# Patient Record
Sex: Male | Born: 1950
Health system: Southern US, Community
[De-identification: ages and names within clinical notes are randomized; demographics above are authoritative.]

## PROBLEM LIST (undated history)

## (undated) DIAGNOSIS — Z8719 Personal history of other diseases of the digestive system: Secondary | ICD-10-CM

## (undated) DIAGNOSIS — H269 Unspecified cataract: Secondary | ICD-10-CM

## (undated) DIAGNOSIS — R7303 Prediabetes: Secondary | ICD-10-CM

## (undated) DIAGNOSIS — N529 Male erectile dysfunction, unspecified: Secondary | ICD-10-CM

## (undated) DIAGNOSIS — Z87438 Personal history of other diseases of male genital organs: Secondary | ICD-10-CM

## (undated) DIAGNOSIS — K222 Esophageal obstruction: Secondary | ICD-10-CM

## (undated) DIAGNOSIS — D126 Benign neoplasm of colon, unspecified: Secondary | ICD-10-CM

## (undated) DIAGNOSIS — R7989 Other specified abnormal findings of blood chemistry: Secondary | ICD-10-CM

## (undated) DIAGNOSIS — R0602 Shortness of breath: Secondary | ICD-10-CM

## (undated) DIAGNOSIS — K219 Gastro-esophageal reflux disease without esophagitis: Secondary | ICD-10-CM

## (undated) DIAGNOSIS — E785 Hyperlipidemia, unspecified: Secondary | ICD-10-CM

## (undated) HISTORY — PX: ESOPHAGEAL DILATION: SHX303

## (undated) HISTORY — DX: Prediabetes: R73.03

## (undated) HISTORY — DX: Esophageal obstruction: K22.2

## (undated) HISTORY — DX: Other specified abnormal findings of blood chemistry: R79.89

## (undated) HISTORY — DX: Gastro-esophageal reflux disease without esophagitis: K21.9

## (undated) HISTORY — DX: Benign neoplasm of colon, unspecified: D12.6

## (undated) HISTORY — DX: Hyperlipidemia, unspecified: E78.5

## (undated) HISTORY — DX: Unspecified cataract: H26.9

## (undated) HISTORY — PX: CHOLECYSTECTOMY: SHX55

## (undated) HISTORY — DX: Personal history of other diseases of male genital organs: Z87.438

## (undated) HISTORY — DX: Personal history of other diseases of the digestive system: Z87.19

## (undated) HISTORY — DX: Male erectile dysfunction, unspecified: N52.9

---

## 1987-01-11 ENCOUNTER — Encounter: Payer: Self-pay | Admitting: Family Medicine

## 1987-01-11 LAB — CONVERTED CEMR LAB
Blood Glucose, Fasting: 96 mg/dL
WBC, blood: 5.6 10*3/uL

## 1988-02-14 ENCOUNTER — Encounter: Payer: Self-pay | Admitting: Family Medicine

## 1992-06-18 HISTORY — PX: BACK SURGERY: SHX140

## 1993-09-25 ENCOUNTER — Encounter: Payer: Self-pay | Admitting: Family Medicine

## 1993-09-25 LAB — CONVERTED CEMR LAB: PSA: 0.2 ng/mL

## 1996-12-27 HISTORY — PX: ESOPHAGOGASTRODUODENOSCOPY: SHX1529

## 1998-12-03 ENCOUNTER — Emergency Department (HOSPITAL_COMMUNITY): Admission: EM | Admit: 1998-12-03 | Discharge: 1998-12-03 | Payer: Self-pay | Admitting: Emergency Medicine

## 1998-12-05 ENCOUNTER — Encounter: Payer: Self-pay | Admitting: Family Medicine

## 2002-07-01 ENCOUNTER — Encounter: Payer: Self-pay | Admitting: Family Medicine

## 2002-07-01 LAB — CONVERTED CEMR LAB
Blood Glucose, Fasting: 97 mg/dL
PSA: 0.1 ng/mL
TSH: 3.83 microintl units/mL

## 2003-06-19 HISTORY — PX: COLONOSCOPY: SHX174

## 2003-10-21 ENCOUNTER — Ambulatory Visit (HOSPITAL_COMMUNITY): Admission: RE | Admit: 2003-10-21 | Discharge: 2003-10-21 | Payer: Self-pay | Admitting: Gastroenterology

## 2003-10-21 LAB — HM COLONOSCOPY

## 2004-05-04 ENCOUNTER — Ambulatory Visit: Payer: Self-pay | Admitting: Internal Medicine

## 2004-06-06 ENCOUNTER — Ambulatory Visit: Payer: Self-pay | Admitting: Family Medicine

## 2005-04-25 ENCOUNTER — Ambulatory Visit: Payer: Self-pay | Admitting: Family Medicine

## 2005-05-14 ENCOUNTER — Ambulatory Visit: Payer: Self-pay | Admitting: Family Medicine

## 2005-09-18 ENCOUNTER — Ambulatory Visit: Payer: Self-pay | Admitting: Gastroenterology

## 2006-01-30 ENCOUNTER — Ambulatory Visit: Payer: Self-pay | Admitting: Family Medicine

## 2006-08-15 ENCOUNTER — Ambulatory Visit: Payer: Self-pay | Admitting: Gastroenterology

## 2006-08-20 ENCOUNTER — Ambulatory Visit: Payer: Self-pay | Admitting: Family Medicine

## 2006-09-12 ENCOUNTER — Ambulatory Visit: Payer: Self-pay | Admitting: Family Medicine

## 2006-09-13 ENCOUNTER — Ambulatory Visit: Payer: Self-pay | Admitting: Family Medicine

## 2006-09-16 ENCOUNTER — Ambulatory Visit: Payer: Self-pay | Admitting: Family Medicine

## 2007-07-07 ENCOUNTER — Telehealth (INDEPENDENT_AMBULATORY_CARE_PROVIDER_SITE_OTHER): Payer: Self-pay | Admitting: *Deleted

## 2007-07-08 ENCOUNTER — Ambulatory Visit: Payer: Self-pay | Admitting: Family Medicine

## 2007-11-07 ENCOUNTER — Encounter: Payer: Self-pay | Admitting: Family Medicine

## 2007-11-07 DIAGNOSIS — N529 Male erectile dysfunction, unspecified: Secondary | ICD-10-CM | POA: Insufficient documentation

## 2007-11-07 DIAGNOSIS — F528 Other sexual dysfunction not due to a substance or known physiological condition: Secondary | ICD-10-CM | POA: Insufficient documentation

## 2007-11-07 DIAGNOSIS — Z87448 Personal history of other diseases of urinary system: Secondary | ICD-10-CM | POA: Insufficient documentation

## 2007-11-18 ENCOUNTER — Ambulatory Visit: Payer: Self-pay | Admitting: Family Medicine

## 2007-11-18 LAB — CONVERTED CEMR LAB
ALT: 22 units/L (ref 0–53)
CO2: 28 meq/L (ref 19–32)
Calcium: 9.1 mg/dL (ref 8.4–10.5)
Creatinine, Ser: 1.1 mg/dL (ref 0.4–1.5)
HDL: 40.2 mg/dL (ref >39.0)
LDL Cholesterol: 126 mg/dL — ABNORMAL HIGH (ref 0–99)
PSA: 0.15 ng/mL (ref 0.10–4.00)
Total Bilirubin: 0.9 mg/dL (ref 0.3–1.2)
Total CHOL/HDL Ratio: 4.6
Triglycerides: 101 mg/dL (ref 0–149)
VLDL: 20 mg/dL (ref 0–40)

## 2007-11-20 ENCOUNTER — Ambulatory Visit: Payer: Self-pay | Admitting: Family Medicine

## 2007-11-20 DIAGNOSIS — K219 Gastro-esophageal reflux disease without esophagitis: Secondary | ICD-10-CM | POA: Insufficient documentation

## 2007-11-20 DIAGNOSIS — D485 Neoplasm of uncertain behavior of skin: Secondary | ICD-10-CM | POA: Insufficient documentation

## 2008-04-15 ENCOUNTER — Ambulatory Visit: Payer: Self-pay | Admitting: Family Medicine

## 2008-04-21 ENCOUNTER — Telehealth: Payer: Self-pay | Admitting: Family Medicine

## 2008-09-14 ENCOUNTER — Telehealth: Payer: Self-pay | Admitting: Family Medicine

## 2009-05-19 ENCOUNTER — Encounter: Payer: Self-pay | Admitting: Family Medicine

## 2009-06-13 ENCOUNTER — Ambulatory Visit: Payer: Self-pay | Admitting: Family Medicine

## 2009-06-14 ENCOUNTER — Telehealth: Payer: Self-pay | Admitting: Family Medicine

## 2009-07-21 ENCOUNTER — Ambulatory Visit: Payer: Self-pay | Admitting: Family Medicine

## 2009-07-26 ENCOUNTER — Telehealth: Payer: Self-pay | Admitting: Family Medicine

## 2009-09-09 ENCOUNTER — Telehealth: Payer: Self-pay | Admitting: Gastroenterology

## 2009-09-15 ENCOUNTER — Telehealth: Payer: Self-pay | Admitting: Family Medicine

## 2010-01-18 ENCOUNTER — Encounter (INDEPENDENT_AMBULATORY_CARE_PROVIDER_SITE_OTHER): Payer: Self-pay | Admitting: *Deleted

## 2010-01-26 ENCOUNTER — Ambulatory Visit: Payer: Self-pay | Admitting: Internal Medicine

## 2010-01-26 DIAGNOSIS — E663 Overweight: Secondary | ICD-10-CM | POA: Insufficient documentation

## 2010-01-27 LAB — CONVERTED CEMR LAB
CO2: 30 meq/L (ref 19–32)
Calcium: 9 mg/dL (ref 8.4–10.5)
GFR calc non Af Amer: 78.03 mL/min (ref >60)
HDL: 50 mg/dL (ref >39.00)
PSA: 0.15 ng/mL (ref 0.10–4.00)
Sodium: 140 meq/L (ref 135–145)
Triglycerides: 121 mg/dL (ref 0.0–149.0)

## 2010-02-09 ENCOUNTER — Ambulatory Visit: Payer: Self-pay | Admitting: Internal Medicine

## 2010-02-09 DIAGNOSIS — N4889 Other specified disorders of penis: Secondary | ICD-10-CM | POA: Insufficient documentation

## 2010-07-20 NOTE — Assessment & Plan Note (Signed)
Summary: CHEST COLD  CYD   Vital Signs:  Patient profile:   60 year old male Weight:      220.75 pounds O2 Sat:      95 % on Room air Temp:     98.1 degrees F oral Pulse rate:   80 / minute Pulse rhythm:   regular BP sitting:   120 / 82  (left arm) Cuff size:   large  Vitals Entered By: Sydell Axon LPN (July 21, 2009 12:26 PM)  O2 Flow:  Room air CC: Productive cough/white, chest congestion and some SOB   History of Present Illness: Pt he for ST since Sun. He has had mild fever Mon, no chills. Throat hurts with coughing and will eventually make head hurt with coughing. His head hurts after the coughing in the temples. He denies ear pain but has constant ringing intead of occas. Hre has had no rhinitis, no nasal congestion except in the morning. Cough occas in spells since initially. He has taken Tyl and generic Mucinex. He is sleeping ok, wakes up once at night.  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 Take One By Mouth 1 Hour Prior To Intercourse 2)  Nexium 40 Mg  Cpdr (Esomeprazole Magnesium) .Marland Kitchen.. 1 Daily 3)  Multivitamins   Tabs (Multiple Vitamin) .Marland Kitchen.. 1 Daily By Mouth 4)  Promethazine Hcl 25 Mg Tabs (Promethazine Hcl) .... One Tab By Mouth Every 6 Hrs As Needed Nausea.  Allergies: No Known Drug Allergies  Physical Exam  General:  Well-developed,well-nourished,in mild  distress due to what looks to be mild dehydration; alert,appropriate and cooperative throughout examination, congested. Head:  Normocephalic and atraumatic without obvious abnormalities. No apparent alopecia or balding. Sinuses NT. Eyes:  Conjunctiva clear  bilaterally.  Ears:  External ear exam shows no significant lesions or deformities.  Otoscopic examination reveals clear canals, tympanic membranes are intact bilaterally without bulging, retraction, inflammation or discharge. Hearing is grossly normal bilaterally. Nose:  no airflow obstruction, mucosal erythema, and mucosal edema.    Mouth:  no exudates and pharyngeal erythema.  Reasonable mucous membr moistness. Slight white PND. Neck:  No deformities, masses, or tenderness noted. Lungs:  moist harsh cough with crackles throughout, worst in the bases... no wheezes.   Heart:  Normal rate and regular rhythm. S1 and S2 normal without gallop, murmur, click, rub or other extra sounds.   Impression & Recommendations:  Problem # 1:  BRONCHITIS- ACUTE (ICD-466.0) Assessment New  See instructions. His updated medication list for this problem includes:    Guaifenesin 400 Mg Tabs (Guaifenesin) .Marland Kitchen... Take 2 by mouth in am, 1 in the afternoon and 2 at night    Zithromax Z-pak 250 Mg Tabs (Azithromycin) .Marland Kitchen... As dir    Tessalon 200 Mg Caps (Benzonatate) ..... One tab by mouth three times a day  Take antibiotics and other medications as directed. Encouraged to push clear liquids, get enough rest, and take acetaminophen as needed. To be seen in 5-7 days if no improvement, sooner if worse.  Complete Medication List: 1)  Cialis 20 Mg Tabs (Tadalafil) .... Take one by mouth 1 hour prior to intercourse 2)  Nexium 40  Mg Cpdr (Esomeprazole magnesium) .Marland Kitchen.. 1 daily 3)  Multivitamins Tabs (Multiple vitamin) .Marland Kitchen.. 1 daily by mouth 4)  Guaifenesin 400 Mg Tabs (Guaifenesin) .... Take 2 by mouth in am, 1 in the afternoon and 2 at night 5)  Zithromax Z-pak 250 Mg Tabs (Azithromycin) .... As dir 6)  Tessalon 200 Mg Caps (Benzonatate) .... One tab by mouth three times a day   Patient Instructions: 1)  Take Zithromax. 2)  Take Guaifenesin by [facility] and getting MUCOUS RELIEF  EXPECTORANT (400mg ), take 11/2 tabs by mouth AM and NOON. 3)  Drink lots of fluids anytime taking Guaifenesin.  4)  Lozenge all day. Prescriptions: TESSALON 200 MG CAPS (BENZONATATE) one tab by mouth three times a day  #30 x 0   Entered and Authorized by:   Shaune Leeks MD   Signed by:   Shaune Leeks MD on 07/21/2009   Method used:   Electronically to        [facility]. [address] [phone]* (retail)       [address] [place], [place]  [id number]       Ph: [phone]       Fax: [phone]   RxID:   [id number] ZITHROMAX Z-PAK 250 MG TABS (AZITHROMYCIN) as dir  #1 x 0   Entered and Authorized by:   Shaune Leeks MD   Signed by:   Shaune Leeks MD on 07/21/2009   Method used:   Electronically to        [facility]. [address] [phone]* (retail)       [address] [place], [place]  [address]       Ph: [phone]       Fax: [phone]   RxID:   [id number]   Current Allergies (reviewed today): No known allergies

## 2010-07-20 NOTE — Assessment & Plan Note (Signed)
Summary: CPX/DR SCHALLER'S PT/CLE   Vital Signs:  Patient profile:   60 year old male Height:      74 inches Weight:      220 pounds BMI:     28.35 Temp:     98.3 degrees F oral Pulse rate:   68 / minute Pulse rhythm:   regular BP sitting:   104 / 68  (left arm) Cuff size:   large  Vitals Entered By: Selena Batten Dance CMA (AAMA) (January 26, 2010 8:20 AM) CC: CPx   History of Present Illness: CC: CPE  no concerns today.  1. GERD with h/o hiatal hernia - on nexium, controls very well.  Does have episode maybe twice a year where his stomache feels full, improves with glass of water or antacid.  2. ED - cialis as needed, doing well with this.  3. preventative - UTD on colonosocpy and tetanus, due for prostate check (always normal).  Preventive Screening-Counseling & Management  Alcohol-Tobacco     Alcohol drinks/day: 0     Smoking Status: quit     Cans of tobacco/week: used to smokeless  Caffeine-Diet-Exercise     Caffeine use/day: <1cup/day      Drug Use:  never.        Blood Transfusions:  no.    Current Medications (verified): 1)  Cialis 20 Mg  Tabs (Tadalafil) .... Take One By Mouth 1 Hour Prior To Intercourse 2)  Nexium 40 Mg  Cpdr (Esomeprazole Magnesium) .Marland Kitchen.. 1 Daily 3)  Multivitamins   Tabs (Multiple Vitamin) .Marland Kitchen.. 1 Daily By Mouth  Allergies (verified): No Known Drug Allergies  Past History:  Past medical, surgical, family and social histories (including risk factors) reviewed for relevance to current acute and chronic problems.  Past Medical History: ED GERD h/o prostatitis in past  Past Surgical History: Reviewed history from 11/20/2007 and no changes required. HOSP TESTICULAR TORSION NO ORCHIOPEXY  CHILDHOOD BACK SURGERY, LUMBAR AREA  1994 EGD-- HIATAL HERNIA:(12/28/1986) EGD ESOPH STRICTURE, DILATED X 3 LAST 1993 EGD-- GERD--STRICTURE DILATED DUODENITIS:(10/06/2003) COLONOSCOPY  --NORMAL:(10/21/2003)     10 yrs  Family History: Reviewed history  from 11/20/2007 and no changes required. Father:ALIVE  83  BLEEDING ULCERS//CABG X 3 VESSELS MI X2  Mother: ALIVE  17 ARRYTHMIA AORTIC VALVE DISEASE // GERD  BROTHER A 57 SISTER A 52 Sister A 48 CV: + FATHER  HBP: NEGATIVE DM: NEGATIVE GOUT ARTHRITIS: PROSTATE CANCER:NEGATIVE BREAST/OVARIAN/UTERINE CANCER: NEGATIVE COLON CANCER: NEGATIVE DEPRESSION: ETOH/DRUG ABUSE: OTHER: NEGATIVE STROKE  No CA  Social History: Reviewed history from 11/07/2007 and no changes required. 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    Physical Exam  General:  Well-developed,well-nourished,in no acute distress; alert,appropriate and cooperative throughout examination Head:  Normocephalic and atraumatic without obvious abnormalities. No apparent alopecia or balding. Eyes:  No corneal or conjunctival inflammation noted. EOMI. Perrla. Mouth:  Oral mucosa and oropharynx without lesions or exudates.  Teeth in good repair. Neck:  No deformities, masses, or tenderness noted. Lungs:  Normal respiratory effort, chest  expands symmetrically. Lungs are clear to auscultation, no crackles or wheezes. Heart:  Normal rate and regular rhythm. S1 and S2 normal without gallop, murmur, click, rub or other extra sounds. Rectal:  No external abnormalities noted. Normal sphincter tone. No rectal masses or  tenderness. Prostate:  Prostate gland firm and smooth, no enlargement, nodularity, tenderness, mass, asymmetry or induration.  gland 10-20gm Msk:  No deformity or scoliosis noted of thoracic or lumbar spine.   Pulses:  2+ periph pulses Extremities:  No clubbing, cyanosis, edema, or deformity noted with normal full range of motion of all joints.   Skin:  Intact without suspicious lesions or rashes.  + sebaceous cyst left upper back.   Impression & Recommendations:  Problem # 1:  HEALTH MAINTENANCE EXAM (ICD-V70.0)  Reviewed preventive care protocols, scheduled due services, and updated immunizations.  Problem # 2:  OVERWEIGHT (ICD-278.02) increased exercise, check HDL.  Orders: TLB-BMP (Basic Metabolic Panel-BMET) (80048-METABOL) TLB-Lipid Panel (80061-LIPID)  Ht: 74 (01/26/2010)   Wt: 220 (01/26/2010)   BMI: 28.35 (01/26/2010)  Problem # 3:  GASTROESOPHAGEAL REFLUX DISEASE (ICD-530.81) continue nexium for now  His updated medication list for this problem includes:    Nexium 40 Mg Cpdr (Esomeprazole magnesium) .Marland Kitchen... 1 daily  Problem # 4:  ERECTILE DYSFUNCTION (ICD-302.72) refilled cialis  His updated medication list for this problem includes:    Cialis 20 Mg Tabs (Tadalafil) .Marland Kitchen... Take one by mouth 1 hour prior to intercourse  Problem # 5:  SPECIAL SCREENING MALIGNANT NEOPLASM OF PROSTATE (ICD-V76.44) DRE WNL Orders: TLB-PSA (Prostate Specific Antigen) (84153-PSA)  Complete Medication List: 1)  Cialis 20 Mg Tabs (Tadalafil) .... Take one by mouth 1 hour prior to intercourse 2)  Nexium 40 Mg Cpdr (Esomeprazole magnesium) .Marland Kitchen.. 1 daily 3)  Multivitamins Tabs (Multiple vitamin) .Marland Kitchen.. 1 daily by mouth  Other Orders: Hemoccult Guaiac-1 spec.(in office) ([address])  Patient Instructions: 1)  Please return in 1 year, or as needed. 2)  We will check blood work today. 3)  Prostate looks normal. 4)  Call clinic with questions. Pleasure to meet you today.l Prescriptions: NEXIUM 40  MG  CPDR (ESOMEPRAZOLE MAGNESIUM) 1 DAILY  #90 x 3   Entered and Authorized by:   Eustaquio Boyden  MD   Signed by:   Eustaquio Boyden  MD on 01/26/2010   Method used:   Print then Give to Patient   RxID:   [id number] CIALIS 20 MG  TABS (TADALAFIL) Take one by mouth 1 hour prior to intercourse  #9 Tablet x 11   Entered and Authorized by:   Eustaquio Boyden  MD   Signed by:   Eustaquio Boyden  MD on 01/26/2010   Method used:   Electronically to        [facility]. [address] [phone]* (retail)       [address] [place], [place]  [id number]       Ph: [phone]       Fax: [phone]   RxID:   [id number]   Current Allergies (reviewed today): No known allergies    Prevention & Chronic Care Immunizations   Influenza vaccine: Historical  (05/19/2009)   Influenza vaccine due: 02/16/2010    Tetanus booster: 07/07/2002: Td   Tetanus booster due: 07/07/2012    Pneumococcal vaccine: Not documented  Colorectal Screening   Hemoccult: Positive  (07/24/2002)   Hemoccult action/deferral: Not indicated  (01/26/2010)    Colonoscopy: Done  (10/21/2003)   Colonoscopy due: 10/20/2013  Other Screening   PSA: 0.15  (11/18/2007)   PSA ordered.   Smoking status: quit  (01/26/2010)  Lipids   Total Cholesterol: 186  (11/18/2007)   LDL: 126  (11/18/2007)   LDL Direct: Not documented   HDL: 40.2  (11/18/2007)   Triglycerides: 101  (11/18/2007)

## 2010-07-20 NOTE — Assessment & Plan Note (Signed)
Summary: 8:45 ?TICK BITE/CLE   Vital Signs:  Patient profile:   60 year old male Weight:      218.75 pounds Temp:     98.3 degrees F oral Pulse rate:   78 / minute Pulse rhythm:   regular BP sitting:   110 / 80  (left arm) Cuff size:   large  Vitals Entered By: Selena Batten Dance CMA Duncan Dull) (February 09, 2010 8:55 AM) CC: check tick bite   History of Present Illness: CC: tick bite  Tick bite on penis.  Didn't notice 2 nights ago shower, then 4:30am yesterday morning took off (<24 hours).  Now notes swollen penis.  voiding fine.    + swelling and itching where he removed tick.  ? about head remaining.  Allergies: No Known Drug Allergies  Review of Systems       per HPI.  no difficutly voiding.    Physical Exam  General:  Well-developed,well-nourished,in no acute distress; alert,appropriate and cooperative throughout examination Genitalia:  circumcised small puncture mark left dorsal glans, no evidence of retained foreign body. + significant swelling of penis circunferential around shaft below glans Skin:  Intact without suspicious lesions or rashes   Impression & Recommendations:  Problem # 1:  EDEMA OF PENIS (ICD-607.83) presumed 2/2 rxn to tick bite on glans of penis.  treat with short course of oral steroids, triamcinolone cream x 1 wk for itch, cold compresses/frozen vegetable bag to area for next few days.  red flags to call or return discussed including inabilty to void or worsening instead of improving.  Complete Medication List: 1)  Cialis 20 Mg Tabs (Tadalafil) .... Take one by mouth 1 hour prior to intercourse 2)  Nexium 40 Mg Cpdr (Esomeprazole magnesium) .Marland Kitchen.. 1 daily 3)  Multivitamins Tabs (Multiple vitamin) .Marland Kitchen.. 1 daily by mouth 4)  Prednisone 20 Mg Tabs (Prednisone) .... One by mouth two times a day x 5 days 5)  Triamcinolone Acetonide 0.1 % Crea (Triamcinolone acetonide) .... Apply to affected area daily, small op  Patient Instructions: 1)  swelling local  reaction to tick bite. 2)  Use ice to area, stay off feet if able, course of steroids for next 5 days. 3)  Triamcinolone cream for next 5-7 days once daily for itch. 4)  Good to see you today. 5)  Call us if trouble voiding, swelling worsening, or any other concerns. Prescriptions: TRIAMCINOLONE ACETONIDE 0.1 % CREA (TRIAMCINOLONE ACETONIDE) apply to affected area daily, small OP  #1 x 0   Entered and Authorized by:   Eustaquio Boyden  MD   Signed by:   Eustaquio Boyden  MD on 02/09/2010   Method used:   Electronically to        [facility]. [address] [phone]* (retail)       [address] [place], [place]  [address]       Ph: [phone]       Fax: [phone]   RxID:   [id number] PREDNISONE 20 MG TABS (PREDNISONE) one by mouth two times a day x 5 days  #10 x 0   Entered and Authorized by:   Eustaquio Boyden  MD   Signed by:   Eustaquio Boyden  MD on 02/09/2010   Method used:   Electronically to        [facility]. [employer] [phone]* (retail)       [address] [place], [place]  [id number]       Ph: [phone]       Fax: [phone]   RxID:   [id number]   Current Allergies (reviewed today): No known allergies

## 2010-07-20 NOTE — Progress Notes (Signed)
Summary: still has congestion and cough  Phone Note Call from Patient Call back at Work Phone [phone]   Caller: Patient Call For: Shaune Leeks MD Summary of Call: Pt was treated for bronchitis and finished his z-pack yesterday.  Has a dry cough and sinus congestion.  Advised him that the zithromax will continue to work in his system but he is asking if he can have something to break up the congestion.  Tesselon and mucinex are not helping.  He says he is drinking plenty of fluids.  [facility]ses [facility] [address]  Please advise. Initial call taken by: Lowella Petties CMA,  July 26, 2009 10:23 AM  Follow-up for Phone Call        He's not supposed to be taking Mucinex..Take Guaifenesin by [facility] and getting MUCOUS RELIEF EXPECTORANT (400mg ), take 11/2 tabs by mouth AM and NOON. Drink lots of fluids anytime taking Guaifenesin.  Follow-up by: Shaune Leeks MD,  July 26, 2009 10:30 AM  Additional Follow-up for Phone Call Additional follow up Details #1::        Advised pt, he actually is taking the mucous relief expectrorant from [facility].  He will continue this and will call back if he continues to have problems. Additional Follow-up by: Lowella Petties CMA,  July 26, 2009 10:50 AM

## 2010-07-20 NOTE — Progress Notes (Signed)
Summary: refills  Medications Added NEXIUM 40 MG  CPDR (ESOMEPRAZOLE MAGNESIUM) 1 DAILY       Phone Note Call from Patient Call back at Work Phone [phone]   Caller: Patient Call For: Jarold Motto Reason for Call: Refill Medication, Talk to Nurse Summary of Call: Patient needs refills for his meds Nexium for 3 months supply Initial call taken by: Tawni Levy,  September 09, 2009 1:26 PM  Follow-up for Phone Call        Rx sent. Follow-up by: Ashok Cordia RN,  September 09, 2009 2:21 PM    New/Updated Medications: NEXIUM 40 MG  CPDR (ESOMEPRAZOLE MAGNESIUM) 1 DAILY Prescriptions: NEXIUM 40 MG  CPDR (ESOMEPRAZOLE MAGNESIUM) 1 DAILY  #90 x 1   Entered by:   Ashok Cordia RN   Authorized by:   Mardella Layman MD [facility]   Signed by:   Ashok Cordia RN on 09/09/2009   Method used:   Electronically to        [facility]. [address] [phone]* (retail)       [address] [place], [place]  [id number]       Ph: [phone]       Fax: [phone]   RxID:   [id number]

## 2010-07-20 NOTE — Progress Notes (Signed)
Summary: refill request for nexium  Phone Note Refill Request Call back at Work Phone [phone] Message from:  Patient  Refills Requested: Medication #1:  NEXIUM 40 MG  CPDR 1 DAILY Pt needs written 90 day script for mail order, please call when ready.  [facility] but pt cancelled script there.  Initial call taken by: Lowella Petties CMA,  September 15, 2009 9:43 AM  Follow-up for Phone Call        Pt to pick up script. Follow-up by: Lowella Petties CMA,  September 15, 2009 10:10 AM    Prescriptions: NEXIUM 40 MG  CPDR (ESOMEPRAZOLE MAGNESIUM) 1 DAILY  #90 x 1   Entered and Authorized by:   Shaune Leeks MD   Signed by:   Shaune Leeks MD on 09/15/2009   Method used:   Printed then faxed to ...       [facility] [address] [phone]* (retail)       [address] [place], [place]  [address]       Ph: [phone]       Fax: [phone]   RxID:   [id number]

## 2010-07-20 NOTE — Letter (Signed)
Summary: Nadara Eaton letter  [place] [facility]  [address] [place], [place] [address]   Phone: [phone]  Fax: [phone]       01/18/2010 MRN: [id number]  KAHIAU SCHEWE [address] [place], [place]  [address]  Dear Mr. MODI,  [facility] [place], and [facility] announce the retirement of Arta Silence, M.D., from full-time practice at the [facility] office effective December 15, 2009 and his plans of returning part-time.  It is important to Dr. Hetty Ely and to our practice that you understand that [facility] has seven physicians in our office for your health care needs.  We will continue to offer the same exceptional care that you have today.    Dr. Hetty Ely has spoken to many of you about his plans for retirement and returning part-time in the fall.   We will continue to work with you through the transition to schedule appointments for you in the office and meet the high standards that [facility] is committed to.   Again, it is with great pleasure that we share the news that Dr. Hetty Ely will return to [facility] in October of 2011 with a reduced schedule.    If you have any questions, or would like to request an appointment with one of our physicians, please call us at [phone] and press the option for Scheduling an appointment.  We take pleasure in providing you with excellent patient care and look forward to seeing you at your next office visit.  Our [facility] Physicians are:  Tillman Abide, M.D. Laurita Quint, M.D. Roxy Manns, M.D. Kerby Nora, M.D. Hannah Beat, M.D. Ruthe Mannan, M.D. We proudly welcomed Raechel Ache, M.D. and Eustaquio Boyden, M.D. to the practice in July/August 2011.  Sincerely,   [facility]

## 2010-11-03 NOTE — Assessment & Plan Note (Signed)
[facility]                         GASTROENTEROLOGY OFFICE NOTE   NAME:ALCONAbdoul, Encinas                      MRN:          [id number]  DATE:08/15/2006                            DOB:          [birth year]    August 15, 2006   Dorene Sorrow has chronic acid reflux and takes daily Nexium 40 mg.  His  prescription ran out and he comes in for refill.  As long as he takes  his medication, he has no problems with reflux or dysphagia.  He has had  previous peptic strictures of his esophagus that require dilatation with  last endoscopy and colonoscopy performed in April 2005.  Patient has  also had 24-hour pH probe testing which is documented chronic acid  reflux changes with much of his acid reflux in the upright daytime  position.  Previous biopsies have not shown evidence of Barrett's  mucosa.   Patient otherwise is doing well and has complete exams with Dr. Hetty Ely  every two years and is due for an exam at this time.  The only other  medication is multivitamins daily.  He really denies any chronic medical  problems.   FAMILY HISTORY:  Noncontributory.   SOCIAL HISTORY:  He is married and lives wit his wife.  He has a high  school education and works in Aeronautical engineer.  He does chew tobacco but  denies abuse of cigarettes or alcohol.   REVIEW OF SYSTEMS:  Noncontributory.   PHYSICAL EXAMINATION:  GENERAL APPEARANCE:  Patient is awake and alert,  in no acute distress.  VITAL SIGNS:  He weighs 210 pounds and blood pressure is 100/62, pulse  76 and regular.  General physical examination was not performed.   ASSESSMENT:  Mr. Hyun acid reflux is well controlled with daily  maintenance proton pump inhibitor therapy.   RECOMMENDATIONS:  I renewed his prescription and will see him as needed  in the future.  I have asked him to have his blood work performed with  Dr. Hetty Ely since it has been two years since this has been performed.  He otherwise is doing  well and I will see him on a p.r.n. basis.     Vania Rea. Jarold Motto, MD, Caleen Essex, FAGA  Electronically Signed    DRP/MedQ  DD: 08/15/2006  DT: 08/15/2006  Job #: [id number]

## 2011-01-19 ENCOUNTER — Other Ambulatory Visit: Payer: Self-pay | Admitting: Family Medicine

## 2011-01-19 DIAGNOSIS — Z125 Encounter for screening for malignant neoplasm of prostate: Secondary | ICD-10-CM

## 2011-01-19 DIAGNOSIS — E663 Overweight: Secondary | ICD-10-CM

## 2011-01-19 DIAGNOSIS — Z0000 Encounter for general adult medical examination without abnormal findings: Secondary | ICD-10-CM

## 2011-01-26 ENCOUNTER — Encounter: Payer: Self-pay | Admitting: Family Medicine

## 2011-01-26 ENCOUNTER — Other Ambulatory Visit (INDEPENDENT_AMBULATORY_CARE_PROVIDER_SITE_OTHER): Payer: BC Managed Care – PPO | Admitting: Family Medicine

## 2011-01-26 ENCOUNTER — Ambulatory Visit (INDEPENDENT_AMBULATORY_CARE_PROVIDER_SITE_OTHER): Payer: BC Managed Care – PPO | Admitting: Family Medicine

## 2011-01-26 DIAGNOSIS — Z0000 Encounter for general adult medical examination without abnormal findings: Secondary | ICD-10-CM

## 2011-01-26 DIAGNOSIS — Z125 Encounter for screening for malignant neoplasm of prostate: Secondary | ICD-10-CM

## 2011-01-26 DIAGNOSIS — R079 Chest pain, unspecified: Secondary | ICD-10-CM

## 2011-01-26 DIAGNOSIS — E663 Overweight: Secondary | ICD-10-CM

## 2011-01-26 LAB — BASIC METABOLIC PANEL
BUN: 14 mg/dL (ref 6–23)
GFR: 86.31 mL/min (ref >60.00)
Potassium: 4.6 mEq/L (ref 3.5–5.1)
Sodium: 141 mEq/L (ref 135–145)

## 2011-01-26 LAB — LIPID PANEL
LDL Cholesterol: 99 mg/dL (ref 0–99)
Total CHOL/HDL Ratio: 3

## 2011-01-26 NOTE — Progress Notes (Signed)
  Subjective:    Patient ID: Mark Martinez, male    DOB: 1951-05-27, 60 y.o.   MRN: [id number]  HPI  Mark Martinez, a 60 y.o. male presents today in the office for the following:    I am asked to see the patient acutely when lab noticed he was having "chest pain" while getting blood draw.  Woke up with a deep reath, up and around shoulder blade, sneeze or hiccup will cause some pain. Shallow breathing. Pain around rib cage, under R axilla and pain with motion, coughing, hiccups and deep breath.   Normotensive. No prior heart conditions. No fever or chills.  The PMH, PSH, Social History, Family History, Medications, and allergies have been reviewed in [facility], and have been updated if relevant.  Review of Systems ROS: GEN: No acute illnesses, no fevers, chills. GI: No n/v/d, eating normally Pulm: No SOB Interactive and getting along well at home.  Otherwise, ROS is as per the HPI.     Objective:   Physical Exam   Physical Exam  Blood pressure 110/72, pulse 74, temperature 98.1 F (36.7 C), temperature source Oral, height 6\' 3"  (1.905 m), weight 220 lb 1.9 oz (99.846 kg), SpO2 96.00%.  GEN: WDWN, NAD, Non-toxic, A & O x 3 HEENT: Atraumatic, Normocephalic. Neck supple. No masses, No LAD. Ears and Nose: No external deformity. CV: RRR, No M/G/R. No JVD. No thrill. No extra heart sounds. PULM: CTA B, no wheezes, crackles, rhonchi. No retractions. No resp. distress. No accessory muscle use. MSK: TTP along anterior ribs around 6 and around under axilla, pain with compression EXTR: No c/c/e NEURO Normal gait.  PSYCH: Normally interactive. Conversant. Not depressed or anxious appearing.  Calm demeanor.        Assessment & Plan:   1. Chest pain  Electrocardiogram report    EKG: Normal sinus rhythm. Normal axis, normal R wave progression, No acute ST elevation or depression.  Reassured. Costochondritis.  Alleve 2 tabs by mouth two tmes a day over the counter: Take at least for  2 - 3 weeks. This is equal to a prescripton strength dose Heat, ROM

## 2011-01-31 ENCOUNTER — Ambulatory Visit (INDEPENDENT_AMBULATORY_CARE_PROVIDER_SITE_OTHER): Payer: BC Managed Care – PPO | Admitting: Family Medicine

## 2011-01-31 ENCOUNTER — Encounter: Payer: Self-pay | Admitting: Family Medicine

## 2011-01-31 VITALS — BP 112/74 | HR 64 | Temp 98.1°F | Ht 74.0 in | Wt 221.2 lb

## 2011-01-31 DIAGNOSIS — K219 Gastro-esophageal reflux disease without esophagitis: Secondary | ICD-10-CM

## 2011-01-31 DIAGNOSIS — E663 Overweight: Secondary | ICD-10-CM

## 2011-01-31 DIAGNOSIS — Z0000 Encounter for general adult medical examination without abnormal findings: Secondary | ICD-10-CM | POA: Insufficient documentation

## 2011-01-31 DIAGNOSIS — F528 Other sexual dysfunction not due to a substance or known physiological condition: Secondary | ICD-10-CM

## 2011-01-31 MED ORDER — TADALAFIL 20 MG PO TABS: 20.0000 mg | ORAL_TABLET | Freq: Every day | ORAL | Status: DC | PRN

## 2011-01-31 MED ORDER — ESOMEPRAZOLE MAGNESIUM 40 MG PO CPDR: 40.0000 mg | DELAYED_RELEASE_CAPSULE | Freq: Every day | ORAL | Status: DC

## 2011-01-31 NOTE — Assessment & Plan Note (Addendum)
Discussed weight and definitions based on BMI. Body mass index is 28.41 kg/(m^2).

## 2011-01-31 NOTE — Assessment & Plan Note (Signed)
Reviewed preventative protocols. Pt desires to continue screening prostate. UTD colon and tetanus.

## 2011-01-31 NOTE — Assessment & Plan Note (Signed)
Well controlled on nexium, refilled.

## 2011-01-31 NOTE — Assessment & Plan Note (Signed)
Refilled cialis, provided with voucher for 3 pills.

## 2011-01-31 NOTE — Patient Instructions (Signed)
Return in 1 year or as needed. cialis refilled. nexium refill provided. Good to see you today, call us with questions.

## 2011-01-31 NOTE — Progress Notes (Signed)
Subjective:    Patient ID: Mark Martinez, male    DOB: 1950-09-13, 60 y.o.   MRN: [id number]  HPI CC: CPE  Here for CPE today.  GERD - prior to nexium, did OTC meds but didn't help (prilosec and several others).  As well as possibly aciphex.  Great control on nexium.  Has been on this for 8-10 years.  cialis - requests refill, working well.  Uses intermittently.  Asks about samples or voucher.  Preventative: Tetanus 2004. Colonoscopy - 2005, rec rpt 10 years. Prostate - would like to continue screening.  Medications and allergies reviewed and updated in chart. Patient Active Problem List  Diagnoses  . NEOPLASM OF UNCERTAIN BEHAVIOR OF SKIN  . OVERWEIGHT  . ERECTILE DYSFUNCTION  . GASTROESOPHAGEAL REFLUX DISEASE  . EDEMA OF PENIS  . PROSTATITIS, HX OF   Past Medical History  Diagnosis Date  . GERD (gastroesophageal reflux disease)   . History of prostatitis   . ED (erectile dysfunction)    Past Surgical History  Procedure Date  . Back surgery 1994    lumbar area  . Esophagogastroduodenoscopy 12-27-1996    hiatal hernia  . Esophageal dilation      three last  . Colonoscopy 2005    WNL, rpt 10 yrs   History  Substance Use Topics  . Smoking status: Never Smoker   . Smokeless tobacco: Former Neurosurgeon    Quit date: 01/30/2009  . Alcohol Use: No   Family History  Problem Relation Age of Onset  . Heart disease Mother     faulty valve  . Ulcers Father   . Heart attack Father   . Heart disease Father 65    mult stents  . Cancer Neg Hx   . Diabetes Neg Hx   . Stroke Neg Hx    No Known Allergies Current Outpatient Prescriptions on File Prior to Visit  Medication Sig Dispense Refill  . Multiple Vitamin (MULTIVITAMIN) tablet Take 1 tablet by mouth daily.         Review of Systems  Constitutional: Negative for fever, chills, activity change, appetite change, fatigue and unexpected weight change.  HENT: Negative for hearing loss and neck pain.   Eyes: Negative for  visual disturbance.  Respiratory: Negative for cough, chest tightness, shortness of breath and wheezing.   Cardiovascular: Negative for chest pain, palpitations and leg swelling.  Gastrointestinal: Negative for nausea, vomiting, abdominal pain, diarrhea, constipation, blood in stool and abdominal distention.  Genitourinary: Negative for hematuria and difficulty urinating.  Musculoskeletal: Negative for myalgias and arthralgias.  Skin: Negative for rash.  Neurological: Negative for dizziness, seizures, syncope and headaches.  Hematological: Does not bruise/bleed easily.  Psychiatric/Behavioral: Negative for dysphoric mood. The patient is not nervous/anxious.       Objective:   Physical Exam  Nursing note and vitals reviewed. Constitutional: He is oriented to person, place, and time. He appears well-developed and well-nourished. No distress.  HENT:  Head: Normocephalic and atraumatic.  Right Ear: External ear normal.  Left Ear: External ear normal.  Nose: Nose normal.  Mouth/Throat: Oropharynx is clear and moist.  Eyes: Conjunctivae and EOM are normal. Pupils are equal, round, and reactive to light.  Neck: Normal range of motion. Neck supple. No thyromegaly present.  Cardiovascular: Normal rate, regular rhythm, normal heart sounds and intact distal pulses.   No murmur heard. Pulses:      Radial pulses are 2+ on the right side, and 2+ on the left side.  Pulmonary/Chest: Effort  normal and breath sounds normal. No respiratory distress. He has no wheezes. He has no rales.  Abdominal: Soft. Bowel sounds are normal. He exhibits no distension and no mass. There is no tenderness. There is no rebound and no guarding.  Musculoskeletal: Normal range of motion.  Lymphadenopathy:    He has no cervical adenopathy.  Neurological: He is alert and oriented to person, place, and time.       CN grossly intact, station and gait intact  Skin: Skin is warm and dry. No rash noted.  Psychiatric: He has a  normal mood and affect. His behavior is normal. Judgment and thought content normal.      Assessment & Plan:

## 2011-08-20 ENCOUNTER — Other Ambulatory Visit: Payer: Self-pay | Admitting: *Deleted

## 2011-08-20 MED ORDER — ESOMEPRAZOLE MAGNESIUM 40 MG PO CPDR: 40.0000 mg | DELAYED_RELEASE_CAPSULE | Freq: Every day | ORAL | Status: DC

## 2012-02-04 ENCOUNTER — Telehealth: Payer: Self-pay | Admitting: Family Medicine

## 2012-02-04 NOTE — Telephone Encounter (Signed)
Call Pharmacy for auth of Cialis voucher.  Thanks

## 2012-02-05 NOTE — Telephone Encounter (Signed)
Pt left v/m requesting call back about medicine.(no name of med left). I called and left v/m for pt to call back

## 2012-02-06 MED ORDER — TADALAFIL 20 MG PO TABS: 20.0000 mg | ORAL_TABLET | Freq: Every day | ORAL | Status: DC | PRN

## 2012-02-06 NOTE — Telephone Encounter (Signed)
Prior med. Sent in refills.  plz notify patient.

## 2012-02-06 NOTE — Telephone Encounter (Signed)
Pt has Cialis voucher and needs rx sent to [facility] [address]. For Cialis 5 mg # 30.Please advise.

## 2012-02-07 NOTE — Telephone Encounter (Signed)
Message left notifying patient.

## 2012-02-08 ENCOUNTER — Telehealth: Payer: Self-pay

## 2012-02-08 MED ORDER — TADALAFIL 5 MG PO TABS: 5.0000 mg | ORAL_TABLET | Freq: Every day | ORAL | Status: DC | PRN

## 2012-02-08 NOTE — Telephone Encounter (Signed)
Sent in

## 2012-02-08 NOTE — Telephone Encounter (Signed)
Sarah with [employer]. Request Cialis 5 mg # 30 sent to [facility]; pt has voucher for free # 30 of Cialis 5 mg. Maralyn Sago is aware pt has been taking 20 mg; but pt wants # 30 of 5 mg free.Please advise.

## 2012-03-07 ENCOUNTER — Ambulatory Visit (INDEPENDENT_AMBULATORY_CARE_PROVIDER_SITE_OTHER): Payer: BC Managed Care – PPO | Admitting: Family Medicine

## 2012-03-07 ENCOUNTER — Encounter: Payer: Self-pay | Admitting: Family Medicine

## 2012-03-07 ENCOUNTER — Telehealth: Payer: Self-pay | Admitting: Family Medicine

## 2012-03-07 VITALS — BP 132/80 | HR 80 | Temp 98.2°F | Wt 221.2 lb

## 2012-03-07 DIAGNOSIS — H00019 Hordeolum externum unspecified eye, unspecified eyelid: Secondary | ICD-10-CM

## 2012-03-07 MED ORDER — ERYTHROMYCIN 5 MG/GM OP OINT: TOPICAL_OINTMENT | OPHTHALMIC | Status: DC

## 2012-03-07 NOTE — Patient Instructions (Addendum)
You have stye.  Treat with warm compresses several times a day and erythromycin ointment at edge of lower eyelid If not improving, let us know for referral to eye doctor. Good to see you today, call us with questions.  Sty A sty (hordeolum) is an infection of a gland in the eyelid located at the base of the eyelash. A sty may develop a white or yellow head of pus. It can be puffy (swollen). Usually, the sty will burst and pus will come out on its own. They do not leave lumps in the eyelid once they drain. A sty is often confused with another form of cyst of the eyelid called a chalazion. Chalazions occur within the eyelid and not on the edge where the bases of the eyelashes are. They often are red, sore and then form firm lumps in the eyelid. CAUSES   Germs (bacteria).   Lasting (chronic) eyelid inflammation.  SYMPTOMS   Tenderness, redness and swelling along the edge of the eyelid at the base of the eyelashes.   Sometimes, there is a white or yellow head of pus. It may or may not drain.  DIAGNOSIS  An ophthalmologist will be able to distinguish between a sty and a chalazion and treat the condition appropriately.  TREATMENT   Styes are typically treated with warm packs (compresses) until drainage occurs.   In rare cases, medicines that kill germs (antibiotics) may be prescribed. These antibiotics may be in the form of drops, cream or pills.   If a hard lump has formed, it is generally necessary to do a small incision and remove the hardened contents of the cyst in a minor surgical procedure done in the office.   In suspicious cases, your caregiver may send the contents of the cyst to the lab to be certain that it is not a rare, but dangerous form of cancer of the glands of the eyelid.  HOME CARE INSTRUCTIONS   Wash your hands often and dry them with a clean towel. Avoid touching your eyelid. This may spread the infection to other parts of the eye.   Apply heat to your eyelid for 10 to  20 minutes, several times a day, to ease pain and help to heal it faster.   Do not squeeze the sty. Allow it to drain on its own. Wash your eyelid carefully 3 to 4 times per day to remove any pus.  SEEK IMMEDIATE MEDICAL CARE IF:   Your eye becomes painful or puffy (swollen).   Your vision changes.   Your sty does not drain by itself within 3 days.   Your sty comes back within a short period of time, even with treatment.   You have redness (inflammation) around the eye.   You have a fever.  Document Released: 03/14/2005 Document Revised: 05/24/2011 Document Reviewed: 11/16/2008 [facility] Patient Information 2012 [place], [place].

## 2012-03-07 NOTE — Telephone Encounter (Signed)
Patient notified and will return sample.

## 2012-03-07 NOTE — Progress Notes (Signed)
  Subjective:    Patient ID: Mark Martinez, male    DOB: [birth year], 61 y.o.   MRN: [id number]  HPI CC: stye  Right eye stye came  Up last week.  Started with whitehead.  Did drain some initially.  Prior treated with oral abx prescribed by eye doctor for stye on left side 6 mo ago.  Eye staying irritated and itchy.  Vision intact.  No pain with eye movements.  Past Medical History  Diagnosis Date  . GERD (gastroesophageal reflux disease)   . History of prostatitis   . ED (erectile dysfunction)     Review of Systems Per HPI    Objective:   Physical Exam  Nursing note and vitals reviewed. Constitutional: He appears well-developed and well-nourished. No distress.  HENT:  Head: Normocephalic and atraumatic.  Eyes: Conjunctivae normal and EOM are normal. Pupils are equal, round, and reactive to light.       R anterior lower eyelid with external hordeolum present, mild erythema surrounding eye. No pain with EOM. + palpebral conjunctival infection present lower eyelid.       Assessment & Plan:

## 2012-03-07 NOTE — Telephone Encounter (Signed)
I gave pt 20mg  sample of cialis, but he takes 5mg .  Can we have him come in to exchange for a 5mg  packet?  placed sample on kim's desk in yellow bag.

## 2012-03-07 NOTE — Assessment & Plan Note (Signed)
External sty.  Discussed treatment - see pt instructions. Update if not improving as expected.

## 2012-05-07 NOTE — Telephone Encounter (Signed)
Pt request call back re; samples to [phone].

## 2012-05-09 NOTE — Telephone Encounter (Signed)
Message left for patient to return my call.  

## 2012-05-13 ENCOUNTER — Encounter: Payer: Self-pay | Admitting: Family Medicine

## 2012-05-13 ENCOUNTER — Ambulatory Visit (INDEPENDENT_AMBULATORY_CARE_PROVIDER_SITE_OTHER): Payer: BC Managed Care – PPO | Admitting: Family Medicine

## 2012-05-13 VITALS — BP 142/84 | HR 71 | Temp 98.2°F | Wt 229.0 lb

## 2012-05-13 DIAGNOSIS — L723 Sebaceous cyst: Secondary | ICD-10-CM

## 2012-05-13 NOTE — Progress Notes (Signed)
Lesion on back.  Present for 3 days, prev red and itchy, less so now. Not especially tender.    Another similar lesion on back will occ drain.  Has been present for much longer, ie years.    3 years with a knot on L side of neck.  Occ mild variation in size.  Doesn't get red or tender. Doesn't drain.  No local skin changes o/w.   Meds, vitals, and allergies reviewed.   ROS: See HPI.  Otherwise, noncontributory.  nad ncat Tm wnl Nasal and OP exam wnl Neck supple, no LA but ~1cm lesion on L side of neck, mobile in the skin. No erythema.  Feels rubbery.  Back with 2  Sebaceous cysts noted.  L upper back- longstanding per patient.  More recent lesion in mid back.  Not tender.  Very minimal erythema.

## 2012-05-13 NOTE — Patient Instructions (Signed)
No charge for visit. If the spots on your back get bigger or redder, then we can drain them.  Get a 30 min visit.   Call if you want to go to the surgery clinic for the spot on your neck.

## 2012-05-14 NOTE — Assessment & Plan Note (Signed)
1 was present for years.  The other isn't very tender and doesn't need draining now.  It may drain on its own.  Reassured, if worsening then fu for I&D.  He agrees  D/w pt.  Neck lesion cyst vs lipoma vs node.  Will defer to PCP and pt.  If he desires we can send to surgery for consideration of full excision.  No intervention. No charge.

## 2012-06-11 ENCOUNTER — Other Ambulatory Visit: Payer: Self-pay | Admitting: Family Medicine

## 2012-06-11 DIAGNOSIS — E663 Overweight: Secondary | ICD-10-CM

## 2012-06-11 DIAGNOSIS — Z125 Encounter for screening for malignant neoplasm of prostate: Secondary | ICD-10-CM

## 2012-06-12 ENCOUNTER — Other Ambulatory Visit (INDEPENDENT_AMBULATORY_CARE_PROVIDER_SITE_OTHER): Payer: BC Managed Care – PPO

## 2012-06-12 DIAGNOSIS — Z125 Encounter for screening for malignant neoplasm of prostate: Secondary | ICD-10-CM

## 2012-06-12 DIAGNOSIS — E663 Overweight: Secondary | ICD-10-CM

## 2012-06-12 LAB — COMPREHENSIVE METABOLIC PANEL
Albumin: 3.5 g/dL (ref 3.5–5.2)
Alkaline Phosphatase: 113 U/L (ref 39–117)
BUN: 24 mg/dL — ABNORMAL HIGH (ref 6–23)
CO2: 25 mEq/L (ref 19–32)
Calcium: 8.6 mg/dL (ref 8.4–10.5)
Chloride: 99 mEq/L (ref 96–112)
GFR: 59.29 mL/min — ABNORMAL LOW (ref >60.00)
Glucose, Bld: 103 mg/dL — ABNORMAL HIGH (ref 70–99)
Potassium: 4 mEq/L (ref 3.5–5.1)
Sodium: 135 mEq/L (ref 135–145)
Total Protein: 7.2 g/dL (ref 6.0–8.3)

## 2012-06-12 LAB — LIPID PANEL
Cholesterol: 147 mg/dL (ref 0–200)
VLDL: 25.4 mg/dL (ref 0.0–40.0)

## 2012-06-12 LAB — PSA: PSA: 0.12 ng/mL (ref 0.10–4.00)

## 2012-06-20 ENCOUNTER — Encounter: Payer: Self-pay | Admitting: Family Medicine

## 2012-06-20 ENCOUNTER — Ambulatory Visit (INDEPENDENT_AMBULATORY_CARE_PROVIDER_SITE_OTHER): Payer: BC Managed Care – PPO | Admitting: Family Medicine

## 2012-06-20 VITALS — BP 122/88 | HR 64 | Temp 98.0°F | Ht 73.0 in | Wt 221.0 lb

## 2012-06-20 DIAGNOSIS — K219 Gastro-esophageal reflux disease without esophagitis: Secondary | ICD-10-CM

## 2012-06-20 DIAGNOSIS — Z23 Encounter for immunization: Secondary | ICD-10-CM

## 2012-06-20 DIAGNOSIS — Z1211 Encounter for screening for malignant neoplasm of colon: Secondary | ICD-10-CM

## 2012-06-20 DIAGNOSIS — Z0000 Encounter for general adult medical examination without abnormal findings: Secondary | ICD-10-CM

## 2012-06-20 DIAGNOSIS — R194 Change in bowel habit: Secondary | ICD-10-CM

## 2012-06-20 DIAGNOSIS — R198 Other specified symptoms and signs involving the digestive system and abdomen: Secondary | ICD-10-CM

## 2012-06-20 MED ORDER — CULTURELLE PO CAPS: 1.0000 | ORAL_CAPSULE | Freq: Every day | ORAL | Status: DC

## 2012-06-20 NOTE — Addendum Note (Signed)
Addended by: Eliezer Bottom on: 06/20/2012 01:02 PM   Modules accepted: Orders

## 2012-06-20 NOTE — Assessment & Plan Note (Addendum)
Unsure etiology of recent BM change. Try probiotic. If not better, will refer to GI for further eval. Pt agrees with plan. Last colonoscopy was 2005, WNL, rec rpt 10 yrs.  Reviewed paper chart - verified normal colonoscopy.

## 2012-06-20 NOTE — Progress Notes (Signed)
Subjective:    Patient ID: Mark Martinez, male    DOB: Dec 21, 1950, 62 y.o.   MRN: [id number]  HPI CC: annual  GERD - stable on nexium.  For last 6 wks has not had solid stool.  No abd pain.  Increased gas.  No change in diet.  No blood in stool.  Some days very loose.  Increased frequency recently.  Not more stressed recently.  Last week had viral gastroenteritis with vomiting and diarrhea.  Stopped MVI to see if would help, didn't.  Drinking plenty of water.  Good fiber in diet, good fruits/vegetables.  Does not use yogurt.  Wt Readings from Last 3 Encounters:  06/20/12 221 lb (100.245 kg)  05/13/12 229 lb (103.874 kg)  03/07/12 221 lb 4 oz (100.358 kg)    Caffeine: rare Lives with wife, 1 dog. Grown children Occupation: Works in Aeronautical engineer. Activity: stays active at work Diet: daily fruits/vegetables, some water  Preventative:  Tetanus 2004.  Will receive Tdap today.  Flu shot today. Colonoscopy - 2005, rec rpt 10 years.  No records in chart.  Done in [place]. Prostate - would like to continue screening.  Sunscreen use discussed. Seatbelt use discussed.  Medications and allergies reviewed and updated in chart.  Past histories reviewed and updated if relevant as below. Patient Active Problem List  Diagnosis  . Overweight  . ERECTILE DYSFUNCTION  . GASTROESOPHAGEAL REFLUX DISEASE  . PROSTATITIS, HX OF  . Healthcare maintenance  . Hordeolum externum  . Sebaceous cyst   Past Medical History  Diagnosis Date  . GERD (gastroesophageal reflux disease)     with hiatal hernia  . History of prostatitis   . ED (erectile dysfunction)    Past Surgical History  Procedure Date  . Back surgery 1994    lumbar area  . Esophagogastroduodenoscopy 12-27-1996    hiatal hernia (Dr. Jarold Motto)  . Esophageal dilation      three last  . Colonoscopy 2005    WNL, rpt 10 yrs   History  Substance Use Topics  . Smoking status: Never Smoker   . Smokeless tobacco: Former Neurosurgeon      Quit date: 01/30/2009  . Alcohol Use: No   Family History  Problem Relation Age of Onset  . Heart disease Mother     faulty valve  . Ulcers Father   . CAD Father 43  . Heart disease Father 65    mult stents  . Cancer Neg Hx   . Diabetes Neg Hx   . Stroke Neg Hx    No Known Allergies Current Outpatient Prescriptions on File Prior to Visit  Medication Sig Dispense Refill  . esomeprazole (NEXIUM) 40 MG capsule Take 1 capsule (40 mg total) by mouth daily before breakfast.  30 capsule  11  . Multiple Vitamin (MULTIVITAMIN) tablet Take 1 tablet by mouth daily.        . tadalafil (CIALIS) 5 MG tablet Take 5 mg by mouth daily as needed.         Review of Systems  Constitutional: Negative for fever, chills, activity change, appetite change, fatigue and unexpected weight change.  HENT: Negative for hearing loss and neck pain.   Eyes: Negative for visual disturbance.  Respiratory: Negative for cough, chest tightness, shortness of breath and wheezing.   Cardiovascular: Negative for chest pain, palpitations and leg swelling.  Gastrointestinal: Positive for vomiting (stomach bug) and diarrhea. Negative for nausea, abdominal pain, constipation, blood in stool and abdominal distention.  Genitourinary: Negative for  hematuria and difficulty urinating.  Musculoskeletal: Negative for myalgias and arthralgias.  Skin: Negative for rash.  Neurological: Negative for dizziness, seizures, syncope and headaches.  Hematological: Does not bruise/bleed easily.  Psychiatric/Behavioral: Negative for dysphoric mood. The patient is not nervous/anxious.        Objective:   Physical Exam  Nursing note and vitals reviewed. Constitutional: He is oriented to person, place, and time. He appears well-developed and well-nourished. No distress.  HENT:  Head: Normocephalic and atraumatic.  Right Ear: Hearing, tympanic membrane, external ear and ear canal normal.  Left Ear: Hearing, tympanic membrane, external  ear and ear canal normal.  Nose: Nose normal.  Mouth/Throat: Oropharynx is clear and moist. No oropharyngeal exudate.  Eyes: Conjunctivae normal and EOM are normal. Pupils are equal, round, and reactive to light. No scleral icterus.  Neck: Normal range of motion. Neck supple.  Cardiovascular: Normal rate, regular rhythm, normal heart sounds and intact distal pulses.   No murmur heard. Pulses:      Radial pulses are 2+ on the right side, and 2+ on the left side.  Pulmonary/Chest: Effort normal and breath sounds normal. No respiratory distress. He has no wheezes. He has no rales.  Abdominal: Soft. Bowel sounds are normal. He exhibits no distension and no mass. There is no tenderness. There is no rebound and no guarding.  Genitourinary: Rectum normal and prostate normal. Rectal exam shows no external hemorrhoid, no internal hemorrhoid, no fissure, no mass, no tenderness and anal tone normal. Guaiac negative stool. Prostate is not enlarged and not tender.  Musculoskeletal: Normal range of motion. He exhibits no edema.  Lymphadenopathy:    He has no cervical adenopathy.  Neurological: He is alert and oriented to person, place, and time.       CN grossly intact, station and gait intact  Skin: Skin is warm and dry. No rash noted.  Psychiatric: He has a normal mood and affect. His behavior is normal. Judgment and thought content normal.       Assessment & Plan:

## 2012-06-20 NOTE — Assessment & Plan Note (Signed)
Preventative protocols reviewed and updated unless pt declined. Discussed healthy diet and lifestyle. Tdap and flu today.

## 2012-06-20 NOTE — Patient Instructions (Addendum)
Tetanus (Tdap) and flu shot today  Pick up stool kit on your way out. Return as needed or in 1 year for next physical. Try probiotic daily - culturelle - for next 1-2 weeks to see if improvement in bowel changes.  Sent into pharmacy but should be over the counter.  If not better, call me for referral to stomach doctor.

## 2012-06-20 NOTE — Assessment & Plan Note (Signed)
Stable on chronic PPI. Continue nexium.

## 2012-07-16 ENCOUNTER — Telehealth: Payer: Self-pay | Admitting: *Deleted

## 2012-07-16 NOTE — Telephone Encounter (Signed)
PA form for Nexium in your IN box for completion.  

## 2012-07-17 NOTE — Telephone Encounter (Signed)
PA form faxed.  

## 2012-07-17 NOTE — Telephone Encounter (Signed)
Filled and placed in Kim's box. 

## 2012-07-21 ENCOUNTER — Other Ambulatory Visit: Payer: BC Managed Care – PPO

## 2012-07-21 DIAGNOSIS — Z1211 Encounter for screening for malignant neoplasm of colon: Secondary | ICD-10-CM

## 2012-07-21 LAB — FECAL OCCULT BLOOD, IMMUNOCHEMICAL: Fecal Occult Bld: NEGATIVE

## 2012-07-21 NOTE — Telephone Encounter (Signed)
[employer] has sent form for additional info; form in Dr Timoteo Expose box.

## 2012-07-21 NOTE — Telephone Encounter (Signed)
Form faxed

## 2012-07-21 NOTE — Telephone Encounter (Signed)
Filled and placed in kim's box. 

## 2012-07-22 ENCOUNTER — Telehealth: Payer: Self-pay | Admitting: *Deleted

## 2012-07-22 NOTE — Telephone Encounter (Signed)
Nexium approved 07/21/12-07/21/14. Pharmacy notified.

## 2012-07-23 ENCOUNTER — Telehealth: Payer: Self-pay | Admitting: *Deleted

## 2012-07-23 NOTE — Telephone Encounter (Signed)
Spoke with patient and he advised BM's changes had stabilized and were back to normal now. He was notified of stool study results.

## 2012-08-20 ENCOUNTER — Other Ambulatory Visit: Payer: Self-pay | Admitting: Family Medicine

## 2012-10-16 ENCOUNTER — Encounter: Payer: Self-pay | Admitting: Family Medicine

## 2012-10-16 ENCOUNTER — Telehealth: Payer: Self-pay | Admitting: Family Medicine

## 2012-10-16 ENCOUNTER — Ambulatory Visit (INDEPENDENT_AMBULATORY_CARE_PROVIDER_SITE_OTHER): Payer: BC Managed Care – PPO | Admitting: Family Medicine

## 2012-10-16 VITALS — BP 130/88 | HR 72 | Temp 98.1°F | Wt 224.0 lb

## 2012-10-16 DIAGNOSIS — J069 Acute upper respiratory infection, unspecified: Secondary | ICD-10-CM

## 2012-10-16 MED ORDER — FLUTICASONE PROPIONATE 50 MCG/ACT NA SUSP: 2.0000 | Freq: Every day | NASAL | Status: DC

## 2012-10-16 MED ORDER — AMOXICILLIN 875 MG PO TABS: 875.0000 mg | ORAL_TABLET | Freq: Two times a day (BID) | ORAL | Status: DC

## 2012-10-16 NOTE — Telephone Encounter (Signed)
[facility] Triage Call Report Triage Record Num: [id number] Operator: Geanie Berlin Patient Name: [facility] Call Date & Time: 10/15/2012 5:13:47PM Patient Phone: [phone] PCP: Eustaquio Boyden Patient Gender: Male PCP Fax : [phone] Patient DOB: [birth year] Practice Name: [facility] Reason for Call: Caller: Timarion/Patient; PCP: Eustaquio Boyden [facility]); CB#: [phone]; Call regarding Cough/Congestion; Requesting antibiotic be called. Onset 10/12/12. Temp/tactile but not tested. Reports nasal congestion and productive cough with green color. Hydrate and humidify. Advised to see MD within 24 hours for sinus pressure and yellow-green drainage from nose or down back of throat per Cough guideline. Scheduled appointment for 10/16/12 at 0800 with Dr. Dayton Martes. Protocol(s) Used: Cough - Adult Protocol(s) Used: Upper Respiratory Infection (URI) Recommended Outcome per Protocol: See Provider within 24 hours Reason for Outcome: Cough is the symptom causing most concern Pressure/pain above or below eyes, near ears over sinuses (may also be described as fullness, worsens when bending forward, teeth or eye pain) AND yellow-green drainage from nose or down back of throat. Care Advice: ~ Use a cool mist humidifier to moisten air. Be sure to clean according to manufacturer's instructions. ~ Consider use of a saline nasal spray per package directions to help relieve nasal congestion. ~ SYMPTOM / CONDITION MANAGEMENT A warm, moist compress placed on face, over eyes for 15 to 20 minutes, 5 to 6 times a day, may help relieve the congestion. ~ Analgesic/Antipyretic Advice - Acetaminophen: Consider acetaminophen as directed on label or by pharmacist/provider for pain or fever PRECAUTIONS: - Use if there is no history of liver disease, alcoholism, or intake of three or more alcohol drinks per day - Only if approved by provider during pregnancy or when breastfeeding -  During pregnancy, acetaminophen should not be taken more than 3 consecutive days without telling provider - Do not exceed recommended dose or frequency ~ Analgesic/Antipyretic Advice - NSAIDs: Consider aspirin, ibuprofen, naproxen or ketoprofen for pain or fever as directed on label or by pharmacist/provider. PRECAUTIONS: - If over 64 years of age, should not take longer than 1 week without consulting provider. EXCEPTIONS: - Should not be used if taking blood thinners or have bleeding problems. - Do not use if have history of sensitivity/allergy to any of these medications; or history of cardiovascular, ulcer, kidney, liver disease or diabetes unless approved by provider. - Do not exceed recommended dose or frequency. ~ 10/15/2012 5:26:36PM Page 1 of 1 [id number]

## 2012-10-16 NOTE — Progress Notes (Signed)
SUBJECTIVE:  Mark Martinez is a 62 y.o. male pt of Dr. Reece Agar who complains of coryza, congestion, sneezing, sore throat, productive cough and bilateral sinus pain for 6 days. He denies a history of anorexia and chest pain and denies a history of asthma. Patient denies smoke cigarettes.   Patient Active Problem List   Diagnosis Date Noted  . Bowel habit changes 06/20/2012  . Sebaceous cyst 05/14/2012  . Hordeolum externum 03/07/2012  . Healthcare maintenance 01/31/2011  . Overweight 01/26/2010  . GASTROESOPHAGEAL REFLUX DISEASE 11/20/2007  . ERECTILE DYSFUNCTION 11/07/2007  . PROSTATITIS, HX OF 11/07/2007   Past Medical History  Diagnosis Date  . GERD (gastroesophageal reflux disease)     with hiatal hernia  . History of prostatitis   . ED (erectile dysfunction)    Past Surgical History  Procedure Laterality Date  . Back surgery  1994    lumbar area  . Esophagogastroduodenoscopy  12-27-1996    hiatal hernia (Dr. Jarold Motto)  . Esophageal dilation       three last  . Colonoscopy  2005    WNL, rpt 10 yrs   History  Substance Use Topics  . Smoking status: Never Smoker   . Smokeless tobacco: Former Neurosurgeon    Quit date: 01/30/2009  . Alcohol Use: No   Family History  Problem Relation Age of Onset  . Heart disease Mother     faulty valve  . Ulcers Father   . CAD Father 85  . Heart disease Father 65    mult stents  . Cancer Neg Hx   . Diabetes Neg Hx   . Stroke Neg Hx    No Known Allergies Current Outpatient Prescriptions on File Prior to Visit  Medication Sig Dispense Refill  . Lactobacillus Rhamnosus, GG, (CULTURELLE) CAPS Take 1 capsule by mouth daily.  30 capsule  1  . Multiple Vitamin (MULTIVITAMIN) tablet Take 1 tablet by mouth daily.        Marland Kitchen NEXIUM 40 MG capsule TAKE 1 CAPSULE (40 MG TOTAL) BY MOUTH DAILY BEFORE BREAKFAST.  30 capsule  11  . tadalafil (CIALIS) 5 MG tablet Take 5 mg by mouth daily as needed.       No current facility-administered medications on  file prior to visit.   The PMH, PSH, Social History, Family History, Medications, and allergies have been reviewed in [facility], and have been updated if relevant.  OBJECTIVE: BP 130/88  Pulse 72  Temp(Src) 98.1 F (36.7 C)  Wt 224 lb (101.606 kg)  BMI 29.56 kg/m2  He appears well, vital signs are as noted. Ears normal.  Throat and pharynx normal.  Neck supple. No adenopathy in the neck. Nose is congested. Sinuses  tender. The chest is clear, without wheezes or rales, + wet cough.  ASSESSMENT:  sinusitis  PLAN: Given duration and progression of symptoms, will treat for bacterial sinusitis with Augmentin, flonase. Symptomatic therapy suggested: push fluids, rest and return office visit prn if symptoms persist or worsen.Call or return to clinic prn if these symptoms worsen or fail to improve as anticipated.

## 2012-10-16 NOTE — Patient Instructions (Addendum)
Take Augmentin as directed- 1 tablet twice daily x 10 days.  Drink lots of fluids.   Treat sympotmatically with Mucinex, nasal saline irrigation, and Tylenol/Ibuprofen.  Flonase as directed.  You can use warm compresses.     Call if not improving as expected in 5-7 days.

## 2013-07-22 ENCOUNTER — Telehealth: Payer: Self-pay | Admitting: *Deleted

## 2013-07-22 NOTE — Telephone Encounter (Signed)
Form for Nexium use in your IN box for completion.

## 2013-07-24 NOTE — Telephone Encounter (Signed)
Noted.  Prolonged treatment.  Will continue.

## 2013-08-07 ENCOUNTER — Encounter: Payer: Self-pay | Admitting: Family Medicine

## 2013-08-07 ENCOUNTER — Ambulatory Visit (INDEPENDENT_AMBULATORY_CARE_PROVIDER_SITE_OTHER): Payer: BC Managed Care – PPO | Admitting: Family Medicine

## 2013-08-07 VITALS — BP 118/90 | HR 80 | Temp 98.2°F | Ht 73.0 in | Wt 225.2 lb

## 2013-08-07 DIAGNOSIS — R0600 Dyspnea, unspecified: Secondary | ICD-10-CM | POA: Insufficient documentation

## 2013-08-07 DIAGNOSIS — J019 Acute sinusitis, unspecified: Secondary | ICD-10-CM | POA: Insufficient documentation

## 2013-08-07 DIAGNOSIS — Z1211 Encounter for screening for malignant neoplasm of colon: Secondary | ICD-10-CM

## 2013-08-07 DIAGNOSIS — Z0000 Encounter for general adult medical examination without abnormal findings: Secondary | ICD-10-CM

## 2013-08-07 DIAGNOSIS — R229 Localized swelling, mass and lump, unspecified: Secondary | ICD-10-CM

## 2013-08-07 DIAGNOSIS — K219 Gastro-esophageal reflux disease without esophagitis: Secondary | ICD-10-CM

## 2013-08-07 DIAGNOSIS — R0609 Other forms of dyspnea: Secondary | ICD-10-CM

## 2013-08-07 DIAGNOSIS — R0989 Other specified symptoms and signs involving the circulatory and respiratory systems: Secondary | ICD-10-CM

## 2013-08-07 DIAGNOSIS — Z125 Encounter for screening for malignant neoplasm of prostate: Secondary | ICD-10-CM

## 2013-08-07 LAB — LIPID PANEL
Cholesterol: 221 mg/dL — ABNORMAL HIGH (ref 0–200)
HDL: 46.7 mg/dL (ref >39.00)
Total CHOL/HDL Ratio: 5
Triglycerides: 227 mg/dL — ABNORMAL HIGH (ref 0.0–149.0)
VLDL: 45.4 mg/dL — ABNORMAL HIGH (ref 0.0–40.0)

## 2013-08-07 LAB — COMPREHENSIVE METABOLIC PANEL
ALBUMIN: 3.7 g/dL (ref 3.5–5.2)
ALT: 32 U/L (ref 0–53)
AST: 32 U/L (ref 0–37)
Alkaline Phosphatase: 115 U/L (ref 39–117)
BUN: 17 mg/dL (ref 6–23)
CALCIUM: 9.2 mg/dL (ref 8.4–10.5)
CHLORIDE: 103 meq/L (ref 96–112)
CO2: 25 meq/L (ref 19–32)
Creatinine, Ser: 1 mg/dL (ref 0.4–1.5)
GFR: 84.55 mL/min (ref >60.00)
GLUCOSE: 99 mg/dL (ref 70–99)
POTASSIUM: 4.4 meq/L (ref 3.5–5.1)
Sodium: 137 mEq/L (ref 135–145)
Total Bilirubin: 0.6 mg/dL (ref 0.3–1.2)
Total Protein: 7.5 g/dL (ref 6.0–8.3)

## 2013-08-07 LAB — PSA: PSA: 0.14 ng/mL (ref 0.10–4.00)

## 2013-08-07 MED ORDER — AZITHROMYCIN 250 MG PO TABS
ORAL_TABLET | ORAL | Status: DC
Start: 2013-08-07 — End: 2013-08-12

## 2013-08-07 MED ORDER — TADALAFIL 5 MG PO TABS: 5.0000 mg | ORAL_TABLET | Freq: Every day | ORAL | Status: DC | PRN

## 2013-08-07 NOTE — Patient Instructions (Signed)
Stool kit today Pass by lab for blood work. We will call you to set up referral for heart doctor. I will refer you to skin doctor for evaluation of neck cyst.

## 2013-08-07 NOTE — Progress Notes (Signed)
Pre visit review using our clinic review tool, if applicable. No additional management support is needed unless otherwise documented below in the visit note. 

## 2013-08-07 NOTE — Assessment & Plan Note (Signed)
Stable on nexium

## 2013-08-07 NOTE — Assessment & Plan Note (Signed)
Given duration of sxs, anticipate has developed sinusitis after initial viral URI. Will cover with azithromycin. Continue mucinex.

## 2013-08-07 NOTE — Assessment & Plan Note (Signed)
CV risk factors include age, sex and fmhx.  Negative risk factors are normal blood pressure, cholesterol levels and nonsmoker status. Will refer to cards for discussion on further eval with stress test.

## 2013-08-07 NOTE — Progress Notes (Addendum)
BP 118/90  Pulse 80  Temp(Src) 98.2 F (36.8 C) (Oral)  Ht _0  (1.854 m)  Wt 225 lb 4 oz (102.173 kg)  BMI 29.72 kg/m2   CC: CPE  Subjective:    Patient ID: Mark Martinez, male    DOB: 1950-12-17, 63 y.o.   MRN: [id number]  HPI: Mark Martinez is a 63 y.o. male presenting on 08/07/2013 with Annual Exam   Getting over a cold - present for last month.  recenlty hacking dry cough, now starting to cough up green mucous and head/sinus congestion, headache, hoarse voice worse this week.  Head > chest congestion.  No ST, fevers, abd pain, tooth pain.  Taking mucinex and cough syrup.  Wife sick recently as well. No smokers at home.  No h/o asthma.  Noticing increasing dyspnea over the last several months.  Landscapes, finds trouble catching breath esp walking up hill.  Progressive onset of sxs.  No chest pain with this.  Finds trouble with deep breath when bending over to tie shoes.  Does have fmhx CAD but not premature (father and paternal uncles x3 with CAD/MI/stent).  Would like to have heart evaluated prior to starting exercise regimen (has cardio machines at home).  Would like cyst on skin removed.  Present for years, not enlarging but cuts it when shaving.  Caffeine: rare  Lives with wife, 1 dog.  Grown children  Occupation: Works in Biomedical scientist.  Activity: stays active at work  Diet: daily fruits/vegetables, some water   Preventative:  Tdap 2014.  Flu shot done Colonoscopy - 2005, rec rpt 10 years. No records in chart. Done in [place]. Stool kit today. Prostate - would like to continue screening.   Sunscreen use discussed.  Seatbelt use discussed.   Relevant past medical, surgical, family and social history reviewed and updated. Allergies and medications reviewed and updated. Current Outpatient Prescriptions on File Prior to Visit  Medication Sig  . NEXIUM 40 MG capsule TAKE 1 CAPSULE (40 MG TOTAL) BY MOUTH DAILY BEFORE BREAKFAST.  . tadalafil (CIALIS) 5 MG tablet  Take 5 mg by mouth daily as needed.   No current facility-administered medications on file prior to visit.    Review of Systems  Constitutional: Negative for fever, chills, activity change, appetite change, fatigue and unexpected weight change.  HENT: Negative for hearing loss.   Eyes: Negative for visual disturbance.  Respiratory: Positive for cough and shortness of breath (see HPI). Negative for chest tightness and wheezing.   Cardiovascular: Negative for chest pain, palpitations and leg swelling.  Gastrointestinal: Negative for nausea, vomiting, abdominal pain, diarrhea, constipation, blood in stool and abdominal distention.  Genitourinary: Negative for hematuria and difficulty urinating.  Musculoskeletal: Negative for arthralgias, myalgias and neck pain.  Skin: Negative for rash.  Neurological: Negative for dizziness, seizures, syncope and headaches.  Hematological: Negative for adenopathy. Does not bruise/bleed easily.  Psychiatric/Behavioral: Negative for dysphoric mood. The patient is not nervous/anxious.    Per HPI unless specifically indicated above    Objective:    BP 118/90  Pulse 80  Temp(Src) 98.2 F (36.8 C) (Oral)  Ht _1  (1.854 m)  Wt 225 lb 4 oz (102.173 kg)  BMI 29.72 kg/m2  Physical Exam  Nursing note and vitals reviewed. Constitutional: He is oriented to person, place, and time. He appears well-developed and well-nourished. No distress.  HENT:  Head: Normocephalic and atraumatic.  Right Ear: Hearing, tympanic membrane, external ear and ear canal normal.  Left Ear: Hearing, tympanic  membrane, external ear and ear canal normal.  Nose: Nose normal. No mucosal edema or rhinorrhea. Right sinus exhibits no maxillary sinus tenderness and no frontal sinus tenderness. Left sinus exhibits no maxillary sinus tenderness and no frontal sinus tenderness.  Mouth/Throat: Uvula is midline and mucous membranes are normal. Posterior oropharyngeal erythema present. No  oropharyngeal exudate, posterior oropharyngeal edema or tonsillar abscesses.  Eyes: Conjunctivae and EOM are normal. Pupils are equal, round, and reactive to light. No scleral icterus.  Neck: Normal range of motion. Neck supple. Carotid bruit is not present.  Cardiovascular: Normal rate, regular rhythm, normal heart sounds and intact distal pulses.   No murmur heard. Pulses:      Radial pulses are 2+ on the right side, and 2+ on the left side.  Pulmonary/Chest: Effort normal and breath sounds normal. No respiratory distress. He has no wheezes. He has no rales.  Abdominal: Soft. Bowel sounds are normal. He exhibits no distension and no mass. There is no tenderness. There is no rebound and no guarding.  Genitourinary: Rectum normal and prostate normal. Rectal exam shows no external hemorrhoid, no internal hemorrhoid, no fissure, no mass, no tenderness and anal tone normal. Prostate is not enlarged (10-15gm) and not tender.  Musculoskeletal: Normal range of motion. He exhibits no edema.  Lymphadenopathy:    He has no cervical adenopathy.  Neurological: He is alert and oriented to person, place, and time.  CN grossly intact, station and gait intact  Skin: Skin is warm and dry. No rash noted.  Psychiatric: He has a normal mood and affect. His behavior is normal. Judgment and thought content normal.       Assessment & Plan:   Problem List Items Addressed This Visit   Dyspnea     CV risk factors include age, sex and fmhx.  Negative risk factors are normal blood pressure, cholesterol levels and nonsmoker status. Will refer to cards for discussion on further eval with stress test.    Relevant Orders      Ambulatory referral to Cardiology   GASTROESOPHAGEAL REFLUX DISEASE     Stable on nexium.    Healthcare maintenance - Primary     Preventative protocols reviewed and updated unless pt declined. Discussed healthy diet and lifestyle.  iFOB today DRE reassuring today.  check PSA today. Check  blood work today.    Relevant Orders      Lipid panel      Comprehensive metabolic panel   Lump of skin     Cyst vs small lipoma - pt nicks when he shaves. Will refer to surgery for discussion on excision. Pt agrees as he desires removal.    Relevant Orders      Ambulatory referral to General Surgery   Sinusitis, acute     Given duration of sxs, anticipate has developed sinusitis after initial viral URI. Will cover with azithromycin. Continue mucinex.    Relevant Medications      azithromycin (ZITHROMAX) tablet    Other Visit Diagnoses   Special screening for malignant neoplasms, colon        Relevant Orders       Fecal occult blood, imunochemical    Special screening for malignant neoplasm of prostate        Relevant Orders       PSA        Follow up plan: Return in about 1 year (around 08/07/2014), or as needed, for annual exam, prior fasting for blood work.

## 2013-08-07 NOTE — Assessment & Plan Note (Signed)
Cyst vs small lipoma - pt nicks when he shaves. Will refer to surgery for discussion on excision. Pt agrees as he desires removal.

## 2013-08-07 NOTE — Addendum Note (Signed)
Addended by: Ria Bush on: 08/07/2013 09:16 AM   Modules accepted: Orders

## 2013-08-07 NOTE — Assessment & Plan Note (Addendum)
Preventative protocols reviewed and updated unless pt declined. Discussed healthy diet and lifestyle.  iFOB today DRE reassuring today.  check PSA today. Check blood work today.

## 2013-08-10 LAB — LDL CHOLESTEROL, DIRECT: LDL DIRECT: 142.5 mg/dL

## 2013-08-11 ENCOUNTER — Encounter: Payer: Self-pay | Admitting: Family Medicine

## 2013-08-11 ENCOUNTER — Encounter: Payer: Self-pay | Admitting: *Deleted

## 2013-08-11 DIAGNOSIS — E785 Hyperlipidemia, unspecified: Secondary | ICD-10-CM | POA: Insufficient documentation

## 2013-08-12 ENCOUNTER — Encounter (INDEPENDENT_AMBULATORY_CARE_PROVIDER_SITE_OTHER): Payer: Self-pay | Admitting: General Surgery

## 2013-08-12 ENCOUNTER — Ambulatory Visit (INDEPENDENT_AMBULATORY_CARE_PROVIDER_SITE_OTHER): Payer: BC Managed Care – PPO | Admitting: General Surgery

## 2013-08-12 VITALS — BP 138/90 | HR 80 | Temp 98.0°F | Resp 14 | Ht 75.0 in | Wt 229.6 lb

## 2013-08-12 DIAGNOSIS — L723 Sebaceous cyst: Secondary | ICD-10-CM

## 2013-08-12 DIAGNOSIS — L720 Epidermal cyst: Secondary | ICD-10-CM | POA: Insufficient documentation

## 2013-08-12 NOTE — Progress Notes (Signed)
Patient ID: Mark Martinez, male   DOB: [birth year], 63 y.o.   MRN: [id number]  Chief Complaint  Patient presents with  . New Evaluation    eval lipoma on left side of neck    HPI Mark Martinez is a 63 y.o. male.  Chief complaint: Mass left neck HPI Patient initially noticed a small pimple area on his left neck 2 years ago. It resolved but the area has gradually gotten larger over the past 2 years. It has not drained any fluid since then. No significant redness or pain. It is a little bit larger. I was asked to see him in consultation by Dr. Danise Mina. Past Medical History  Diagnosis Date  . GERD (gastroesophageal reflux disease)     with hiatal hernia  . History of prostatitis   . ED (erectile dysfunction)   . HLD (hyperlipidemia)     Past Surgical History  Procedure Laterality Date  . Back surgery  1994    lumbar area  . Esophagogastroduodenoscopy  12-27-1996    hiatal hernia (Dr. Sharlett Iles)  . Esophageal dilation      three last  . Colonoscopy  2005    WNL, rpt 10 yrs    Family History  Problem Relation Age of Onset  . Valvular heart disease Mother   . Ulcers Father   . CAD Father 90    multiple stents  . Cancer Neg Hx   . Diabetes Neg Hx   . Stroke Neg Hx   . CAD Paternal Uncle     Social History History  Substance Use Topics  . Smoking status: Never Smoker   . Smokeless tobacco: Former Systems developer    Quit date: 01/30/2009  . Alcohol Use: No    No Known Allergies  Current Outpatient Prescriptions  Medication Sig Dispense Refill  . NEXIUM 40 MG capsule TAKE 1 CAPSULE (40 MG TOTAL) BY MOUTH DAILY BEFORE BREAKFAST.  30 capsule  11  . tadalafil (CIALIS) 5 MG tablet Take 1 tablet (5 mg total) by mouth daily as needed.  30 tablet  6   No current facility-administered medications for this visit.    Review of Systems Review of Systems  Constitutional: Negative for fever, chills and unexpected weight change.  HENT: Negative for congestion, hearing loss, sore  throat, trouble swallowing and voice change.   Eyes: Negative for visual disturbance.  Respiratory: Negative for cough and wheezing.   Cardiovascular: Negative for chest pain, palpitations and leg swelling.  Gastrointestinal: Negative for nausea, vomiting, abdominal pain, diarrhea, constipation, blood in stool, abdominal distention, anal bleeding and rectal pain.  Genitourinary: Negative for hematuria and difficulty urinating.  Musculoskeletal: Negative for arthralgias.  Skin: Negative for rash and wound.       See history of present illness  Neurological: Negative for seizures, syncope, weakness and headaches.  Hematological: Negative for adenopathy. Does not bruise/bleed easily.  Psychiatric/Behavioral: Negative for confusion.    Blood pressure 138/90, pulse 80, temperature 98 F (36.7 C), temperature source Oral, resp. rate 14, height 6\' 3"  (1.905 m), weight 229 lb 9.6 oz (104.146 kg).  Physical Exam Physical Exam  Constitutional: He is oriented to person, place, and time. He appears well-developed and well-nourished.  HENT:  Head: Normocephalic and atraumatic.  Eyes: Conjunctivae and EOM are normal. Pupils are equal, round, and reactive to light. No scleral icterus.  Neck: Normal range of motion. No tracheal deviation present.    1.5 cm epidermal inclusion cyst with no drainage or cellulitis left neck  Cardiovascular: Normal rate, normal heart sounds and intact distal pulses.   Pulmonary/Chest: Effort normal and breath sounds normal. No stridor. No respiratory distress. He has no wheezes. He has no rales.  Abdominal: Soft. Bowel sounds are normal. He exhibits no distension. There is no tenderness. There is no rebound and no guarding.  Musculoskeletal: Normal range of motion. He exhibits no edema and no tenderness.  Neurological: He is alert and oriented to person, place, and time. He exhibits normal muscle tone. Coordination normal.  Skin: Skin is warm and dry.  See above    Psychiatric: He has a normal mood and affect.    Data Reviewed Referral  Assessment    Epidermal inclusion cyst left neck    Plan    I have offered excision in the operating room as an outpatient procedure. Procedure, risks, benefits were discussed in detail with the patient. 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 We discussed the expected postoperative course.       Mark Martinez E 08/12/2013, 10:00 AM

## 2013-08-13 ENCOUNTER — Other Ambulatory Visit: Payer: Self-pay | Admitting: Family Medicine

## 2013-08-17 ENCOUNTER — Ambulatory Visit (INDEPENDENT_AMBULATORY_CARE_PROVIDER_SITE_OTHER): Payer: BC Managed Care – PPO | Admitting: Cardiovascular Disease

## 2013-08-17 ENCOUNTER — Encounter: Payer: Self-pay | Admitting: Cardiovascular Disease

## 2013-08-17 VITALS — BP 120/80 | HR 70 | Ht 75.0 in | Wt 225.5 lb

## 2013-08-17 DIAGNOSIS — R0609 Other forms of dyspnea: Secondary | ICD-10-CM

## 2013-08-17 DIAGNOSIS — R0600 Dyspnea, unspecified: Secondary | ICD-10-CM

## 2013-08-17 DIAGNOSIS — R5381 Other malaise: Secondary | ICD-10-CM

## 2013-08-17 DIAGNOSIS — R0989 Other specified symptoms and signs involving the circulatory and respiratory systems: Secondary | ICD-10-CM

## 2013-08-17 DIAGNOSIS — R9431 Abnormal electrocardiogram [ECG] [EKG]: Secondary | ICD-10-CM

## 2013-08-17 DIAGNOSIS — R5383 Other fatigue: Secondary | ICD-10-CM

## 2013-08-17 DIAGNOSIS — E785 Hyperlipidemia, unspecified: Secondary | ICD-10-CM

## 2013-08-17 NOTE — Patient Instructions (Signed)
You are doing well. No medication changes were made.  We will schedule you for a stress echocardiogram for shortness of breath We will call you with the results  Please call us if you have new issues that need to be addressed before your next appt.

## 2013-08-17 NOTE — Assessment & Plan Note (Signed)
We've gone to the cholesterol numbers with him. Recommended he cut back on his ice cream which she attributes to the recent spike in his cholesterol.

## 2013-08-17 NOTE — Assessment & Plan Note (Signed)
Etiology is not clear. Given his strong family history of CAD, we'll schedule a echo stress test. We did discuss various stress testing options and he prefers the echo stress. This will be done  at his convenience, likely next week. If this is normal with no wall motion abnormalities/no ischemia, do not think that further testing was needed. Otherwise clinical exam essentially benign . At that time would recommend he start exercise program

## 2013-08-17 NOTE — Progress Notes (Signed)
   Patient ID: Mark Martinez, male    DOB: Jan 05, 1951, 63 y.o.   MRN: [id number]  HPI Comments: Mark Martinez is a 63 year old gentleman, patient of Dr. Danise Mina, who presents by referral for symptoms of shortness of breath.  He reports that over the past several months, probably since last summer and 2014 he has been having more shortness of breath. He has shortness breath with walking, climbing hills, also with bending over. Typically he goes hunting in the winter and reports now he has to stop when he walks secondary to shortness of breath. He denies any dramatic weight gain but records show weight gain of 6 pounds of the past year. He is typically very active, does landscaping. He has been lifting wood to heat his house and notices some shortness of breath when moving the wood. He denies any significant chest pain. Ideally he would like to start an exercise regimen  He is concerned as his father had bypass surgery. Father was a former smoker Patient does not smoke, no diabetes history  EKG shows normal sinus rhythm with rate 70 beats a minute, no significant ST changes, nonspecific T wave abnormality in lead 3 and aVF     Outpatient Encounter Prescriptions as of 08/17/2013  Medication Sig  . acetaminophen (TYLENOL) 325 MG tablet Take 650 mg by mouth every 6 (six) hours as needed.  Marland Kitchen NEXIUM 40 MG capsule TAKE ONE CAPSULE BY MOUTH EVERY DAY  . tadalafil (CIALIS) 5 MG tablet Take 1 tablet (5 mg total) by mouth daily as needed.    Review of Systems  Constitutional: Negative.   HENT: Negative.   Eyes: Negative.   Respiratory: Negative.   Cardiovascular: Negative.   Gastrointestinal: Negative.   Endocrine: Negative.   Musculoskeletal: Negative.   Skin: Negative.   Allergic/Immunologic: Negative.   Neurological: Negative.   Hematological: Negative.   Psychiatric/Behavioral: Negative.   All other systems reviewed and are negative.   BP 120/80  Pulse 70  Ht 6\' 3"  (1.905 m)  Wt 225 lb  8 oz (102.286 kg)  BMI 28.19 kg/m2  Physical Exam  Nursing note and vitals reviewed. Constitutional: He is oriented to person, place, and time. He appears well-developed and well-nourished.  HENT:  Head: Normocephalic.  Nose: Nose normal.  Mouth/Throat: Oropharynx is clear and moist.  Eyes: Conjunctivae are normal. Pupils are equal, round, and reactive to light.  Neck: Normal range of motion. Neck supple. No JVD present.  Cardiovascular: Normal rate, regular rhythm, S1 normal, S2 normal, normal heart sounds and intact distal pulses.  Exam reveals no gallop and no friction rub.   No murmur heard. Pulmonary/Chest: Effort normal and breath sounds normal. No respiratory distress. He has no wheezes. He has no rales. He exhibits no tenderness.  Abdominal: Soft. Bowel sounds are normal. He exhibits no distension. There is no tenderness.  Musculoskeletal: Normal range of motion. He exhibits no edema and no tenderness.  Lymphadenopathy:    He has no cervical adenopathy.  Neurological: He is alert and oriented to person, place, and time. Coordination normal.  Skin: Skin is warm and dry. No rash noted. No erythema.  Psychiatric: He has a normal mood and affect. His behavior is normal. Judgment and thought content normal.      Assessment and Plan

## 2013-08-19 ENCOUNTER — Encounter (HOSPITAL_COMMUNITY): Payer: Self-pay | Admitting: Pharmacy Technician

## 2013-08-25 NOTE — Pre-Procedure Instructions (Signed)
Mark Martinez  08/25/2013   Your procedure is scheduled on:  Monday, March 16.  Report to [facility], Main Entrance [name]" at 9:30 AM.  Call this number if you have problems the morning of surgery: [phone]   Remember:   Do not eat food or drink liquids after midnight Sunday.   Take these medicines the morning of surgery with A SIP OF WATER: esomeprazole ([place]).              Take :acetaminophen (TYLENOL) if needed.   Do not wear jewelry, make-up or nail polish.  Do not wear lotions, powders, or perfumes.    Men may shave face and neck.  Do not bring valuables to the hospital.  [facility] is not responsible  for any belongings or valuables.               Contacts, dentures or bridgework may not be worn into surgery.  Leave suitcase in the car. After surgery it may be brought to your room.  For patients admitted to the hospital, discharge time is determined by your treatment team.               Patients discharged the day of surgery will not be allowed to drive home.  Name and phone number of your driver: -   Special Instructions: Review  [facility] - Preparing For Surgery.   Please read over the following fact sheets that you were given: Pain Booklet, Coughing and Deep Breathing and Surgical Site Infection Prevention

## 2013-08-26 ENCOUNTER — Ambulatory Visit (HOSPITAL_COMMUNITY)
Admission: RE | Admit: 2013-08-26 | Discharge: 2013-08-26 | Disposition: A | Discharge status: Home / Self Care | Payer: BC Managed Care – PPO | Source: Ambulatory Visit | Attending: Anesthesiology | Admitting: Anesthesiology

## 2013-08-26 ENCOUNTER — Encounter (HOSPITAL_COMMUNITY)
Admission: RE | Admit: 2013-08-26 | Discharge: 2013-08-26 | Disposition: A | Discharge status: Home / Self Care | Payer: BC Managed Care – PPO | Source: Ambulatory Visit | Attending: General Surgery | Admitting: General Surgery

## 2013-08-26 ENCOUNTER — Encounter (HOSPITAL_COMMUNITY): Payer: Self-pay

## 2013-08-26 DIAGNOSIS — Z01818 Encounter for other preprocedural examination: Secondary | ICD-10-CM | POA: Insufficient documentation

## 2013-08-26 DIAGNOSIS — Z01812 Encounter for preprocedural laboratory examination: Secondary | ICD-10-CM | POA: Insufficient documentation

## 2013-08-26 HISTORY — DX: Shortness of breath: R06.02

## 2013-08-26 LAB — CBC
HEMATOCRIT: 41.6 % (ref 39.0–52.0)
HEMOGLOBIN: 13.4 g/dL (ref 13.0–17.0)
MCH: 26.3 pg (ref 26.0–34.0)
MCHC: 32.2 g/dL (ref 30.0–36.0)
MCV: 81.7 fL (ref 78.0–100.0)
Platelets: 337 10*3/uL (ref 150–400)
RBC: 5.09 MIL/uL (ref 4.22–5.81)
RDW: 17.2 % — ABNORMAL HIGH (ref 11.5–15.5)
WBC: 6.2 10*3/uL (ref 4.0–10.5)

## 2013-08-26 LAB — BASIC METABOLIC PANEL
BUN: 19 mg/dL (ref 6–23)
CALCIUM: 9 mg/dL (ref 8.4–10.5)
CO2: 24 mEq/L (ref 19–32)
Chloride: 102 mEq/L (ref 96–112)
Creatinine, Ser: 1 mg/dL (ref 0.50–1.35)
GFR calc non Af Amer: 79 mL/min — ABNORMAL LOW (ref >90)
GLUCOSE: 103 mg/dL — AB (ref 70–99)
POTASSIUM: 4 meq/L (ref 3.7–5.3)
Sodium: 139 mEq/L (ref 137–147)

## 2013-08-26 NOTE — Pre-Procedure Instructions (Addendum)
Mark Martinez  08/26/2013   Your procedure is scheduled on:  08/31/13  Report to [facility] short stay admitting at 930 AM.  Call this number if you have problems the morning of surgery: [phone]   Remember:   Do not eat food or drink liquids after midnight.   Take these medicines the morning of surgery with A SIP OF WATER: tylenol if needed, nexium        STOP all herbel meds, nsaids (aleve,naproxen,advil,ibuprofen) 5 days prior to surgery including vitamins, aspirin   Do not wear jewelry, make-up or nail polish.  Do not wear lotions, powders, or perfumes. You may wear deodorant.  Do not shave 48 hours prior to surgery. Men may shave face and neck.  Do not bring valuables to the hospital.  [facility] is not responsible                  for any belongings or valuables.               Contacts, dentures or bridgework may not be worn into surgery.  Leave suitcase in the car. After surgery it may be brought to your room.  For patients admitted to the hospital, discharge time is determined by your                treatment team.               Patients discharged the day of surgery will not be allowed to drive  home.  Name and phone number of your driver:   Special Instructions:  Special Instructions: [place] - Preparing for Surgery  Before surgery, you can play an important role.  Because skin is not sterile, your skin needs to be as free of germs as possible.  You can reduce the number of germs on you skin by washing with CHG (chlorahexidine gluconate) soap before surgery.  CHG is an antiseptic cleaner which kills germs and bonds with the skin to continue killing germs even after washing.  Please DO NOT use if you have an allergy to CHG or antibacterial soaps.  If your skin becomes reddened/irritated stop using the CHG and inform your nurse when you arrive at Short Stay.  Do not shave (including legs and underarms) for at least 48 hours prior to the first CHG shower.  You may  shave your face.  Please follow these instructions carefully:   1.  Shower with CHG Soap the night before surgery and the morning of Surgery.  2.  If you choose to wash your hair, wash your hair first as usual with your normal shampoo.  3.  After you shampoo, rinse your hair and body thoroughly to remove the Shampoo.  4.  Use CHG as you would any other liquid soap.  You can apply chg directly  to the skin and wash gently with scrungie or a clean washcloth.  5.  Apply the CHG Soap to your body ONLY FROM THE NECK DOWN.  Do not use on open wounds or open sores.  Avoid contact with your eyes ears, mouth and genitals (private parts).  Wash genitals (private parts)       with your normal soap.  6.  Wash thoroughly, paying special attention to the area where your surgery will be performed.  7.  Thoroughly rinse your body with warm water from the neck down.  8.  DO NOT shower/wash with your normal soap after using and rinsing off the CHG  Soap.  9.  Pat yourself dry with a clean towel.            10.  Wear clean pajamas.            11.  Place clean sheets on your bed the night of your first shower and do not sleep with pets.  Day of Surgery  Do not apply any lotions/deodorants the morning of surgery.  Please wear clean clothes to the hospital/surgery center.   Please read over the following fact sheets that you were given: Pain Booklet, Coughing and Deep Breathing and Surgical Site Infection Prevention

## 2013-08-26 NOTE — Progress Notes (Signed)
Patient to have stress test fri 08/28/13 @1000  at [facility] in [address] [phone]. Please check epic/call if needed for results fri afternoon.

## 2013-08-27 NOTE — Progress Notes (Addendum)
Anesthesia Chart Review:  Patient is a 63 year old male scheduled for excision of epidermal inclusion cyst, left neck on 08/31/13 by Dr. Grandville Silos.    History includes non-smoker, ED, prostatitis, GERD, hiatal hernia, dyspnea on exertion, back surgery '94, esophageal dilation. PCP is Dr. Danise Mina.  He was recently evaluated by cardiologist Dr. Rockey Situ for progressive SOB. A stress echo was recommended which is scheduled for tomorrow. If this is normal, then he is not anticipating need for further testing.  EKG on 08/17/13 showed normal sinus rhythm with rate 70 beats a minute, no significant ST changes, nonspecific T wave abnormality in lead 3 and aVF.   Preoperative CXR and labs noted.   I sent a staff message to Dr. Rockey Situ (CC: Dr. Grandville Silos) notifying him of patient's plans for surgery on Monday in hopes that patient's stress echo report will be available in Epic prior to Monday morning.   [facility] Short Stay Center/Anesthesiology Phone [phone] 08/27/2013 12:08 PM  Addendum: 08/28/2013 4:39 PM:  See below note from Dr. Rockey Situ. Stress echocardiogram images reviewed  Excellent exercise tolerance  No significant EKG changes concerning for ischemia  No dramatic wall motion abnormalities seen on imaging  Final report suggests one view with small region of wall motion abnormality  This would not be an indication to cancel his procedure on Monday as he had very good exercise tolerance with predominantly normal stress test  Results will be discussed with the patient today and followup visit scheduled

## 2013-08-28 ENCOUNTER — Telehealth: Payer: Self-pay | Admitting: Cardiovascular Disease

## 2013-08-28 ENCOUNTER — Other Ambulatory Visit (INDEPENDENT_AMBULATORY_CARE_PROVIDER_SITE_OTHER): Payer: BC Managed Care – PPO

## 2013-08-28 DIAGNOSIS — R0609 Other forms of dyspnea: Secondary | ICD-10-CM

## 2013-08-28 DIAGNOSIS — R5383 Other fatigue: Secondary | ICD-10-CM

## 2013-08-28 DIAGNOSIS — R0989 Other specified symptoms and signs involving the circulatory and respiratory systems: Secondary | ICD-10-CM

## 2013-08-28 DIAGNOSIS — R9431 Abnormal electrocardiogram [ECG] [EKG]: Secondary | ICD-10-CM

## 2013-08-28 DIAGNOSIS — R0600 Dyspnea, unspecified: Secondary | ICD-10-CM

## 2013-08-28 NOTE — Telephone Encounter (Signed)
Stress echocardiogram images reviewed Excellent exercise tolerance No significant EKG changes concerning for ischemia No dramatic wall motion abnormalities seen on imaging Final report suggests one view with small region of wall motion abnormality  This would not be an indication to cancel his procedure on Monday as he had very good exercise tolerance with predominantly normal stress test Results will be discussed with the patient today and followup visit scheduled

## 2013-08-30 MED ORDER — CEFAZOLIN SODIUM-DEXTROSE 2-3 GM-% IV SOLR
2.0000 g | INTRAVENOUS | Status: AC
  Administered 2013-08-31: 2 g via INTRAVENOUS
  Filled 2013-08-30: qty 50

## 2013-08-31 ENCOUNTER — Encounter (HOSPITAL_COMMUNITY): Payer: BC Managed Care – PPO | Admitting: Vascular Surgery

## 2013-08-31 ENCOUNTER — Ambulatory Visit (HOSPITAL_COMMUNITY): Payer: BC Managed Care – PPO | Admitting: Certified Registered Nurse Anesthetist

## 2013-08-31 ENCOUNTER — Encounter (HOSPITAL_COMMUNITY): Payer: Self-pay | Admitting: *Deleted

## 2013-08-31 ENCOUNTER — Encounter (HOSPITAL_COMMUNITY)
Admission: RE | Disposition: A | Discharge status: Home / Self Care | Payer: Self-pay | Source: Ambulatory Visit | Attending: General Surgery

## 2013-08-31 ENCOUNTER — Ambulatory Visit (HOSPITAL_COMMUNITY)
Admission: RE | Admit: 2013-08-31 | Discharge: 2013-08-31 | Disposition: A | Discharge status: Home / Self Care | Payer: BC Managed Care – PPO | Source: Ambulatory Visit | Attending: General Surgery | Admitting: General Surgery

## 2013-08-31 DIAGNOSIS — K449 Diaphragmatic hernia without obstruction or gangrene: Secondary | ICD-10-CM | POA: Insufficient documentation

## 2013-08-31 DIAGNOSIS — N529 Male erectile dysfunction, unspecified: Secondary | ICD-10-CM | POA: Insufficient documentation

## 2013-08-31 DIAGNOSIS — L723 Sebaceous cyst: Secondary | ICD-10-CM

## 2013-08-31 DIAGNOSIS — I252 Old myocardial infarction: Secondary | ICD-10-CM | POA: Insufficient documentation

## 2013-08-31 DIAGNOSIS — L720 Epidermal cyst: Secondary | ICD-10-CM

## 2013-08-31 DIAGNOSIS — K219 Gastro-esophageal reflux disease without esophagitis: Secondary | ICD-10-CM | POA: Insufficient documentation

## 2013-08-31 DIAGNOSIS — I209 Angina pectoris, unspecified: Secondary | ICD-10-CM | POA: Insufficient documentation

## 2013-08-31 DIAGNOSIS — I509 Heart failure, unspecified: Secondary | ICD-10-CM | POA: Insufficient documentation

## 2013-08-31 DIAGNOSIS — I2510 Atherosclerotic heart disease of native coronary artery without angina pectoris: Secondary | ICD-10-CM | POA: Insufficient documentation

## 2013-08-31 DIAGNOSIS — E785 Hyperlipidemia, unspecified: Secondary | ICD-10-CM | POA: Insufficient documentation

## 2013-08-31 HISTORY — PX: CYST REMOVAL NECK: SHX6281

## 2013-08-31 SURGERY — EXCISION, CYST, NECK
Anesthesia: General | Site: Neck | Laterality: Left

## 2013-08-31 MED ORDER — CHLORHEXIDINE GLUCONATE 4 % EX LIQD
1.0000 "application " | Freq: Once | CUTANEOUS | Status: DC
  Filled 2013-08-31: qty 15

## 2013-08-31 MED ORDER — ONDANSETRON HCL 4 MG/2ML IJ SOLN
INTRAMUSCULAR | Status: DC | PRN
  Administered 2013-08-31: 4 mg via INTRAVENOUS

## 2013-08-31 MED ORDER — OXYCODONE-ACETAMINOPHEN 5-325 MG PO TABS: 1.0000 | ORAL_TABLET | ORAL | Status: DC | PRN

## 2013-08-31 MED ORDER — KETOROLAC TROMETHAMINE 30 MG/ML IJ SOLN
15.0000 mg | Freq: Once | INTRAMUSCULAR | Status: AC | PRN
  Administered 2013-08-31: 30 mg via INTRAVENOUS

## 2013-08-31 MED ORDER — FENTANYL CITRATE 0.05 MG/ML IJ SOLN
INTRAMUSCULAR | Status: DC | PRN
  Administered 2013-08-31: 100 ug via INTRAVENOUS

## 2013-08-31 MED ORDER — OXYCODONE-ACETAMINOPHEN 5-325 MG PO TABS
1.0000 | ORAL_TABLET | ORAL | Status: DC | PRN
  Administered 2013-08-31: 1 via ORAL

## 2013-08-31 MED ORDER — LACTATED RINGERS IV SOLN
INTRAVENOUS | Status: DC
  Administered 2013-08-31: 10:00:00 via INTRAVENOUS

## 2013-08-31 MED ORDER — LIDOCAINE HCL (CARDIAC) 20 MG/ML IV SOLN
INTRAVENOUS | Status: DC | PRN
  Administered 2013-08-31 (×2): 80 mg via INTRAVENOUS

## 2013-08-31 MED ORDER — OXYCODONE-ACETAMINOPHEN 5-325 MG PO TABS
ORAL_TABLET | ORAL | Status: AC
  Administered 2013-08-31: 1 via ORAL
  Filled 2013-08-31: qty 1

## 2013-08-31 MED ORDER — 0.9 % SODIUM CHLORIDE (POUR BTL) OPTIME
TOPICAL | Status: DC | PRN
  Administered 2013-08-31: 1000 mL

## 2013-08-31 MED ORDER — BUPIVACAINE-EPINEPHRINE 0.25% -1:200000 IJ SOLN
INTRAMUSCULAR | Status: DC | PRN
  Administered 2013-08-31: 15 mL

## 2013-08-31 MED ORDER — ACETAMINOPHEN 160 MG/5ML PO SOLN
325.0000 mg | ORAL | Status: DC | PRN
  Filled 2013-08-31: qty 20.3

## 2013-08-31 MED ORDER — PROPOFOL 10 MG/ML IV BOLUS
INTRAVENOUS | Status: DC | PRN
  Administered 2013-08-31 (×2): 200 mg via INTRAVENOUS

## 2013-08-31 MED ORDER — KETOROLAC TROMETHAMINE 30 MG/ML IJ SOLN
INTRAMUSCULAR | Status: AC
  Filled 2013-08-31: qty 1

## 2013-08-31 MED ORDER — ACETAMINOPHEN 325 MG PO TABS
ORAL_TABLET | ORAL | Status: AC
  Administered 2013-08-31: 325 mg via ORAL
  Filled 2013-08-31: qty 1

## 2013-08-31 MED ORDER — ACETAMINOPHEN 325 MG PO TABS
325.0000 mg | ORAL_TABLET | ORAL | Status: DC | PRN
  Administered 2013-08-31: 325 mg via ORAL

## 2013-08-31 MED ORDER — MIDAZOLAM HCL 5 MG/5ML IJ SOLN
INTRAMUSCULAR | Status: DC | PRN
  Administered 2013-08-31: 2 mg via INTRAVENOUS

## 2013-08-31 SURGICAL SUPPLY — 38 items
ADH SKN CLS APL DERMABOND .7 (GAUZE/BANDAGES/DRESSINGS) ×1
BLADE SURG ROTATE 9660 (MISCELLANEOUS) IMPLANT
CANISTER SUCTION 2500CC (MISCELLANEOUS) ×2 IMPLANT
CHLORAPREP W/TINT 10.5 ML (MISCELLANEOUS) ×2 IMPLANT
COVER SURGICAL LIGHT HANDLE (MISCELLANEOUS) ×2 IMPLANT
DECANTER SPIKE VIAL GLASS SM (MISCELLANEOUS) ×2 IMPLANT
DERMABOND ADVANCED (GAUZE/BANDAGES/DRESSINGS) ×1
DERMABOND ADVANCED .7 DNX12 (GAUZE/BANDAGES/DRESSINGS) IMPLANT
DRAPE EXTREMITY T 121X128X90 (DRAPE) IMPLANT
DRAPE LAPAROSCOPIC ABDOMINAL (DRAPES) IMPLANT
DRAPE PED LAPAROTOMY (DRAPES) ×2 IMPLANT
DRAPE UTILITY 15X26 W/TAPE STR (DRAPE) ×4 IMPLANT
ELECT CAUTERY BLADE 6.4 (BLADE) ×2 IMPLANT
ELECT REM PT RETURN 9FT ADLT (ELECTROSURGICAL) ×2
ELECTRODE REM PT RTRN 9FT ADLT (ELECTROSURGICAL) ×1 IMPLANT
GLOVE BIO SURGEON STRL SZ8 (GLOVE) ×2 IMPLANT
GLOVE BIOGEL PI IND STRL 8 (GLOVE) ×1 IMPLANT
GLOVE BIOGEL PI INDICATOR 8 (GLOVE) ×1
GOWN STRL REUS W/ TWL LRG LVL3 (GOWN DISPOSABLE) ×1 IMPLANT
GOWN STRL REUS W/ TWL XL LVL3 (GOWN DISPOSABLE) ×1 IMPLANT
GOWN STRL REUS W/TWL LRG LVL3 (GOWN DISPOSABLE) ×2
GOWN STRL REUS W/TWL XL LVL3 (GOWN DISPOSABLE) ×2
KIT BASIN OR (CUSTOM PROCEDURE TRAY) ×2 IMPLANT
KIT ROOM TURNOVER OR (KITS) ×2 IMPLANT
NEEDLE 22X1 1/2 (OR ONLY) (NEEDLE) ×2 IMPLANT
NS IRRIG 1000ML POUR BTL (IV SOLUTION) ×2 IMPLANT
PACK GENERAL/GYN (CUSTOM PROCEDURE TRAY) ×2 IMPLANT
PAD ARMBOARD 7.5X6 YLW CONV (MISCELLANEOUS) ×4 IMPLANT
SPECIMEN JAR MEDIUM (MISCELLANEOUS) ×2 IMPLANT
SPONGE GAUZE 4X4 12PLY (GAUZE/BANDAGES/DRESSINGS) ×2 IMPLANT
SUT MNCRL AB 4-0 PS2 18 (SUTURE) ×2 IMPLANT
SUT VIC AB 3-0 SH 27 (SUTURE) ×2
SUT VIC AB 3-0 SH 27X BRD (SUTURE) ×1 IMPLANT
SUT VICRYL AB 3 0 TIES (SUTURE) IMPLANT
SYR CONTROL 10ML LL (SYRINGE) ×1 IMPLANT
TOWEL OR 17X24 6PK STRL BLUE (TOWEL DISPOSABLE) ×2 IMPLANT
TOWEL OR 17X26 10 PK STRL BLUE (TOWEL DISPOSABLE) ×2 IMPLANT
WATER STERILE IRR 1000ML POUR (IV SOLUTION) ×2 IMPLANT

## 2013-08-31 NOTE — Transfer of Care (Signed)
Immediate Anesthesia Transfer of Care Note  Patient: Mark Martinez  Procedure(s) Performed: Procedure(s): EXCISION EPIDERMAL INCLUSION CYST LEFT NECK (Left)  Patient Location: PACU  Anesthesia Type:General  Level of Consciousness: awake, alert , oriented and patient cooperative  Airway & Oxygen Therapy: Pt spontaneously breathing  Post-op Assessment: Report given to PACU RN, Post -op Vital signs reviewed and stable and Patient moving all extremities X 4  Post vital signs: Reviewed and stable  Complications: No apparent anesthesia complications

## 2013-08-31 NOTE — Discharge Instructions (Signed)

## 2013-08-31 NOTE — H&P (View-Only) (Signed)
Patient ID: Mark Martinez, male   DOB: 08/22/1950, 63 y.o.   MRN: [id number]  Chief Complaint  Patient presents with  . New Evaluation    eval lipoma on left side of neck    HPI Mark Martinez is a 63 y.o. male.  Chief complaint: Mass left neck HPI Patient initially noticed a small pimple area on his left neck 2 years ago. It resolved but the area has gradually gotten larger over the past 2 years. It has not drained any fluid since then. No significant redness or pain. It is a little bit larger. I was asked to see him in consultation by Mark Martinez. Past Medical History  Diagnosis Date  . GERD (gastroesophageal reflux disease)     with hiatal hernia  . History of prostatitis   . ED (erectile dysfunction)   . HLD (hyperlipidemia)     Past Surgical History  Procedure Laterality Date  . Back surgery  1994    lumbar area  . Esophagogastroduodenoscopy  12-27-1996    hiatal hernia (Mark Martinez)  . Esophageal dilation      three last  . Colonoscopy  2005    WNL, rpt 10 yrs    Family History  Problem Relation Age of Onset  . Valvular heart disease Mother   . Ulcers Father   . CAD Father 65    multiple stents  . Cancer Neg Hx   . Diabetes Neg Hx   . Stroke Neg Hx   . CAD Paternal Uncle     Social History History  Substance Use Topics  . Smoking status: Never Smoker   . Smokeless tobacco: Former User    Quit date: 01/30/2009  . Alcohol Use: No    No Known Allergies  Current Outpatient Prescriptions  Medication Sig Dispense Refill  . NEXIUM 40 MG capsule TAKE 1 CAPSULE (40 MG TOTAL) BY MOUTH DAILY BEFORE BREAKFAST.  30 capsule  11  . tadalafil (CIALIS) 5 MG tablet Take 1 tablet (5 mg total) by mouth daily as needed.  30 tablet  6   No current facility-administered medications for this visit.    Review of Systems Review of Systems  Constitutional: Negative for fever, chills and unexpected weight change.  HENT: Negative for congestion, hearing loss, sore  throat, trouble swallowing and voice change.   Eyes: Negative for visual disturbance.  Respiratory: Negative for cough and wheezing.   Cardiovascular: Negative for chest pain, palpitations and leg swelling.  Gastrointestinal: Negative for nausea, vomiting, abdominal pain, diarrhea, constipation, blood in stool, abdominal distention, anal bleeding and rectal pain.  Genitourinary: Negative for hematuria and difficulty urinating.  Musculoskeletal: Negative for arthralgias.  Skin: Negative for rash and wound.       See history of present illness  Neurological: Negative for seizures, syncope, weakness and headaches.  Hematological: Negative for adenopathy. Does not bruise/bleed easily.  Psychiatric/Behavioral: Negative for confusion.    Blood pressure 138/90, pulse 80, temperature 98 F (36.7 C), temperature source Oral, resp. rate 14, height 6' 3" (1.905 m), weight 229 lb 9.6 oz (104.146 kg).  Physical Exam Physical Exam  Constitutional: He is oriented to person, place, and time. He appears well-developed and well-nourished.  HENT:  Head: Normocephalic and atraumatic.  Eyes: Conjunctivae and EOM are normal. Pupils are equal, round, and reactive to light. No scleral icterus.  Neck: Normal range of motion. No tracheal deviation present.    1.5 cm epidermal inclusion cyst with no drainage or cellulitis left neck    Cardiovascular: Normal rate, normal heart sounds and intact distal pulses.   Pulmonary/Chest: Effort normal and breath sounds normal. No stridor. No respiratory distress. He has no wheezes. He has no rales.  Abdominal: Soft. Bowel sounds are normal. He exhibits no distension. There is no tenderness. There is no rebound and no guarding.  Musculoskeletal: Normal range of motion. He exhibits no edema and no tenderness.  Neurological: He is alert and oriented to person, place, and time. He exhibits normal muscle tone. Coordination normal.  Skin: Skin is warm and dry.  See above    Psychiatric: He has a normal mood and affect.    Data Reviewed Referral  Assessment    Epidermal inclusion cyst left neck    Plan    I have offered excision in the operating room as an outpatient procedure. Procedure, risks, benefits were discussed in detail with the patient. 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 We discussed the expected postoperative course.       Mark Martinez 08/12/2013, 10:00 AM

## 2013-08-31 NOTE — Preoperative (Signed)
Beta Blockers   Reason not to administer Beta Blockers:Not Applicable 

## 2013-08-31 NOTE — Op Note (Addendum)
08/31/2013  12:47 PM  PATIENT:  Mark Martinez  63 y.o. male  PRE-OPERATIVE DIAGNOSIS:  left neck cyst 1.5cm  POST-OPERATIVE DIAGNOSIS:  left neck cyst 1.5cm  PROCEDURE:  Procedure(s): EXCISION EPIDERMAL INCLUSION CYST LEFT NECK, 1.5CM  SURGEON:  Surgeon(s): Zenovia Jarred, MD  ASSISTANTS: none   ANESTHESIA:   local and general  EBL:  Total I/O In: 900 [I.V.:900] Out: 5 [Blood:5]  BLOOD ADMINISTERED:none  DRAINS: none   SPECIMEN:  Excision  DISPOSITION OF SPECIMEN:  PATHOLOGY  COUNTS:  YES  DICTATION: .Dragon Dictation  Patient presents for excision of cyst left neck. He Was identified in the preop holding area. His site was marked. Informed consent was obtained.He received intravenous antibiotics. He was brought to the operating room and general anesthesia With laryngeal mask airway was administered by the anesthesia staff His left neck was prepped and draped in sterile fashion. We did time out procedure. Local anesthetic was injected cutaneously around a palpable cyst. An incision was made along tissue lines. Subcutaneous tissues were dissected revealing the cyst. It was circumferentially dissected out without rupturing. It was sent to pathology. Cautery was used to get hemostasis in the wound. Additional local anesthetic was injected. Wound was irrigated. There was good hemostasis. Wound was closed with running 4-0 Monocryl followed by Dermabond. All counts were correct. Patient tolerated procedure well without apparent complication and was taken to recovery room in stable condition.  PATIENT DISPOSITION:  PACU - hemodynamically stable.   Delay start of Pharmacological VTE agent (>24hrs) due to surgical blood loss or risk of bleeding:  no  Georganna Skeans, MD, MPH, FACS Pager: [phone]  3/16/201512:47 PM

## 2013-08-31 NOTE — Anesthesia Preprocedure Evaluation (Addendum)
Anesthesia Evaluation  Patient identified by MRN, date of birth, ID band Patient awake    Reviewed: Allergy & Precautions, H&P , NPO status , Patient's Chart, lab work & pertinent test results  History of Anesthesia Complications Negative for: history of anesthetic complications  Airway Mallampati: I TM Distance: >3 FB Neck ROM: Full    Dental  (+) Teeth Intact   Pulmonary neg sleep apnea, neg COPDneg recent URI,  breath sounds clear to auscultation        Cardiovascular - angina- CAD, - Past MI, - CHF and - DOE negative cardio ROS  Rhythm:Regular Rate:Normal     Neuro/Psych negative neurological ROS  negative psych ROS   GI/Hepatic Neg liver ROS, hiatal hernia, GERD-  Medicated and Controlled,  Endo/Other  negative endocrine ROS  Renal/GU negative Renal ROS     Musculoskeletal negative musculoskeletal ROS (+)   Abdominal   Peds  Hematology negative hematology ROS (+)   Anesthesia Other Findings   Reproductive/Obstetrics                          Anesthesia Physical Anesthesia Plan  ASA: II  Anesthesia Plan: General   Post-op Pain Management:    Induction: Intravenous  Airway Management Planned: Oral ETT  Additional Equipment: None  Intra-op Plan:   Post-operative Plan: Extubation in OR  Informed Consent: I have reviewed the patients History and Physical, chart, labs and discussed the procedure including the risks, benefits and alternatives for the proposed anesthesia with the patient or authorized representative who has indicated his/her understanding and acceptance.   Dental advisory given  Plan Discussed with: CRNA and Surgeon  Anesthesia Plan Comments:         Anesthesia Quick Evaluation

## 2013-08-31 NOTE — Interval H&P Note (Signed)
History and Physical Interval Note:  08/31/2013 11:39 AM  Mark Martinez  has presented today for surgery, with the diagnosis of left neck cyst  The various methods of treatment have been discussed with the patient and family. After consideration of risks, benefits and other options for treatment, the patient has consented to  Procedure(s): EXCISION EPIDERMAL INCLUSION CYST LEFT NECK (Left) as a surgical intervention .  The patient's history has been reviewed, patient re-examined, site marked,no change in status, stable for surgery.  I have reviewed the patient's chart and labs.  Questions were answered to the patient's satisfaction.     Liyat Faulkenberry E

## 2013-08-31 NOTE — Anesthesia Postprocedure Evaluation (Signed)
Anesthesia Post Note  Patient: Mark Martinez  Procedure(s) Performed: Procedure(s) (LRB): EXCISION EPIDERMAL INCLUSION CYST LEFT NECK (Left)  Anesthesia type: general  Patient location: PACU  Post pain: Pain level controlled  Post assessment: Patient's Cardiovascular Status Stable  Last Vitals:  Filed Vitals:   08/31/13 1330  BP: 113/76  Pulse: 64  Temp:   Resp: 13    Post vital signs: Reviewed and stable  Level of consciousness: sedated  Complications: No apparent anesthesia complications

## 2013-09-01 ENCOUNTER — Encounter (HOSPITAL_COMMUNITY): Payer: Self-pay | Admitting: General Surgery

## 2013-09-01 NOTE — Anesthesia Postprocedure Evaluation (Signed)
  Anesthesia Post-op Note  Patient: Mark Martinez  Procedure(s) Performed: Procedure(s): EXCISION EPIDERMAL INCLUSION CYST LEFT NECK (Left)  Patient Location: PACU  Anesthesia Type:General  Level of Consciousness: awake, alert  and oriented  Airway and Oxygen Therapy: Patient Spontanous Breathing  Post-op Pain: mild  Post-op Assessment: Post-op Vital signs reviewed, Patient's Cardiovascular Status Stable, Respiratory Function Stable, Patent Airway, No signs of Nausea or vomiting and Pain level controlled  Post-op Vital Signs: Reviewed and stable  Complications: No apparent anesthesia complications

## 2013-09-03 ENCOUNTER — Telehealth (INDEPENDENT_AMBULATORY_CARE_PROVIDER_SITE_OTHER): Payer: Self-pay

## 2013-09-03 NOTE — Telephone Encounter (Signed)
Pt home doing well. Pt given po appt and path result.

## 2013-09-16 ENCOUNTER — Encounter (INDEPENDENT_AMBULATORY_CARE_PROVIDER_SITE_OTHER): Payer: BC Managed Care – PPO | Admitting: General Surgery

## 2013-09-23 ENCOUNTER — Ambulatory Visit (INDEPENDENT_AMBULATORY_CARE_PROVIDER_SITE_OTHER): Payer: BC Managed Care – PPO | Admitting: General Surgery

## 2013-09-23 ENCOUNTER — Encounter (INDEPENDENT_AMBULATORY_CARE_PROVIDER_SITE_OTHER): Payer: Self-pay | Admitting: General Surgery

## 2013-09-23 VITALS — BP 118/78 | HR 78 | Temp 97.7°F | Resp 16 | Ht 75.0 in | Wt 223.6 lb

## 2013-09-23 DIAGNOSIS — L720 Epidermal cyst: Secondary | ICD-10-CM

## 2013-09-23 DIAGNOSIS — L723 Sebaceous cyst: Secondary | ICD-10-CM

## 2013-09-23 NOTE — Progress Notes (Signed)
Subjective:     Patient ID: Mark Martinez, male   DOB: Mar 14, 1951, 63 y.o.   MRN: [id number]  HPI Patient presents status post excision of epidermal inclusion cyst left neck. He is doing well postoperatively. He has some numbness superior to the incision up towards his ear. He initially had some swelling but this has resolved.  Review of Systems     Objective:   Physical Exam Incision is clean dry intact and healing well. There is some decreased light touch sensation superior to the incision.    Assessment:     Doing well postoperatively    Plan:     Numbness should resolve as peripheral nerves recover or regrow. Return as needed.Pathology report was reviewed with him.

## 2013-09-24 ENCOUNTER — Encounter: Payer: Self-pay | Admitting: Family Medicine

## 2013-10-27 ENCOUNTER — Encounter: Payer: Self-pay | Admitting: Family Medicine

## 2013-10-27 ENCOUNTER — Ambulatory Visit (INDEPENDENT_AMBULATORY_CARE_PROVIDER_SITE_OTHER): Payer: BC Managed Care – PPO | Admitting: Family Medicine

## 2013-10-27 VITALS — BP 114/78 | HR 84 | Temp 97.5°F | Wt 223.5 lb

## 2013-10-27 DIAGNOSIS — M7520 Bicipital tendinitis, unspecified shoulder: Secondary | ICD-10-CM

## 2013-10-27 DIAGNOSIS — M7522 Bicipital tendinitis, left shoulder: Secondary | ICD-10-CM

## 2013-10-27 MED ORDER — TADALAFIL 5 MG PO TABS: 5.0000 mg | ORAL_TABLET | Freq: Every day | ORAL | Status: DC | PRN

## 2013-10-27 MED ORDER — NAPROXEN 500 MG PO TABS: ORAL_TABLET | ORAL | Status: DC

## 2013-10-27 NOTE — Progress Notes (Signed)
Pre visit review using our clinic review tool, if applicable. No additional management support is needed unless otherwise documented below in the visit note. 

## 2013-10-27 NOTE — Patient Instructions (Signed)
You have biceps tendonitis - treat with naprosyn twice daily for 5 days (with food) then as needed for pain. Exercises for biceps tendonitis provided today as well. Let us know if not improving, may have you see sports medicine doctor.

## 2013-10-27 NOTE — Progress Notes (Signed)
   BP 114/78  Pulse 84  Temp(Src) 97.5 F (36.4 C) (Oral)  Wt 223 lb 8 oz (101.379 kg)   CC: L shoulder pain x 10 days  Subjective:    Patient ID: Mark Martinez, male    DOB: Nov 15, 1950, 63 y.o.   MRN: [id number]  HPI: Mark Martinez is a 63 y.o. male presenting on 10/27/2013 for Shoulder Pain  Pain started after he changed tire  Endorses L arm soreness with intermittent shooting pain down left upper arm when reaching outward.  Denies inciting trauma/injury.  Describes shooting pain from lateral shoulder 4-5 inches down arm (not quite reaching elbow).  No neck pain, chest pain, fevers/chills, denies weakness/numbness down arm. Treating with tylenol and heat - didn't help. Works in Biomedical scientist.  No h/o shoulder surgeries, no shoulder injuries in past.  Relevant past medical, surgical, family and social history reviewed and updated as indicated.  Allergies and medications reviewed and updated. Current Outpatient Prescriptions on File Prior to Visit  Medication Sig  . acetaminophen (TYLENOL) 325 MG tablet Take 650 mg by mouth every 6 (six) hours as needed for moderate pain.   Marland Kitchen esomeprazole (NEXIUM) 40 MG capsule Take 40 mg by mouth daily at 12 noon.   No current facility-administered medications on file prior to visit.    Review of Systems Per HPI unless specifically indicated above    Objective:    BP 114/78  Pulse 84  Temp(Src) 97.5 F (36.4 C) (Oral)  Wt 223 lb 8 oz (101.379 kg)  Physical Exam  Nursing note and vitals reviewed. Constitutional: He appears well-developed and well-nourished. No distress.  Musculoskeletal: He exhibits no edema.  FROM at neck. R shoulder WNL L Shoulder exam: Carries L shoulder slightly depressed compared to right. No pain with palpation of shoulder landmarks. FROM in abduction and forward flexion although some pain endorsed with abduction at 90degrees. No pain or weakness with testing SITS in ext/int rotation. No pain with empty can  sign. + Yerguson, Speed test. No impingement. No pain with crossover test. No pain with rotation of humeral head in Mower joint.       Assessment & Plan:   Problem List Items Addressed This Visit   Biceps tendonitis on left - Primary     Exam consistent with L biceps tendonitis. See pt instructions for plan. Treat with naprosyn course, exercises from [facility] pt advisor If not improving, consider referral to [facility].        Follow up plan: Return if symptoms worsen or fail to improve.

## 2013-10-27 NOTE — Assessment & Plan Note (Signed)
Exam consistent with L biceps tendonitis. See pt instructions for plan. Treat with naprosyn course, exercises from [facility] pt advisor If not improving, consider referral to [facility].

## 2013-12-25 ENCOUNTER — Encounter: Payer: Self-pay | Admitting: Family Medicine

## 2013-12-25 ENCOUNTER — Ambulatory Visit (INDEPENDENT_AMBULATORY_CARE_PROVIDER_SITE_OTHER): Payer: BC Managed Care – PPO | Admitting: Family Medicine

## 2013-12-25 VITALS — BP 116/82 | HR 80 | Temp 98.1°F | Wt 222.5 lb

## 2013-12-25 DIAGNOSIS — J029 Acute pharyngitis, unspecified: Secondary | ICD-10-CM

## 2013-12-25 LAB — POCT RAPID STREP A (OFFICE): Rapid Strep A Screen: NEGATIVE

## 2013-12-25 MED ORDER — TADALAFIL 5 MG PO TABS: 5.0000 mg | ORAL_TABLET | Freq: Every day | ORAL | Status: DC | PRN

## 2013-12-25 NOTE — Progress Notes (Signed)
Pre visit review using our clinic review tool, if applicable. No additional management support is needed unless otherwise documented below in the visit note. 

## 2013-12-25 NOTE — Assessment & Plan Note (Signed)
Anticipate viral, RST negative Discussed anticipated progression and resolution of sxs rec ibuprofen and supportive care with salt water gargles and honey with lemon. Update if not improving or any red flags.

## 2013-12-25 NOTE — Progress Notes (Signed)
   BP 116/82  Pulse 80  Temp(Src) 98.1 F (36.7 C) (Oral)  Wt 222 lb 8 oz (100.925 kg)   CC: ST  Subjective:    Patient ID: Mark Martinez, male    DOB: 1951/06/18, 64 y.o.   MRN: [id number]  HPI: Mark Martinez is a 63 y.o. male presenting on 12/25/2013 for Sore Throat   2d h/o ST with mild headache. Mild cough (tickle in back of throat).   No fevers/chills, congestion or rhinorrhea. No ear or tooth pain, abd pain, nausea. No PNdrainage. So far has tried tylenol and sinus medicine.  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 No smokers at home. No h/o asthma or allergies H/o GERD on nexium 40mg  dialy.  Relevant past medical, surgical, family and social history reviewed and updated as indicated.  Allergies and medications reviewed and updated. Current Outpatient Prescriptions on File Prior to Visit  Medication Sig  . acetaminophen (TYLENOL) 325 MG tablet Take 650 mg by mouth every 6 (six) hours as needed for moderate pain.   Marland Kitchen esomeprazole (NEXIUM) 40 MG capsule Take 40 mg by mouth daily at 12 noon.   No current facility-administered medications on file prior to visit.    Review of Systems Per HPI unless specifically indicated above    Objective:    BP 116/82  Pulse 80  Temp(Src) 98.1 F (36.7 C) (Oral)  Wt 222 lb 8 oz (100.925 kg)  Physical Exam  Nursing note and vitals reviewed. Constitutional: He appears well-developed and well-nourished. No distress.  HENT:  Head: Normocephalic and atraumatic.  Right Ear: Hearing, external ear and ear canal normal. Tympanic membrane is injected (superior).  Left Ear: Hearing, tympanic membrane, external ear and ear canal normal.  Nose: Nose normal. No mucosal edema or rhinorrhea. Right sinus exhibits no maxillary sinus tenderness and no frontal sinus tenderness. Left sinus exhibits no maxillary sinus tenderness and no frontal sinus tenderness.  Mouth/Throat: Uvula is midline and mucous membranes are normal. Posterior  oropharyngeal edema and posterior oropharyngeal erythema present. No oropharyngeal exudate or tonsillar abscesses.  Eyes: Conjunctivae and EOM are normal. Pupils are equal, round, and reactive to light. No scleral icterus.  Neck: Normal range of motion. Neck supple. No thyromegaly present.  Cardiovascular: Normal rate, regular rhythm, normal heart sounds and intact distal pulses.   No murmur heard. Pulmonary/Chest: Effort normal and breath sounds normal. No respiratory distress. He has no wheezes. He has no rales.  Lymphadenopathy:    He has no cervical adenopathy.  Skin: Skin is warm and dry. No rash noted.       Assessment & Plan:  cialis coupon provided with script for 3 pills.  Problem List Items Addressed This Visit   Viral pharyngitis     Anticipate viral, RST negative Discussed anticipated progression and resolution of sxs rec ibuprofen and supportive care with salt water gargles and honey with lemon. Update if not improving or any red flags.     Other Visit Diagnoses   Sore throat    -  Primary    Relevant Orders       POCT rapid strep A (Completed)        Follow up plan: Return if symptoms worsen or fail to improve.

## 2013-12-25 NOTE — Patient Instructions (Signed)
You have pharyngitis, likely viral. Push fluids and plenty of rest. May use ibuprofen 400mg  twice daily with food for throat inflammation. Salt water gargles. Suck on cold things like popsicles or warm things like herbal teas (whichever soothes the throat better). Return if fever >101.5, worsening pain, or trouble opening/closing mouth, or hoarse voice. Good to see you today, call clinic with questions.

## 2013-12-29 ENCOUNTER — Telehealth: Payer: Self-pay | Admitting: Family Medicine

## 2013-12-29 NOTE — Telephone Encounter (Signed)
Pt dropped of Physicians Results Forms to be completed. Please call pt when finished so that he can pick them up.

## 2013-12-30 NOTE — Telephone Encounter (Signed)
Form in your IN box for completion. Will require waist circumference.

## 2013-12-31 NOTE — Telephone Encounter (Signed)
Patient notified and will ask to see me to measure waist when he come to pick up paperwork.

## 2013-12-31 NOTE — Telephone Encounter (Signed)
Filled and placed in Kim's box. 

## 2014-02-15 ENCOUNTER — Ambulatory Visit (INDEPENDENT_AMBULATORY_CARE_PROVIDER_SITE_OTHER): Payer: BC Managed Care – PPO | Admitting: Internal Medicine

## 2014-02-15 ENCOUNTER — Encounter: Payer: Self-pay | Admitting: Internal Medicine

## 2014-02-15 VITALS — BP 142/90 | HR 87 | Temp 98.2°F | Wt 220.0 lb

## 2014-02-15 DIAGNOSIS — J209 Acute bronchitis, unspecified: Secondary | ICD-10-CM

## 2014-02-15 MED ORDER — AZITHROMYCIN 250 MG PO TABS: ORAL_TABLET | ORAL | Status: DC

## 2014-02-15 MED ORDER — HYDROCODONE-HOMATROPINE 5-1.5 MG/5ML PO SYRP: 5.0000 mL | ORAL_SOLUTION | Freq: Three times a day (TID) | ORAL | Status: DC | PRN

## 2014-02-15 NOTE — Progress Notes (Signed)
Pre visit review using our clinic review tool, if applicable. No additional management support is needed unless otherwise documented below in the visit note. 

## 2014-02-15 NOTE — Progress Notes (Signed)
HPI  Pt presents to the clinic today with c/o cough, chest congestion, fever. He reports this started 2 days ago. He is coughing up thick brown mucous. He has had some difficulty breathing, especially when he lays down. He has tried mucinex and ibuprofen without much relief. He has no history of allergies or breathing problems. He has not had sick contacts that he is aware of.  Review of Systems      Past Medical History  Diagnosis Date  . GERD (gastroesophageal reflux disease)     with hiatal hernia  . History of prostatitis   . ED (erectile dysfunction)   . HLD (hyperlipidemia)   . H/O hiatal hernia   . Shortness of breath     exersion    Family History  Problem Relation Age of Onset  . Valvular heart disease Mother   . Ulcers Father   . CAD Father 79    multiple stents  . Cancer Neg Hx   . Diabetes Neg Hx   . Stroke Neg Hx   . CAD Paternal Uncle     History   Social History  . Marital Status: Married    Spouse Name: N/A    Number of Children: 3  . Years of Education: N/A   Occupational History  . seeding and landscaping services Other   Social History Main Topics  . Smoking status: Never Smoker   . Smokeless tobacco: Former Systems developer    Quit date: 01/30/2009  . Alcohol Use: No  . Drug Use: No  . Sexual Activity: Not on file   Other Topics Concern  . Not on file   Social History Narrative   Caffeine: rare   Lives with wife, 1 dog.   Grown children   Occupation: Works in Biomedical scientist.   Activity: stays active at work   Diet: daily fruits/vegetables, some water    No Known Allergies   Constitutional: Positive headache, fatigue and fever. Denies abrupt weight changes.  HEENT:  Positive sore throat. Denies eye redness, eye pain, pressure behind the eyes, facial pain, nasal congestion, ear pain, ringing in the ears, wax buildup, runny nose or bloody nose. Respiratory: Positive cough. Denies difficulty breathing or shortness of breath.  Cardiovascular:  Denies chest pain, chest tightness, palpitations or swelling in the hands or feet.   No other specific complaints in a complete review of systems (except as listed in HPI above).  Objective:   BP 142/90  Pulse 87  Temp(Src) 98.2 F (36.8 C) (Oral)  Wt 220 lb (99.791 kg)  SpO2 96% Wt Readings from Last 3 Encounters:  02/15/14 220 lb (99.791 kg)  12/25/13 222 lb 8 oz (100.925 kg)  10/27/13 223 lb 8 oz (101.379 kg)     General: Appears his stated age, ill appearing in NAD. HEENT: Head: normal shape and size; Ears: Tm's gray and intact, normal light reflex; Throat/Mouth: + PND. Teeth present, mucosa erythematous and moist, no exudate noted, no lesions or ulcerations noted.  Neck: Mild cervical lymphadenopathy. Neck supple, trachea midline. No massses, lumps or thyromegaly present.  Cardiovascular: Normal rate and rhythm. S1,S2 noted.  No murmur, rubs or gallops noted. No JVD or BLE edema. No carotid bruits noted. Pulmonary/Chest: Normal effort and scattered rhonchi and bilateral intermittent expiratory wheeze noted. No respiratory distress.      Assessment & Plan:   Acute Bronchitis:  Get some rest and drink plenty of water Do salt water gargles for the sore throat eRx for Azithromax x 5  days eRx for Hycodan cough syrup  RTC as needed or if symptoms persist.

## 2014-02-15 NOTE — Patient Instructions (Addendum)

## 2014-02-19 ENCOUNTER — Telehealth: Payer: Self-pay

## 2014-02-19 MED ORDER — ALBUTEROL SULFATE HFA 108 (90 BASE) MCG/ACT IN AERS: 2.0000 | INHALATION_SPRAY | Freq: Four times a day (QID) | RESPIRATORY_TRACT | Status: DC | PRN

## 2014-02-19 NOTE — Telephone Encounter (Signed)
May try albuterol inhaler sent in for wheezing. Call us Monday with an update.

## 2014-02-19 NOTE — Telephone Encounter (Signed)
Is hycodan cough syrup helping him sleep at night time? zpack is long acting antibiotic would give this more time - ensure he is taking plain mucinex twice daily with 1 large glass of water for congestion as well. Any wheezing? How is dyspnea?

## 2014-02-19 NOTE — Telephone Encounter (Signed)
Patient notified

## 2014-02-19 NOTE — Telephone Encounter (Signed)
Message left for patient to return my call.  

## 2014-02-19 NOTE — Telephone Encounter (Signed)
Spoke with patient. Hycodan is helping him sleep. He hasn't been taking mucinex at all. Advised to start that with plenty of water to help break up congestion. He said dyspnea was slightly better, but not much. He also said he felt like he was wheezing at night and in the morning, but not during the day.

## 2014-02-19 NOTE — Telephone Encounter (Signed)
Pt left v/m; pt was seen on 02/15/14 with bronchitis; pt will take last zithromax tab today; pt is still very congested in chest and head; pt request refill of zpak or different antibiotic to [facility]. Please advise. Webb Silversmith NP not on computer. Please advise.

## 2014-05-27 ENCOUNTER — Ambulatory Visit (INDEPENDENT_AMBULATORY_CARE_PROVIDER_SITE_OTHER): Payer: BC Managed Care – PPO | Admitting: Family Medicine

## 2014-05-27 ENCOUNTER — Encounter: Payer: Self-pay | Admitting: Family Medicine

## 2014-05-27 VITALS — BP 112/80 | HR 72 | Temp 98.0°F | Wt 220.8 lb

## 2014-05-27 DIAGNOSIS — R498 Other voice and resonance disorders: Secondary | ICD-10-CM | POA: Insufficient documentation

## 2014-05-27 DIAGNOSIS — J392 Other diseases of pharynx: Secondary | ICD-10-CM | POA: Insufficient documentation

## 2014-05-27 MED ORDER — TADALAFIL 5 MG PO TABS: 5.0000 mg | ORAL_TABLET | Freq: Every day | ORAL | Status: DC | PRN

## 2014-05-27 MED ORDER — TADALAFIL 5 MG PO TABS: 5.0000 mg | ORAL_TABLET | Freq: Every day | ORAL | Status: DC

## 2014-05-27 NOTE — Progress Notes (Signed)
   BP 112/80 mmHg  Pulse 72  Temp(Src) 98 F (36.7 C) (Oral)  Wt 220 lb 12 oz (100.132 kg)   CC: throat trouble  Subjective:    Patient ID: Mark Martinez, male    DOB: February 18, 1951, 63 y.o.   MRN: [id number]  HPI: Mark Martinez is a 63 y.o. male presenting on 05/27/2014 for Throat problems   1 mo ago had 2 episodes 3 wks apart - when falling asleep, felt throat "close up". Sat up and had fleeting loss of wind.  No dyspnea with this. Notices persistent trouble when raises his voice - notices loses his voice for a few seconds. Possible voice fatigue with prolonged conversations esp whenever it gets dry. Increased peppermint use due to this.   No dysphagia, sore throat, fevers/chills, appetite changes or weight loss.  GERD with HH - on nexium 40mg  daily. Denies breakthrough GERD sxs.  Never smoker, has used smokeless tobacco for 40 yrs, quit 4 yrs ago.  Relevant past medical, surgical, family and social history reviewed and updated as indicated. Interim medical history since our last visit reviewed. Allergies and medications reviewed and updated.  Current Outpatient Prescriptions on File Prior to Visit  Medication Sig  . acetaminophen (TYLENOL) 325 MG tablet Take 650 mg by mouth every 6 (six) hours as needed for moderate pain.   Marland Kitchen esomeprazole (NEXIUM) 40 MG capsule Take 40 mg by mouth daily at 12 noon.   No current facility-administered medications on file prior to visit.    Review of Systems Per HPI unless specifically indicated above     Objective:    BP 112/80 mmHg  Pulse 72  Temp(Src) 98 F (36.7 C) (Oral)  Wt 220 lb 12 oz (100.132 kg)  Wt Readings from Last 3 Encounters:  05/27/14 220 lb 12 oz (100.132 kg)  02/15/14 220 lb (99.791 kg)  12/25/13 222 lb 8 oz (100.925 kg)    Physical Exam  Constitutional: He appears well-developed and well-nourished. No distress.  HENT:  Head: Normocephalic and atraumatic.  Mouth/Throat: Uvula is midline, oropharynx is clear and  moist and mucous membranes are normal. No oropharyngeal exudate, posterior oropharyngeal edema, posterior oropharyngeal erythema or tonsillar abscesses.  No abnormality appreciated today on oropharyngeal exam  Eyes: EOM are normal. Pupils are equal, round, and reactive to light.  Neck: Normal range of motion. Neck supple. No thyromegaly present.  Lymphadenopathy:    He has no cervical adenopathy.  Nursing note and vitals reviewed.  Results for orders placed or performed in visit on 12/25/13  POCT rapid strep A  Result Value Ref Range   Rapid Strep A Screen Negative Negative      Assessment & Plan:   Problem List Items Addressed This Visit    Spasm of throat - Primary    With ?voice fatigue.  Possibly just due to dry mouth - suggested sour candy, staying well hydrated. ?GERD related - advised stop mints and discussed other dietary changes to keep GERD controlled. Continue nexium 40mg  daily. In smokeless tobacco history reasonable to refer to ENT for further evaluation. Pt agrees.    Relevant Orders      Ambulatory referral to ENT       Follow up plan: Return if symptoms worsen or fail to improve.

## 2014-05-27 NOTE — Progress Notes (Signed)
Pre visit review using our clinic review tool, if applicable. No additional management support is needed unless otherwise documented below in the visit note. 

## 2014-05-27 NOTE — Assessment & Plan Note (Signed)
With ?voice fatigue.  Possibly just due to dry mouth - suggested sour candy, staying well hydrated. ?GERD related - advised stop mints and discussed other dietary changes to keep GERD controlled. Continue nexium 40mg  daily. In smokeless tobacco history reasonable to refer to ENT for further evaluation. Pt agrees.

## 2014-05-27 NOTE — Patient Instructions (Signed)
Exam looking ok today. Pass by Marion's office for referral to ENT. Good to see you, call us with quesitons.

## 2014-06-29 ENCOUNTER — Encounter: Payer: Self-pay | Admitting: Gastroenterology

## 2014-07-12 ENCOUNTER — Other Ambulatory Visit: Payer: Self-pay | Admitting: Family Medicine

## 2014-07-13 MED ORDER — ESOMEPRAZOLE MAGNESIUM 40 MG PO CPDR: 40.0000 mg | DELAYED_RELEASE_CAPSULE | Freq: Every day | ORAL | Status: DC

## 2014-07-13 NOTE — Telephone Encounter (Signed)
Ok to do generic nexium. Sent in.

## 2014-07-13 NOTE — Telephone Encounter (Signed)
Pt said when [facility] Dr Oswaldo Conroy generic nexium. Pt wants to know if he should switch to generic Nexium. Pt request cb.

## 2014-07-13 NOTE — Telephone Encounter (Signed)
Spoke with patient and notified him that generic was equivalent and it would be fine to take. Advised that [place] required generic substitution if one was available unless name-brand is specifically required. Advised to call me if he should have any problems. Pt verbalized understanding.

## 2014-07-13 NOTE — Addendum Note (Signed)
Addended by: Ria Bush on: 07/13/2014 05:44 PM   Modules accepted: Orders, Medications

## 2014-08-11 ENCOUNTER — Encounter: Payer: Self-pay | Admitting: Family Medicine

## 2014-08-11 ENCOUNTER — Ambulatory Visit (INDEPENDENT_AMBULATORY_CARE_PROVIDER_SITE_OTHER): Payer: BLUE CROSS/BLUE SHIELD | Admitting: Family Medicine

## 2014-08-11 ENCOUNTER — Telehealth: Payer: Self-pay

## 2014-08-11 VITALS — BP 124/78 | HR 72 | Temp 98.3°F | Wt 224.2 lb

## 2014-08-11 DIAGNOSIS — R053 Chronic cough: Secondary | ICD-10-CM | POA: Insufficient documentation

## 2014-08-11 DIAGNOSIS — J209 Acute bronchitis, unspecified: Secondary | ICD-10-CM

## 2014-08-11 DIAGNOSIS — R05 Cough: Secondary | ICD-10-CM | POA: Insufficient documentation

## 2014-08-11 MED ORDER — GUAIFENESIN-CODEINE 100-10 MG/5ML PO SYRP: 1.0000 mL | ORAL_SOLUTION | Freq: Every evening | ORAL | Status: DC | PRN

## 2014-08-11 MED ORDER — ALBUTEROL SULFATE HFA 108 (90 BASE) MCG/ACT IN AERS: 2.0000 | INHALATION_SPRAY | Freq: Four times a day (QID) | RESPIRATORY_TRACT | Status: DC | PRN

## 2014-08-11 MED ORDER — AZITHROMYCIN 250 MG PO TABS: ORAL_TABLET | ORAL | Status: DC

## 2014-08-11 NOTE — Patient Instructions (Signed)
I think you have bronchitis with inflammation in lungs - treat with zpack, albuterol inhaler as needed, and codeine cough syrup for night time. Push fluids and rest. Continue mucinex and tylenol. Let us know if fever >101, worsening productive cough, or any shortness of breath or worsening wheezing.

## 2014-08-11 NOTE — Progress Notes (Signed)
Pre visit review using our clinic review tool, if applicable. No additional management support is needed unless otherwise documented below in the visit note. 

## 2014-08-11 NOTE — Progress Notes (Signed)
   BP 124/78 mmHg  Pulse 72  Temp(Src) 98.3 F (36.8 C) (Oral)  Wt 224 lb 4 oz (101.719 kg)  SpO2 95%   CC: cough, congestion Subjective:    Patient ID: Mark Martinez, male    DOB: [birth year], 64 y.o.   MRN: [id number]  HPI: Mark Martinez is a 64 y.o. male presenting on 08/11/2014 for Sinusitis   2+ wk h/o mildly productive cough and chest to head congestion. Initially with fever but now improved. + coughing fits.   No current fevers/chills. No ear or tooth pain, PNdrainage, ST.   Has been taking mucinex, cough syrup and tylenol without significant improvement. No ho asthma no smoking history or exposure. No sick contacts.  Relevant past medical, surgical, family and social history reviewed and updated as indicated. Interim medical history since our last visit reviewed. Allergies and medications reviewed and updated. Current Outpatient Prescriptions on File Prior to Visit  Medication Sig  . acetaminophen (TYLENOL) 325 MG tablet Take 650 mg by mouth every 6 (six) hours as needed for moderate pain.   Marland Kitchen esomeprazole (NEXIUM) 40 MG capsule Take 1 capsule (40 mg total) by mouth daily at 12 noon.  . tadalafil (CIALIS) 5 MG tablet Take 1 tablet (5 mg total) by mouth daily as needed for erectile dysfunction.   No current facility-administered medications on file prior to visit.    Review of Systems Per HPI unless specifically indicated above     Objective:    BP 124/78 mmHg  Pulse 72  Temp(Src) 98.3 F (36.8 C) (Oral)  Wt 224 lb 4 oz (101.719 kg)  SpO2 95%  Wt Readings from Last 3 Encounters:  08/11/14 224 lb 4 oz (101.719 kg)  05/27/14 220 lb 12 oz (100.132 kg)  02/15/14 220 lb (99.791 kg)    Physical Exam  Constitutional: He appears well-developed and well-nourished. No distress.  HENT:  Head: Normocephalic and atraumatic.  Right Ear: Hearing, tympanic membrane, external ear and ear canal normal.  Left Ear: Hearing, tympanic membrane, external ear and ear canal  normal.  Nose: Nose normal. No mucosal edema or rhinorrhea. Right sinus exhibits no maxillary sinus tenderness and no frontal sinus tenderness. Left sinus exhibits no maxillary sinus tenderness and no frontal sinus tenderness.  Mouth/Throat: Uvula is midline, oropharynx is clear and moist and mucous membranes are normal. No oropharyngeal exudate, posterior oropharyngeal edema, posterior oropharyngeal erythema or tonsillar abscesses.  Eyes: Conjunctivae and EOM are normal. Pupils are equal, round, and reactive to light. No scleral icterus.  Neck: Normal range of motion. Neck supple.  Cardiovascular: Normal rate, regular rhythm, normal heart sounds and intact distal pulses.   No murmur heard. Pulmonary/Chest: Effort normal. No respiratory distress. He has no decreased breath sounds. He has wheezes (faint exp wheeze RLL, coarse wheezing LLL). He has no rhonchi. He has no rales.  Lymphadenopathy:    He has no cervical adenopathy.  Skin: Skin is warm and dry. No rash noted.  Nursing note and vitals reviewed.      Assessment & Plan:   Problem List Items Addressed This Visit    Acute bronchitis with bronchospasm - Primary    Treat with zpack, cheratussin cough syrup for night time, and albuterol inhaler prn. Update if not improving with treatment.          Follow up plan: Return if symptoms worsen or fail to improve.

## 2014-08-11 NOTE — Assessment & Plan Note (Signed)
Treat with zpack, cheratussin cough syrup for night time, and albuterol inhaler prn. Update if not improving with treatment.

## 2014-08-11 NOTE — Telephone Encounter (Signed)
Heidi with [facility] left v/m requesting cb for clarification of quantity and instructions for robitussin AC # 160 ml with instructions 1 ml at hs prn.Please advise.

## 2014-08-11 NOTE — Telephone Encounter (Signed)
[facility]. Clarified 5cc per dose.

## 2014-08-16 ENCOUNTER — Telehealth: Payer: Self-pay | Admitting: Family Medicine

## 2014-08-16 NOTE — Telephone Encounter (Signed)
Patient notified and will try OTC meds and monitor if he runs temp. Will call Wednesday if no improvement.

## 2014-08-16 NOTE — Telephone Encounter (Signed)
Spoke with patient. He said he isn't any worse, but isn't any better. He still has the cough and the cough med is actually keeping him awake. He has started to cough up greenish sputum and his head is still congested. I advised the z-pak is still in his system but that I would check to see if there was anything you wanted to do.

## 2014-08-16 NOTE — Telephone Encounter (Signed)
Pt calls in to notify that he is not seeing any improvement after taking zpack. Pt would like to speak to [facility] regarding this.

## 2014-08-16 NOTE — Telephone Encounter (Signed)
Let's stop cheratussin then. Would offer tessalon perls (100mg  TID PRN cough #30 RF0) prescription or just have him use an OTC robitussin or delsym for night time. To call us on Wed if no improvement for different abx course. Sooner if new fever.

## 2014-08-18 ENCOUNTER — Telehealth: Payer: Self-pay | Admitting: Family Medicine

## 2014-08-18 ENCOUNTER — Other Ambulatory Visit: Payer: Self-pay | Admitting: Family Medicine

## 2014-08-18 MED ORDER — AMOXICILLIN-POT CLAVULANATE 875-125 MG PO TABS: 1.0000 | ORAL_TABLET | Freq: Two times a day (BID) | ORAL | Status: AC

## 2014-08-18 NOTE — Telephone Encounter (Signed)
Rx called in as directed and message left notifying patient. 

## 2014-08-18 NOTE — Telephone Encounter (Addendum)
plz notify augmentin course sent to pharmacy.  Take with yogurt.

## 2014-08-18 NOTE — Telephone Encounter (Signed)
Spoke with patient. He still has the cough and isn't feeling any better. He requests another abx.

## 2014-08-18 NOTE — Telephone Encounter (Signed)
plz phone in. E prescribing error.

## 2014-08-18 NOTE — Telephone Encounter (Signed)
Patient said he's returning your call from 2 days ago.

## 2014-08-18 NOTE — Addendum Note (Signed)
Addended by: Ria Bush on: 08/18/2014 12:50 PM   Modules accepted: Orders

## 2014-08-27 ENCOUNTER — Telehealth: Payer: Self-pay | Admitting: Family Medicine

## 2014-08-27 MED ORDER — TADALAFIL 5 MG PO TABS: 5.0000 mg | ORAL_TABLET | Freq: Every day | ORAL | Status: DC | PRN

## 2014-08-27 NOTE — Telephone Encounter (Signed)
Patient is asking for you to return his call.  He said he spoke to you a couple days ago.

## 2014-08-27 NOTE — Addendum Note (Signed)
Addended by: Royann Shivers A on: 08/27/2014 05:26 PM   Modules accepted: Orders

## 2014-08-27 NOTE — Telephone Encounter (Signed)
Spoke with patient.

## 2014-10-04 ENCOUNTER — Ambulatory Visit (INDEPENDENT_AMBULATORY_CARE_PROVIDER_SITE_OTHER): Payer: BLUE CROSS/BLUE SHIELD | Admitting: Family Medicine

## 2014-10-04 ENCOUNTER — Encounter: Payer: Self-pay | Admitting: Family Medicine

## 2014-10-04 ENCOUNTER — Ambulatory Visit (INDEPENDENT_AMBULATORY_CARE_PROVIDER_SITE_OTHER)
Admission: RE | Admit: 2014-10-04 | Discharge: 2014-10-04 | Disposition: A | Discharge status: Home / Self Care | Payer: BLUE CROSS/BLUE SHIELD | Source: Ambulatory Visit | Attending: Family Medicine | Admitting: Family Medicine

## 2014-10-04 VITALS — BP 118/78 | HR 64 | Temp 97.9°F | Wt 219.0 lb

## 2014-10-04 DIAGNOSIS — R05 Cough: Secondary | ICD-10-CM

## 2014-10-04 DIAGNOSIS — R053 Chronic cough: Secondary | ICD-10-CM

## 2014-10-04 DIAGNOSIS — F528 Other sexual dysfunction not due to a substance or known physiological condition: Secondary | ICD-10-CM

## 2014-10-04 MED ORDER — SILDENAFIL CITRATE 20 MG PO TABS: 60.0000 mg | ORAL_TABLET | Freq: Three times a day (TID) | ORAL | Status: DC

## 2014-10-04 NOTE — Progress Notes (Signed)
Pre visit review using our clinic review tool, if applicable. No additional management support is needed unless otherwise documented below in the visit note. 

## 2014-10-04 NOTE — Progress Notes (Signed)
BP 118/78 mmHg  Pulse 64  Temp(Src) 97.9 F (36.6 C) (Oral)  Wt 219 lb (99.338 kg)  SpO2 96%   CC: cough  Subjective:    Patient ID: Mark Martinez, male    DOB: May 26, 1951, 64 y.o.   MRN: [id number]  HPI: Mark Martinez is a 64 y.o. male presenting on 10/04/2014 for Cough   Seen 08/11/2014 with bronchitis, treated with zpack, cheratussin and albuterol inhaler. Did not improve so then treated with augmentin course but unable to tolerate 2/2 GI upset.   Cough persistent since then, worse first in am and at night time. At times limits sleep. Occasionally dry, occasionally sputum productive. ST (possibly pollen related). Intermittent coughing fits since then.   No fever/chills, PNDrainage, headaches.   cheratussin cough syrup helpful. mucinex did not help at all. Albuterol inhaler not helpful.   Never smoker.   GERD with HH - controlled on nexium daily. No h/o allergic rhinitis - no current sxs either.  Relevant past medical, surgical, family and social history reviewed and updated as indicated. Interim medical history since our last visit reviewed. Allergies and medications reviewed and updated. Current Outpatient Prescriptions on File Prior to Visit  Medication Sig  . acetaminophen (TYLENOL) 325 MG tablet Take 650 mg by mouth every 6 (six) hours as needed for moderate pain.   Marland Kitchen esomeprazole (NEXIUM) 40 MG capsule Take 1 capsule (40 mg total) by mouth daily at 12 noon.  . tadalafil (CIALIS) 5 MG tablet Take 1 tablet (5 mg total) by mouth daily as needed.   No current facility-administered medications on file prior to visit.    Review of Systems Per HPI unless specifically indicated above     Objective:    BP 118/78 mmHg  Pulse 64  Temp(Src) 97.9 F (36.6 C) (Oral)  Wt 219 lb (99.338 kg)  SpO2 96%  Wt Readings from Last 3 Encounters:  10/04/14 219 lb (99.338 kg)  08/11/14 224 lb 4 oz (101.719 kg)  05/27/14 220 lb 12 oz (100.132 kg)    Physical Exam    Constitutional: He appears well-developed and well-nourished. No distress.  HENT:  Head: Normocephalic and atraumatic.  Right Ear: Hearing, tympanic membrane, external ear and ear canal normal.  Left Ear: Hearing, tympanic membrane, external ear and ear canal normal.  Nose: Nose normal. No mucosal edema or rhinorrhea. Right sinus exhibits no maxillary sinus tenderness and no frontal sinus tenderness. Left sinus exhibits no maxillary sinus tenderness and no frontal sinus tenderness.  Mouth/Throat: Uvula is midline, oropharynx is clear and moist and mucous membranes are normal. No oropharyngeal exudate, posterior oropharyngeal edema, posterior oropharyngeal erythema or tonsillar abscesses.  Eyes: Conjunctivae and EOM are normal. Pupils are equal, round, and reactive to light. No scleral icterus.  Neck: Normal range of motion. Neck supple. No thyromegaly present.  Cardiovascular: Normal rate, regular rhythm, normal heart sounds and intact distal pulses.   No murmur heard. Pulmonary/Chest: Effort normal and breath sounds normal. No respiratory distress. He has no wheezes. He has no rales.  Lungs clear  Lymphadenopathy:    He has no cervical adenopathy.  Skin: Skin is warm and dry. No rash noted.  Nursing note and vitals reviewed.     Assessment & Plan:   Problem List Items Addressed This Visit    Persistent cough - Primary    Recent treatment with zpack then augmentin without significant improvement. Cough of 2 + mo duration. Check CXR today. Not consistent with allergic cough. ?hidden  GERD - continue nexium, add pepcid or zantac nightly.  Add OTC cough syrup. Update with effect over next 2 weeks.      Relevant Orders   DG Chest 2 View   ERECTILE DYSFUNCTION    Requests trial of generic sildenafil          Follow up plan: Return if symptoms worsen or fail to improve.

## 2014-10-04 NOTE — Assessment & Plan Note (Signed)
Recent treatment with zpack then augmentin without significant improvement. Cough of 2 + mo duration. Check CXR today. Not consistent with allergic cough. ?hidden GERD - continue nexium, add pepcid or zantac nightly.  Add OTC cough syrup. Update with effect over next 2 weeks.

## 2014-10-04 NOTE — Patient Instructions (Addendum)
xray today. Add zantac or pepcid at night time for next few weeks. Call me with update. Sildenafil prescription printed out today.

## 2014-10-04 NOTE — Assessment & Plan Note (Signed)
Requests trial of generic sildenafil

## 2015-01-05 ENCOUNTER — Other Ambulatory Visit: Payer: Self-pay | Admitting: Family Medicine

## 2015-01-05 ENCOUNTER — Other Ambulatory Visit: Payer: Self-pay | Admitting: *Deleted

## 2015-01-05 MED ORDER — ESOMEPRAZOLE MAGNESIUM 40 MG PO CPDR: 40.0000 mg | DELAYED_RELEASE_CAPSULE | Freq: Every day | ORAL | Status: DC

## 2015-01-05 NOTE — Telephone Encounter (Signed)
[facility] requesting [facility] 90 day supply.  Insurance will only cover a 90 day supply.

## 2015-01-07 ENCOUNTER — Other Ambulatory Visit: Payer: Self-pay | Admitting: Family Medicine

## 2015-02-21 ENCOUNTER — Other Ambulatory Visit: Payer: Self-pay | Admitting: Family Medicine

## 2015-02-21 DIAGNOSIS — E785 Hyperlipidemia, unspecified: Secondary | ICD-10-CM

## 2015-02-21 DIAGNOSIS — Z125 Encounter for screening for malignant neoplasm of prostate: Secondary | ICD-10-CM

## 2015-02-25 ENCOUNTER — Other Ambulatory Visit (INDEPENDENT_AMBULATORY_CARE_PROVIDER_SITE_OTHER): Payer: BLUE CROSS/BLUE SHIELD

## 2015-02-25 DIAGNOSIS — E785 Hyperlipidemia, unspecified: Secondary | ICD-10-CM | POA: Diagnosis not present

## 2015-02-25 DIAGNOSIS — Z125 Encounter for screening for malignant neoplasm of prostate: Secondary | ICD-10-CM | POA: Diagnosis not present

## 2015-02-25 LAB — BASIC METABOLIC PANEL
BUN: 20 mg/dL (ref 6–23)
CO2: 30 mEq/L (ref 19–32)
CREATININE: 1.14 mg/dL (ref 0.40–1.50)
Calcium: 9.2 mg/dL (ref 8.4–10.5)
Chloride: 101 mEq/L (ref 96–112)
GFR: 68.99 mL/min (ref >60.00)
Glucose, Bld: 93 mg/dL (ref 70–99)
Potassium: 5 mEq/L (ref 3.5–5.1)
Sodium: 138 mEq/L (ref 135–145)

## 2015-02-25 LAB — PSA: PSA: 0.26 ng/mL (ref 0.10–4.00)

## 2015-02-25 LAB — LIPID PANEL
CHOL/HDL RATIO: 4
Cholesterol: 193 mg/dL (ref 0–200)
HDL: 46.7 mg/dL (ref >39.00)
LDL Cholesterol: 113 mg/dL — ABNORMAL HIGH (ref 0–99)
NONHDL: 146.06
Triglycerides: 166 mg/dL — ABNORMAL HIGH (ref 0.0–149.0)
VLDL: 33.2 mg/dL (ref 0.0–40.0)

## 2015-03-08 ENCOUNTER — Encounter: Payer: Self-pay | Admitting: Family Medicine

## 2015-03-08 ENCOUNTER — Ambulatory Visit (INDEPENDENT_AMBULATORY_CARE_PROVIDER_SITE_OTHER): Payer: BLUE CROSS/BLUE SHIELD | Admitting: Family Medicine

## 2015-03-08 VITALS — BP 124/82 | HR 68 | Temp 97.9°F | Ht 73.0 in | Wt 221.8 lb

## 2015-03-08 DIAGNOSIS — Z0000 Encounter for general adult medical examination without abnormal findings: Secondary | ICD-10-CM | POA: Diagnosis not present

## 2015-03-08 DIAGNOSIS — K219 Gastro-esophageal reflux disease without esophagitis: Secondary | ICD-10-CM

## 2015-03-08 DIAGNOSIS — L720 Epidermal cyst: Secondary | ICD-10-CM

## 2015-03-08 DIAGNOSIS — Z23 Encounter for immunization: Secondary | ICD-10-CM | POA: Diagnosis not present

## 2015-03-08 DIAGNOSIS — E785 Hyperlipidemia, unspecified: Secondary | ICD-10-CM

## 2015-03-08 DIAGNOSIS — F528 Other sexual dysfunction not due to a substance or known physiological condition: Secondary | ICD-10-CM

## 2015-03-08 DIAGNOSIS — Z1211 Encounter for screening for malignant neoplasm of colon: Secondary | ICD-10-CM

## 2015-03-08 NOTE — Progress Notes (Signed)
Pre visit review using our clinic review tool, if applicable. No additional management support is needed unless otherwise documented below in the visit note. 

## 2015-03-08 NOTE — Patient Instructions (Addendum)
Pass by lab to pick up a stool kit.  Call your insurance about the shingles shot to see if it is covered or how much it would cost and where is cheaper (here or pharmacy).  If you want to receive here, call for nurse visit. Nice to see you today, call us with questions.  Health Maintenance A healthy lifestyle and preventative care can promote health and wellness.  Maintain regular health, dental, and eye exams.  Eat a healthy diet. Foods like vegetables, fruits, whole grains, low-fat dairy products, and lean protein foods contain the nutrients you need and are low in calories. Decrease your intake of foods high in solid fats, added sugars, and salt. Get information about a proper diet from your health care provider, if necessary.  Regular physical exercise is one of the most important things you can do for your health. Most adults should get at least 150 minutes of moderate-intensity exercise (any activity that increases your heart rate and causes you to sweat) each week. In addition, most adults need muscle-strengthening exercises on 2 or more days a week.   Maintain a healthy weight. The body mass index (BMI) is a screening tool to identify possible weight problems. It provides an estimate of body fat based on height and weight. Your health care provider can find your BMI and can help you achieve or maintain a healthy weight. For males 20 years and older:  A BMI below 18.5 is considered underweight.  A BMI of 18.5 to 24.9 is normal.  A BMI of 25 to 29.9 is considered overweight.  A BMI of 30 and above is considered obese.  Maintain normal blood lipids and cholesterol by exercising and minimizing your intake of saturated fat. Eat a balanced diet with plenty of fruits and vegetables. Blood tests for lipids and cholesterol should begin at age 15 and be repeated every 5 years. If your lipid or cholesterol levels are high, you are over age 39, or you are at high risk for heart disease, you may  need your cholesterol levels checked more frequently.Ongoing high lipid and cholesterol levels should be treated with medicines if diet and exercise are not working.  If you smoke, find out from your health care provider how to quit. If you do not use tobacco, do not start.  Lung cancer screening is recommended for adults aged 19-80 years who are at high risk for developing lung cancer because of a history of smoking. A yearly low-dose CT scan of the lungs is recommended for people who have at least a 30-pack-year history of smoking and are current smokers or have quit within the past 15 years. A pack year of smoking is smoking an average of 1 pack of cigarettes a day for 1 year (for example, a 30-pack-year history of smoking could mean smoking 1 pack a day for 30 years or 2 packs a day for 15 years). Yearly screening should continue until the smoker has stopped smoking for at least 15 years. Yearly screening should be stopped for people who develop a health problem that would prevent them from having lung cancer treatment.  If you choose to drink alcohol, do not have more than 2 drinks per day. One drink is considered to be 12 oz (360 mL) of beer, 5 oz (150 mL) of wine, or 1.5 oz (45 mL) of liquor.  Avoid the use of street drugs. Do not share needles with anyone. Ask for help if you need support or instructions about stopping  the use of drugs.  High blood pressure causes heart disease and increases the risk of stroke. Blood pressure should be checked at least every 1-2 years. Ongoing high blood pressure should be treated with medicines if weight loss and exercise are not effective.  If you are 76-48 years old, ask your health care provider if you should take aspirin to prevent heart disease.  Diabetes screening involves taking a blood sample to check your fasting blood sugar level. This should be done once every 3 years after age 71 if you are at a normal weight and without risk factors for diabetes.  Testing should be considered at a younger age or be carried out more frequently if you are overweight and have at least 1 risk factor for diabetes.  Colorectal cancer can be detected and often prevented. Most routine colorectal cancer screening begins at the age of 60 and continues through age 64. However, your health care provider may recommend screening at an earlier age if you have risk factors for colon cancer. On a yearly basis, your health care provider may provide home test kits to check for hidden blood in the stool. A small camera at the end of a tube may be used to directly examine the colon (sigmoidoscopy or colonoscopy) to detect the earliest forms of colorectal cancer. Talk to your health care provider about this at age 61 when routine screening begins. A direct exam of the colon should be repeated every 5-10 years through age 39, unless early forms of precancerous polyps or small growths are found.  People who are at an increased risk for hepatitis B should be screened for this virus. You are considered at high risk for hepatitis B if:  You were born in a country where hepatitis B occurs often. Talk with your health care provider about which countries are considered high risk.  Your parents were born in a high-risk country and you have not received a shot to protect against hepatitis B (hepatitis B vaccine).  You have HIV or AIDS.  You use needles to inject street drugs.  You live with, or have sex with, someone who has hepatitis B.  You are a man who has sex with other men (MSM).  You get hemodialysis treatment.  You take certain medicines for conditions like cancer, organ transplantation, and autoimmune conditions.  Hepatitis C blood testing is recommended for all people born from [birth year] through 1964 and any individual with known risk factors for hepatitis C.  Healthy men should no longer receive prostate-specific antigen (PSA) blood tests as part of routine cancer screening.  Talk to your health care provider about prostate cancer screening.  Testicular cancer screening is not recommended for adolescents or adult males who have no symptoms. Screening includes self-exam, a health care provider exam, and other screening tests. Consult with your health care provider about any symptoms you have or any concerns you have about testicular cancer.  Practice safe sex. Use condoms and avoid high-risk sexual practices to reduce the spread of sexually transmitted infections (STIs).  You should be screened for STIs, including gonorrhea and chlamydia if:  You are sexually active and are younger than 24 years.  You are older than 24 years, and your health care provider tells you that you are at risk for this type of infection.  Your sexual activity has changed since you were last screened, and you are at an increased risk for chlamydia or gonorrhea. Ask your health care provider if you are at  risk.  If you are at risk of being infected with HIV, it is recommended that you take a prescription medicine daily to prevent HIV infection. This is called pre-exposure prophylaxis (PrEP). You are considered at risk if:  You are a man who has sex with other men (MSM).  You are a heterosexual man who is sexually active with multiple partners.  You take drugs by injection.  You are sexually active with a partner who has HIV.  Talk with your health care provider about whether you are at high risk of being infected with HIV. If you choose to begin PrEP, you should first be tested for HIV. You should then be tested every 3 months for as long as you are taking PrEP.  Use sunscreen. Apply sunscreen liberally and repeatedly throughout the day. You should seek shade when your shadow is shorter than you. Protect yourself by wearing long sleeves, pants, a wide-brimmed hat, and sunglasses year round whenever you are outdoors.  Tell your health care provider of new moles or changes in moles,  especially if there is a change in shape or color. Also, tell your health care provider if a mole is larger than the size of a pencil eraser.  A one-time screening for abdominal aortic aneurysm (AAA) and surgical repair of large AAAs by ultrasound is recommended for men aged 71-75 years who are current or former smokers.  Stay current with your vaccines (immunizations). Document Released: 12/01/2007 Document Revised: 06/09/2013 Document Reviewed: 10/30/2010 [facility] Patient Information 2015 [place], [place]. This information is not intended to replace advice given to you by your health care provider. Make sure you discuss any questions you have with your health care provider.

## 2015-03-08 NOTE — Assessment & Plan Note (Signed)
Chronic, stable on nexium. Continue.

## 2015-03-08 NOTE — Assessment & Plan Note (Addendum)
Continue generic sildenafil. Takes 40mg  at a time.

## 2015-03-08 NOTE — Addendum Note (Signed)
Addended by: Royann Shivers A on: 03/08/2015 09:05 AM   Modules accepted: Orders

## 2015-03-08 NOTE — Assessment & Plan Note (Addendum)
Prior cyst s/p L neck excision by surgery last year.  New cyst L upper back present for 1+ yr. Pt will consider if desires removal and return for excision if needed. Wife wants it removed.

## 2015-03-08 NOTE — Assessment & Plan Note (Addendum)
Reviewed #s. Improvement noted, pt attributes to increased activity and decreased ice cream.

## 2015-03-08 NOTE — Progress Notes (Signed)
BP 124/82 mmHg  Pulse 68  Temp(Src) 97.9 F (36.6 C) (Oral)  Ht '6\' 1"'  (1.854 m)  Wt 221 lb 12 oz (100.585 kg)  BMI 29.26 kg/m2   CC: CPE  Subjective:    Patient ID: Mark Martinez, male    DOB: 1950/09/07, 64 y.o.   MRN: [id number]  HPI: Mark Martinez is a 64 y.o. male presenting on 03/08/2015 for Annual Exam   Preventative: Colonoscopy - 2005, WNL rec rpt 10 years. Stool kit normal 2 yrs ago, did not return last year's kit.  Prostate - Given all normal checks in the past, requests Q92yrscreening starting. Check today. Flu shot today Tdap 2014.  zostavax - discussed. Will check with insurance. Sunscreen use discussed. No changing moles on skin. Seatbelt use discussed.   Caffeine: rare  Lives with wife, 1 dog.  Grown children  Occupation: Works in lBiomedical scientist  Activity: stays active at work  Diet: daily fruits/vegetables, some water   Relevant past medical, surgical, family and social history reviewed and updated as indicated. Interim medical history since our last visit reviewed. Allergies and medications reviewed and updated. Current Outpatient Prescriptions on File Prior to Visit  Medication Sig  . acetaminophen (TYLENOL) 325 MG tablet Take 650 mg by mouth every 6 (six) hours as needed for moderate pain.   .Marland Kitchenesomeprazole (NEXIUM) 40 MG capsule Take 1 capsule (40 mg total) by mouth daily.   No current facility-administered medications on file prior to visit.    Review of Systems  Constitutional: Negative for fever, chills, activity change, appetite change, fatigue and unexpected weight change.  HENT: Negative for hearing loss.   Eyes: Negative for visual disturbance.  Respiratory: Negative for cough, chest tightness, shortness of breath and wheezing.   Cardiovascular: Negative for chest pain, palpitations and leg swelling.  Gastrointestinal: Negative for nausea, vomiting, abdominal pain, diarrhea, constipation, blood in stool and abdominal distention.    Genitourinary: Negative for hematuria and difficulty urinating.  Musculoskeletal: Negative for myalgias, arthralgias and neck pain.  Skin: Negative for rash.  Neurological: Negative for dizziness, seizures, syncope and headaches.  Hematological: Negative for adenopathy. Does not bruise/bleed easily.  Psychiatric/Behavioral: Negative for dysphoric mood. The patient is not nervous/anxious.    Per HPI unless specifically indicated above     Objective:    BP 124/82 mmHg  Pulse 68  Temp(Src) 97.9 F (36.6 C) (Oral)  Ht '6\' 1"'  (1.854 m)  Wt 221 lb 12 oz (100.585 kg)  BMI 29.26 kg/m2  Wt Readings from Last 3 Encounters:  03/08/15 221 lb 12 oz (100.585 kg)  10/04/14 219 lb (99.338 kg)  08/11/14 224 lb 4 oz (101.719 kg)    Physical Exam  Constitutional: He is oriented to person, place, and time. He appears well-developed and well-nourished. No distress.  HENT:  Head: Normocephalic and atraumatic.  Right Ear: Hearing, tympanic membrane, external ear and ear canal normal.  Left Ear: Hearing, tympanic membrane, external ear and ear canal normal.  Nose: Nose normal.  Mouth/Throat: Uvula is midline, oropharynx is clear and moist and mucous membranes are normal. No oropharyngeal exudate, posterior oropharyngeal edema or posterior oropharyngeal erythema.  Eyes: Conjunctivae and EOM are normal. Pupils are equal, round, and reactive to light. No scleral icterus.  Neck: Normal range of motion. Neck supple. Carotid bruit is not present. No thyromegaly present.  Cardiovascular: Normal rate, regular rhythm, normal heart sounds and intact distal pulses.   No murmur heard. Pulses:      Radial  pulses are 2+ on the right side, and 2+ on the left side.  Pulmonary/Chest: Effort normal and breath sounds normal. No respiratory distress. He has no wheezes. He has no rales.  Abdominal: Soft. Bowel sounds are normal. He exhibits no distension and no mass. There is no tenderness. There is no rebound and no  guarding.  Genitourinary: Rectum normal and prostate normal. Rectal exam shows no external hemorrhoid, no internal hemorrhoid, no fissure, no mass, no tenderness and anal tone normal. Prostate is not enlarged (20gm) and not tender.  Musculoskeletal: Normal range of motion. He exhibits no edema.  Lymphadenopathy:    He has no cervical adenopathy.  Neurological: He is alert and oriented to person, place, and time.  CN grossly intact, station and gait intact  Skin: Skin is warm and dry. No rash noted.  Psychiatric: He has a normal mood and affect. His behavior is normal. Judgment and thought content normal.  Nursing note and vitals reviewed.  Results for orders placed or performed in visit on 02/25/15  PSA  Result Value Ref Range   PSA 0.26 0.10 - 4.00 ng/mL  Lipid panel  Result Value Ref Range   Cholesterol 193 0 - 200 mg/dL   Triglycerides 166.0 (H) 0.0 - 149.0 mg/dL   HDL 46.70 >39.00 mg/dL   VLDL 33.2 0.0 - 40.0 mg/dL   LDL Cholesterol 113 (H) 0 - 99 mg/dL   Total CHOL/HDL Ratio 4    NonHDL 638.17   Basic metabolic panel  Result Value Ref Range   Sodium 138 135 - 145 mEq/L   Potassium 5.0 3.5 - 5.1 mEq/L   Chloride 101 96 - 112 mEq/L   CO2 30 19 - 32 mEq/L   Glucose, Bld 93 70 - 99 mg/dL   BUN 20 6 - 23 mg/dL   Creatinine, Ser 1.14 0.40 - 1.50 mg/dL   Calcium 9.2 8.4 - 10.5 mg/dL   GFR 68.99 >60.00 mL/min      Assessment & Plan:  Hep C screen next labwork  Problem List Items Addressed This Visit    ERECTILE DYSFUNCTION    Continue generic sildenafil. Takes 28m at a time.      GASTROESOPHAGEAL REFLUX DISEASE    Chronic, stable on nexium. Continue.      Healthcare maintenance - Primary    Preventative protocols reviewed and updated unless pt declined. Discussed healthy diet and lifestyle.       HLD (hyperlipidemia)    Reviewed #s. Improvement noted, pt attributes to increased activity and decreased ice cream.      Relevant Medications   sildenafil (REVATIO)  20 MG tablet   Epidermal inclusion cyst    Prior cyst s/p L neck excision by surgery last year.  New cyst L upper back present for 1+ yr. Pt will consider if desires removal and return for excision if needed. Wife wants it removed.       Other Visit Diagnoses    Special screening for malignant neoplasms, colon        Relevant Orders    Fecal occult blood, imunochemical        Follow up plan: Return in about 1 year (around 03/07/2016), or as needed, for annual exam, prior fasting for blood work.

## 2015-03-08 NOTE — Assessment & Plan Note (Signed)
Preventative protocols reviewed and updated unless pt declined. Discussed healthy diet and lifestyle.  

## 2015-04-06 ENCOUNTER — Other Ambulatory Visit: Payer: Self-pay | Admitting: Family Medicine

## 2015-06-23 ENCOUNTER — Encounter: Payer: Self-pay | Admitting: Family Medicine

## 2015-06-23 ENCOUNTER — Ambulatory Visit (INDEPENDENT_AMBULATORY_CARE_PROVIDER_SITE_OTHER): Payer: BLUE CROSS/BLUE SHIELD | Admitting: Family Medicine

## 2015-06-23 VITALS — BP 122/82 | HR 90 | Temp 98.3°F | Ht 73.0 in | Wt 220.0 lb

## 2015-06-23 DIAGNOSIS — B9689 Other specified bacterial agents as the cause of diseases classified elsewhere: Secondary | ICD-10-CM | POA: Insufficient documentation

## 2015-06-23 DIAGNOSIS — J019 Acute sinusitis, unspecified: Secondary | ICD-10-CM | POA: Diagnosis not present

## 2015-06-23 MED ORDER — DOXYCYCLINE HYCLATE 100 MG PO TABS: 100.0000 mg | ORAL_TABLET | Freq: Two times a day (BID) | ORAL | Status: DC

## 2015-06-23 NOTE — Assessment & Plan Note (Signed)
Given duration of sxs anticipate bacterial sinusitis - treat with doxy antibiotic. Supportive care as per instructions. Update if not improved with treatment.

## 2015-06-23 NOTE — Progress Notes (Signed)
BP 122/82 mmHg  Pulse 90  Temp(Src) 98.3 F (36.8 C) (Oral)  Ht 6\' 1"  (1.854 m)  Wt 220 lb (99.791 kg)  BMI 29.03 kg/m2  SpO2 97%   CC: nasal congestion with facial pain  Subjective:    Patient ID: Mark Martinez, male    DOB: 12/25/1950, 65 y.o.   MRN: [id number]  HPI: Mark Martinez is a 65 y.o. male presenting on 06/23/2015 for Nasal Congestion   10+d h/o headache, left facial pain, nasal congestion. Cough leads to pain in left temple. Head > chest congestion. Mild L tooth pain.   No fevers, ear pain, vision changes, ST or PNDrainage.   Taking mucinex and tylenol and cough syrup. No sick contacts at home. No smokers at home.  No h/o asthma.   Relevant past medical, surgical, family and social history reviewed and updated as indicated. Interim medical history since our last visit reviewed. Allergies and medications reviewed and updated. Current Outpatient Prescriptions on File Prior to Visit  Medication Sig  . acetaminophen (TYLENOL) 325 MG tablet Take 650 mg by mouth every 6 (six) hours as needed for moderate pain.   Marland Kitchen esomeprazole (NEXIUM) 40 MG capsule TAKE 1 CAPSULE (40 MG TOTAL) BY MOUTH DAILY.  . sildenafil (REVATIO) 20 MG tablet Take 2 tablets (40 mg total) by mouth as needed.   No current facility-administered medications on file prior to visit.    Review of Systems Per HPI unless specifically indicated in ROS section     Objective:    BP 122/82 mmHg  Pulse 90  Temp(Src) 98.3 F (36.8 C) (Oral)  Ht 6\' 1"  (1.854 m)  Wt 220 lb (99.791 kg)  BMI 29.03 kg/m2  SpO2 97%  Wt Readings from Last 3 Encounters:  06/23/15 220 lb (99.791 kg)  03/08/15 221 lb 12 oz (100.585 kg)  10/04/14 219 lb (99.338 kg)    Physical Exam  Constitutional: He appears well-developed and well-nourished. No distress.  HENT:  Head: Normocephalic and atraumatic.  Right Ear: Hearing, tympanic membrane, external ear and ear canal normal.  Left Ear: Hearing, tympanic membrane,  external ear and ear canal normal.  Nose: Mucosal edema (pale nasal mucosa) present. No rhinorrhea. Right sinus exhibits no maxillary sinus tenderness and no frontal sinus tenderness. Left sinus exhibits no maxillary sinus tenderness and no frontal sinus tenderness.  Mouth/Throat: Uvula is midline, oropharynx is clear and moist and mucous membranes are normal. No oropharyngeal exudate, posterior oropharyngeal edema, posterior oropharyngeal erythema or tonsillar abscesses.  Eyes: Conjunctivae and EOM are normal. Pupils are equal, round, and reactive to light. No scleral icterus.  Neck: Normal range of motion. Neck supple.  Cardiovascular: Normal rate, regular rhythm, normal heart sounds and intact distal pulses.   No murmur heard. Pulmonary/Chest: Effort normal and breath sounds normal. No respiratory distress. He has no wheezes. He has no rales.  Lymphadenopathy:    He has no cervical adenopathy.  Skin: Skin is warm and dry. No rash noted.  Nursing note and vitals reviewed.     Assessment & Plan:   Problem List Items Addressed This Visit    Acute bacterial sinusitis - Primary    Given duration of sxs anticipate bacterial sinusitis - treat with doxy antibiotic. Supportive care as per instructions. Update if not improved with treatment.      Relevant Medications   doxycycline (VIBRA-TABS) 100 MG tablet       Follow up plan: Return if symptoms worsen or fail to improve.

## 2015-06-23 NOTE — Patient Instructions (Signed)
You have a sinus infection. Take medicine as prescribed: doxycycline 10 d course. Push fluids and plenty of rest. Nasal saline irrigation or neti pot to help drain sinuses. Try limited amount of ibuprofen with food as well. May continue plain mucinex with plenty of fluid to help mobilize mucous. Please let us know if fever >101.5, trouble opening/closing mouth, difficulty swallowing, or worsening instead of improving as expected.

## 2015-07-28 ENCOUNTER — Encounter: Payer: Self-pay | Admitting: Family Medicine

## 2015-07-28 ENCOUNTER — Ambulatory Visit (INDEPENDENT_AMBULATORY_CARE_PROVIDER_SITE_OTHER): Payer: BLUE CROSS/BLUE SHIELD | Admitting: Family Medicine

## 2015-07-28 VITALS — BP 118/78 | HR 66 | Temp 98.1°F | Wt 217.8 lb

## 2015-07-28 DIAGNOSIS — R7303 Prediabetes: Secondary | ICD-10-CM | POA: Diagnosis not present

## 2015-07-28 DIAGNOSIS — M25529 Pain in unspecified elbow: Secondary | ICD-10-CM | POA: Diagnosis not present

## 2015-07-28 DIAGNOSIS — M26609 Unspecified temporomandibular joint disorder, unspecified side: Secondary | ICD-10-CM

## 2015-07-28 DIAGNOSIS — M19029 Primary osteoarthritis, unspecified elbow: Secondary | ICD-10-CM | POA: Insufficient documentation

## 2015-07-28 HISTORY — DX: Prediabetes: R73.03

## 2015-07-28 LAB — HEMOGLOBIN A1C: HEMOGLOBIN A1C: 6.2 % (ref 4.6–6.5)

## 2015-07-28 NOTE — Patient Instructions (Addendum)
Check A1c today For elbow - possible arthritis of joints or tendonitis. Treat with warm compresses and tylenol or aleve as needed (take aleve or ibuporfen with food if needed). For jaw - possible TMJ dislocation on left or just dysfunction. Treat with exercises discussed today. If no improvement, return to see dentist.   Temporomandibular Joint Syndrome Temporomandibular joint (TMJ) syndrome is a condition that affects the joints between your jaw and your skull. The TMJs are located near your ears and allow your jaw to open and close. These joints and the nearby muscles are involved in all movements of the jaw. People with TMJ syndrome have pain in the area of these joints and muscles. Chewing, biting, or other movements of the jaw can be difficult or painful. TMJ syndrome can be caused by various things. In many cases, the condition is mild and goes away within a few weeks. For some people, the condition can become a long-term problem. CAUSES Possible causes of TMJ syndrome include:  Grinding your teeth or clenching your jaw. Some people do this when they are under stress.  Arthritis.  Injury to the jaw.  Head or neck injury.  Teeth or dentures that are not aligned well. In some cases, the cause of TMJ syndrome may not be known. SIGNS AND SYMPTOMS The most common symptom is an aching pain on the side of the head in the area of the TMJ. Other symptoms may include:  Pain when moving your jaw, such as when chewing or biting.  Being unable to open your jaw all the way.  Making a clicking sound when you open your mouth.  Headache.  Earache.  Neck or shoulder pain. DIAGNOSIS Diagnosis can usually be made based on your symptoms, your medical history, and a physical exam. Your health care provider may check the range of motion of your jaw. Imaging tests, such as X-rays or an MRI, are sometimes done. You may need to see your dentist to determine if your teeth and jaw are lined up  correctly. TREATMENT TMJ syndrome often goes away on its own. If treatment is needed, the options may include:  Eating soft foods and applying ice or heat.  Medicines to relieve pain or inflammation.  Medicines to relax the muscles.  A splint, bite plate, or mouthpiece to prevent teeth grinding or jaw clenching.  Relaxation techniques or counseling to help reduce stress.  Transcutaneous electrical nerve stimulation (TENS). This helps to relieve pain by applying an electrical current through the skin.  Acupuncture. This is sometimes helpful to relieve pain.  Jaw surgery. This is rarely needed. HOME CARE INSTRUCTIONS  Take medicines only as directed by your health care provider.  Eat a soft diet if you are having trouble chewing.  Apply ice to the painful area.  Put ice in a plastic bag.  Place a towel between your skin and the bag.  Leave the ice on for 20 minutes, 2-3 times a day.  Apply a warm compress to the painful area as directed.  Massage your jaw area and perform any jaw stretching exercises as recommended by your health care provider.  If you were given a mouthpiece or bite plate, wear it as directed.  Avoid foods that require a lot of chewing. Do not chew gum.  Keep all follow-up visits as directed by your health care provider. This is important. SEEK MEDICAL CARE IF:  You are having trouble eating.  You have new or worsening symptoms. SEEK IMMEDIATE MEDICAL CARE IF:  Your jaw locks open or closed.   This information is not intended to replace advice given to you by your health care provider. Make sure you discuss any questions you have with your health care provider.   Document Released: 02/27/2001 Document Revised: 06/25/2014 Document Reviewed: 01/07/2014 Elsevier Interactive Patient Education [employer].

## 2015-07-28 NOTE — Assessment & Plan Note (Signed)
New diagnosis based on labwork by life insurance company - will check A1c today. Discussed avoiding added sugars/sweetened beverages in diet

## 2015-07-28 NOTE — Progress Notes (Signed)
BP 118/78 mmHg  Pulse 66  Temp(Src) 98.1 F (36.7 C) (Oral)  Wt 217 lb 12 oz (98.771 kg)  SpO2 97%   CC: discuss jaw and elbow pain  Subjective:    Patient ID: Mark Martinez, male    DOB: 03-08-1951, 65 y.o.   MRN: [id number]  HPI: RAMEIR Mark Martinez is a 65 y.o. male presenting on 07/28/2015 for Elbow Pain and Jaw Pain   Comes in today with L>R elbow pain ongoing for several months, acutely worse last few weeks. 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 Points to lateral elbow. No redness/swelling/warmth. Has tried nothing for this yet. Denies inciting trauma/injury.   Some left jaw stiffness that started last summer suddenly when he awoke one morning. Limited ability to open mouth. Shifting mandible from left to right caused soreness. This has happened three times. No pain with chewing but does intermittently feel popping. Denies inciting trauma or injury. This last episode started 1 mo ago. Last saw dentist 3 mo ago.   Stress echo 2015 ([facility]) - normal stress EKG, possible stress induced ischemia, possible mild hypokinesis of apical anterior and apical LV myocardium.   Recent A1c checked for insurance purposes 6.3%. Requests recheck. Has never really had significantly elevated fasting sugars in the past.  Relevant past medical, surgical, family and social history reviewed and updated as indicated. Interim medical history since our last visit reviewed. Allergies and medications reviewed and updated. Current Outpatient Prescriptions on File Prior to Visit  Medication Sig  . acetaminophen (TYLENOL) 325 MG tablet Take 650 mg by mouth every 6 (six) hours as needed for moderate pain.   Marland Kitchen esomeprazole (NEXIUM) 40 MG capsule TAKE 1 CAPSULE (40 MG TOTAL) BY MOUTH DAILY.  . sildenafil (REVATIO) 20 MG tablet Take 2 tablets (40 mg total) by mouth as needed.   No current facility-administered medications on file prior to visit.    Review of Systems Per HPI unless  specifically indicated in ROS section     Objective:    BP 118/78 mmHg  Pulse 66  Temp(Src) 98.1 F (36.7 C) (Oral)  Wt 217 lb 12 oz (98.771 kg)  SpO2 97%  Wt Readings from Last 3 Encounters:  07/28/15 217 lb 12 oz (98.771 kg)  06/23/15 220 lb (99.791 kg)  03/08/15 221 lb 12 oz (100.585 kg)    Physical Exam  Constitutional: He appears well-developed and well-nourished. No distress.  HENT:  Mouth/Throat: Oropharynx is clear and moist. No oropharyngeal exudate.  Limited movement of L TMJ upon mandible opening No pain or swelling appreciated, no popping appreciated No dental pain R TM and canal WNL No LAD or parotid swelling  Musculoskeletal: He exhibits no edema.  2+ rad bilaterally No pain to palpation bilateral elbow joints. FROM flexion bilateral elbows, slightly limited full extension No pain or swelling at olecranon or epicondyles  Neurological:  Sensation intact 5/5 strength BUE  Skin: Skin is warm and dry. No rash noted.  Nursing note and vitals reviewed.  Results for orders placed or performed in visit on 02/25/15  PSA  Result Value Ref Range   PSA 0.26 0.10 - 4.00 ng/mL  Lipid panel  Result Value Ref Range   Cholesterol 193 0 - 200 mg/dL   Triglycerides 166.0 (H) 0.0 - 149.0 mg/dL   HDL 46.70 >39.00 mg/dL   VLDL 33.2 0.0 - 40.0 mg/dL   LDL Cholesterol 113 (H) 0 - 99 mg/dL   Total CHOL/HDL Ratio 4  NonHDL 0000000   Basic metabolic panel  Result Value Ref Range   Sodium 138 135 - 145 mEq/L   Potassium 5.0 3.5 - 5.1 mEq/L   Chloride 101 96 - 112 mEq/L   CO2 30 19 - 32 mEq/L   Glucose, Bld 93 70 - 99 mg/dL   BUN 20 6 - 23 mg/dL   Creatinine, Ser 1.14 0.40 - 1.50 mg/dL   Calcium 9.2 8.4 - 10.5 mg/dL   GFR 68.99 >60.00 mL/min      Assessment & Plan:   Problem List Items Addressed This Visit    TMJ dysfunction - Primary    Evident decreased ROM on left on exam today, no pain. ?dislocation vs dysfunction vs degenerative arthritis of TMJ. Discussed  eccentric TMJ exercises to try at home. Discussed NSAID/tylenol use. If persistent discomfort, rec f/u with dentist. Pt agrees with plan.      Prediabetes    New diagnosis based on labwork by life insurance company - will check A1c today. Discussed avoiding added sugars/sweetened beverages in diet      Relevant Orders   Hemoglobin A1c   Elbow pain    Anticipate osteoarthritis vs tendonitis but exam benign today without reproducible pain. Discussed NSAID/tylenol use. Update if persistent or worsening sxs.          Follow up plan: Return if symptoms worsen or fail to improve.

## 2015-07-28 NOTE — Assessment & Plan Note (Signed)
Evident decreased ROM on left on exam today, no pain. ?dislocation vs dysfunction vs degenerative arthritis of TMJ. Discussed eccentric TMJ exercises to try at home. Discussed NSAID/tylenol use. If persistent discomfort, rec f/u with dentist. Pt agrees with plan.

## 2015-07-28 NOTE — Assessment & Plan Note (Signed)
Anticipate osteoarthritis vs tendonitis but exam benign today without reproducible pain. Discussed NSAID/tylenol use. Update if persistent or worsening sxs.

## 2015-07-28 NOTE — Progress Notes (Signed)
Pre visit review using our clinic review tool, if applicable. No additional management support is needed unless otherwise documented below in the visit note. 

## 2015-07-30 ENCOUNTER — Encounter: Payer: Self-pay | Admitting: Family Medicine

## 2015-08-01 ENCOUNTER — Encounter: Payer: Self-pay | Admitting: *Deleted

## 2016-01-23 ENCOUNTER — Other Ambulatory Visit: Payer: Self-pay | Admitting: Family Medicine

## 2016-03-22 ENCOUNTER — Other Ambulatory Visit: Payer: Self-pay | Admitting: Family Medicine

## 2016-03-22 DIAGNOSIS — Z1159 Encounter for screening for other viral diseases: Secondary | ICD-10-CM

## 2016-03-22 DIAGNOSIS — E785 Hyperlipidemia, unspecified: Secondary | ICD-10-CM

## 2016-03-22 DIAGNOSIS — Z125 Encounter for screening for malignant neoplasm of prostate: Secondary | ICD-10-CM

## 2016-03-22 DIAGNOSIS — R7303 Prediabetes: Secondary | ICD-10-CM

## 2016-03-23 ENCOUNTER — Other Ambulatory Visit (INDEPENDENT_AMBULATORY_CARE_PROVIDER_SITE_OTHER): Payer: BLUE CROSS/BLUE SHIELD

## 2016-03-23 DIAGNOSIS — E785 Hyperlipidemia, unspecified: Secondary | ICD-10-CM

## 2016-03-23 DIAGNOSIS — Z125 Encounter for screening for malignant neoplasm of prostate: Secondary | ICD-10-CM | POA: Diagnosis not present

## 2016-03-23 DIAGNOSIS — Z1159 Encounter for screening for other viral diseases: Secondary | ICD-10-CM

## 2016-03-23 DIAGNOSIS — R7303 Prediabetes: Secondary | ICD-10-CM

## 2016-03-23 LAB — HEMOGLOBIN A1C: Hgb A1c MFr Bld: 6.1 % (ref 4.6–6.5)

## 2016-03-23 LAB — BASIC METABOLIC PANEL
BUN: 18 mg/dL (ref 6–23)
CHLORIDE: 103 meq/L (ref 96–112)
CO2: 30 mEq/L (ref 19–32)
CREATININE: 1.07 mg/dL (ref 0.40–1.50)
Calcium: 9.3 mg/dL (ref 8.4–10.5)
GFR: 73.97 mL/min (ref >60.00)
GLUCOSE: 100 mg/dL — AB (ref 70–99)
Potassium: 4.9 mEq/L (ref 3.5–5.1)
Sodium: 139 mEq/L (ref 135–145)

## 2016-03-23 LAB — LIPID PANEL
Cholesterol: 184 mg/dL (ref 0–200)
HDL: 46.9 mg/dL (ref >39.00)
LDL CALC: 120 mg/dL — AB (ref 0–99)
NonHDL: 137.08
Total CHOL/HDL Ratio: 4
Triglycerides: 87 mg/dL (ref 0.0–149.0)
VLDL: 17.4 mg/dL (ref 0.0–40.0)

## 2016-03-23 LAB — PSA: PSA: 0.12 ng/mL (ref 0.10–4.00)

## 2016-03-24 LAB — HEPATITIS C ANTIBODY: HCV AB: NEGATIVE

## 2016-03-27 ENCOUNTER — Ambulatory Visit (INDEPENDENT_AMBULATORY_CARE_PROVIDER_SITE_OTHER): Payer: BLUE CROSS/BLUE SHIELD | Admitting: Family Medicine

## 2016-03-27 ENCOUNTER — Encounter: Payer: Self-pay | Admitting: Family Medicine

## 2016-03-27 VITALS — BP 100/84 | HR 60 | Temp 98.0°F | Ht 72.0 in | Wt 216.2 lb

## 2016-03-27 DIAGNOSIS — F528 Other sexual dysfunction not due to a substance or known physiological condition: Secondary | ICD-10-CM

## 2016-03-27 DIAGNOSIS — K219 Gastro-esophageal reflux disease without esophagitis: Secondary | ICD-10-CM | POA: Diagnosis not present

## 2016-03-27 DIAGNOSIS — Z0000 Encounter for general adult medical examination without abnormal findings: Secondary | ICD-10-CM

## 2016-03-27 DIAGNOSIS — R229 Localized swelling, mass and lump, unspecified: Secondary | ICD-10-CM | POA: Insufficient documentation

## 2016-03-27 DIAGNOSIS — E785 Hyperlipidemia, unspecified: Secondary | ICD-10-CM

## 2016-03-27 DIAGNOSIS — L720 Epidermal cyst: Secondary | ICD-10-CM

## 2016-03-27 DIAGNOSIS — R7303 Prediabetes: Secondary | ICD-10-CM

## 2016-03-27 NOTE — Progress Notes (Signed)
Pre visit review using our clinic review tool, if applicable. No additional management support is needed unless otherwise documented below in the visit note. 

## 2016-03-27 NOTE — Progress Notes (Signed)
BP 100/84   Pulse 60   Temp 98 F (36.7 C) (Oral)   Ht 6' (1.829 m)   Wt 216 lb 4 oz (98.1 kg)   BMI 29.33 kg/m    CC: CPE Subjective:    Patient ID: Mark Martinez, male    DOB: 1950/12/02, 65 y.o.   MRN: [id number]  HPI: Mark Martinez is a 65 y.o. male presenting on 03/27/2016 for Annual Exam   GERD - stable on nexium, breakthrough sxs if dose missed. Ongoing tinnitus. Hearing may be deteriorating. High noise exposure.  Would like posterior neck cyst removed. L posterior neck skin lesion present for years, bleeds if scratched. Scabs over easily. Hasn't tried anything for this.  Preventative: Colonoscopy - 2005, WNL rec rpt 10 years. Stool kit normal 3 yrs ago, did not return last 2 years' kits.  Prostate - Given all normal checks in the past, requests Q41yrscreening due 2018 Flu shot today Tdap 2014.  zostavax - discussed. Will check with insurance. Seatbelt use discussed.  Sunscreen use discussed. No changing moles on skin. Non smoker Alcohol - none  Caffeine: rare  Lives with wife, 1 dog.  Grown children  Occupation: Works in lBiomedical scientist  Activity: stays active at work  Diet: daily fruits/vegetables, some water, decreased sweets  Relevant past medical, surgical, family and social history reviewed and updated as indicated. Interim medical history since our last visit reviewed. Allergies and medications reviewed and updated. Current Outpatient Prescriptions on File Prior to Visit  Medication Sig  . acetaminophen (TYLENOL) 325 MG tablet Take 650 mg by mouth every 6 (six) hours as needed for moderate pain.   .Marland Kitchenesomeprazole (NEXIUM) 40 MG capsule TAKE 1 CAPSULE (40 MG TOTAL) BY MOUTH DAILY.  . sildenafil (REVATIO) 20 MG tablet Take 2 tablets (40 mg total) by mouth as needed.  . sildenafil (REVATIO) 20 MG tablet TAKE 3 TO 5 TABLETS BY MOUTH THREE TIMES A DAY OR AS DIRECTED   No current facility-administered medications on file prior to visit.     Review  of Systems  Constitutional: Negative for activity change, appetite change, chills, fatigue, fever and unexpected weight change.  HENT: Negative for hearing loss.   Eyes: Negative for visual disturbance.  Respiratory: Negative for cough, chest tightness, shortness of breath and wheezing.   Cardiovascular: Negative for chest pain, palpitations and leg swelling.  Gastrointestinal: Negative for abdominal distention, abdominal pain, blood in stool, constipation, diarrhea, nausea and vomiting.  Genitourinary: Negative for difficulty urinating and hematuria.  Musculoskeletal: Negative for arthralgias, myalgias and neck pain.  Skin: Negative for rash.  Neurological: Negative for dizziness, seizures, syncope and headaches.  Hematological: Negative for adenopathy. Does not bruise/bleed easily.  Psychiatric/Behavioral: Negative for dysphoric mood. The patient is not nervous/anxious.    Per HPI unless specifically indicated in ROS section     Objective:    BP 100/84   Pulse 60   Temp 98 F (36.7 C) (Oral)   Ht 6' (1.829 m)   Wt 216 lb 4 oz (98.1 kg)   BMI 29.33 kg/m   Wt Readings from Last 3 Encounters:  03/27/16 216 lb 4 oz (98.1 kg)  07/28/15 217 lb 12 oz (98.8 kg)  06/23/15 220 lb (99.8 kg)    Physical Exam  Constitutional: He is oriented to person, place, and time. He appears well-developed and well-nourished. No distress.  HENT:  Head: Normocephalic and atraumatic.  Right Ear: Hearing, tympanic membrane, external ear and ear canal normal.  Left Ear: Hearing, tympanic membrane, external ear and ear canal normal.  Nose: Nose normal.  Mouth/Throat: Uvula is midline, oropharynx is clear and moist and mucous membranes are normal. No oropharyngeal exudate, posterior oropharyngeal edema or posterior oropharyngeal erythema.  Eyes: Conjunctivae and EOM are normal. Pupils are equal, round, and reactive to light. No scleral icterus.  Neck: Normal range of motion. Neck supple. No thyromegaly  present.  Cardiovascular: Normal rate, regular rhythm, normal heart sounds and intact distal pulses.   No murmur heard. Pulses:      Radial pulses are 2+ on the right side, and 2+ on the left side.  Pulmonary/Chest: Effort normal and breath sounds normal. No respiratory distress. He has no wheezes. He has no rales.  Abdominal: Soft. Bowel sounds are normal. He exhibits no distension and no mass. There is no tenderness. There is no rebound and no guarding.  Musculoskeletal: Normal range of motion. He exhibits no edema.  Lymphadenopathy:    He has no cervical adenopathy.  Neurological: He is alert and oriented to person, place, and time.  CN grossly intact, station and gait intact  Skin: Skin is warm and dry. No rash noted.  Non inflamed cyst L posterior neck Small nodule L lateral neck without erythema or telangectasias  Psychiatric: He has a normal mood and affect. His behavior is normal. Judgment and thought content normal.  Nursing note and vitals reviewed.  Results for orders placed or performed in visit on 03/23/16  Lipid panel  Result Value Ref Range   Cholesterol 184 0 - 200 mg/dL   Triglycerides 87.0 0.0 - 149.0 mg/dL   HDL 46.90 >39.00 mg/dL   VLDL 17.4 0.0 - 40.0 mg/dL   LDL Cholesterol 120 (H) 0 - 99 mg/dL   Total CHOL/HDL Ratio 4    NonHDL 137.08   Hemoglobin A1c  Result Value Ref Range   Hgb A1c MFr Bld 6.1 4.6 - 6.5 %  PSA  Result Value Ref Range   PSA 0.12 0.10 - 4.00 ng/mL  Basic metabolic panel  Result Value Ref Range   Sodium 139 135 - 145 mEq/L   Potassium 4.9 3.5 - 5.1 mEq/L   Chloride 103 96 - 112 mEq/L   CO2 30 19 - 32 mEq/L   Glucose, Bld 100 (H) 70 - 99 mg/dL   BUN 18 6 - 23 mg/dL   Creatinine, Ser 1.07 0.40 - 1.50 mg/dL   Calcium 9.3 8.4 - 10.5 mg/dL   GFR 73.97 >60.00 mL/min  Hepatitis C antibody  Result Value Ref Range   HCV Ab NEGATIVE NEGATIVE      Assessment & Plan:   Problem List Items Addressed This Visit    Epidermal inclusion cyst      No infection. Pt will return for excision.       ERECTILE DYSFUNCTION    Doing well on revatio.      GASTROESOPHAGEAL REFLUX DISEASE    Chronic, stable. Continue daily PPI.       Healthcare maintenance - Primary    Preventative protocols reviewed and updated unless pt declined. Discussed healthy diet and lifestyle.       HLD (hyperlipidemia)    Chronic, improved. Mild off meds. Congratulated on healthy diet changes to improve triglycerides - less sweets and sweetened beverages. Recommended mediterranean diet given fmhx CAD (Father) ASCVD 10 yr risk = 7%.       Prediabetes    Stable readings despite efforts to decrease added sugars over last year.  Skin nodule    Left neck. Looks benign, will continue to monitor. Pt endorse intermittent bleeding. Consider excision if enlarging or any change.        Other Visit Diagnoses   None.      Follow up plan: Return in about 1 year (around 03/27/2017), or as needed, for annual exam, prior fasting for blood work.  Ria Bush, MD

## 2016-03-27 NOTE — Assessment & Plan Note (Signed)
Chronic, stable. Continue daily PPI.

## 2016-03-27 NOTE — Assessment & Plan Note (Signed)
No infection. Pt will return for excision.

## 2016-03-27 NOTE — Assessment & Plan Note (Signed)
Doing well on revatio.

## 2016-03-27 NOTE — Assessment & Plan Note (Addendum)
Chronic, improved. Mild off meds. Congratulated on healthy diet changes to improve triglycerides - less sweets and sweetened beverages. Recommended mediterranean diet given fmhx CAD (Father) ASCVD 10 yr risk = 7%.

## 2016-03-27 NOTE — Patient Instructions (Addendum)
Flu shot today Pass by lab to pick up stool kit.  Schedule appointment to have cyst removed.  Call your insurance about the shingles shot to see if it is covered or how much it would cost and where is cheaper (here or pharmacy).  If you want to receive here, call for nurse visit.  I recommend mediterranean diet.  You are doing well today, congratulations on improved cholesterol levels from healthier diet choices!     Why follow it? Research shows. . Those who follow the Mediterranean diet have a reduced risk of heart disease  . The diet is associated with a reduced incidence of Parkinson's and Alzheimer's diseases . People following the diet may have longer life expectancies and lower rates of chronic diseases  . The Dietary Guidelines for Americans recommends the Mediterranean diet as an eating plan to promote health and prevent disease  What Is the Mediterranean Diet?  . Healthy eating plan based on typical foods and recipes of Mediterranean-style cooking . The diet is primarily a plant based diet; these foods should make up a majority of meals   Starches - Plant based foods should make up a majority of meals - They are an important sources of vitamins, minerals, energy, antioxidants, and fiber - Choose whole grains, foods high in fiber and minimally processed items  - Typical grain sources include wheat, oats, barley, corn, brown rice, bulgar, farro, millet, polenta, couscous  - Various types of beans include chickpeas, lentils, fava beans, black beans, white beans   Fruits  Veggies - Large quantities of antioxidant rich fruits & veggies; 6 or more servings  - Vegetables can be eaten raw or lightly drizzled with oil and cooked  - Vegetables common to the traditional Mediterranean Diet include: artichokes, arugula, beets, broccoli, brussel sprouts, cabbage, carrots, celery, collard greens, cucumbers, eggplant, kale, leeks, lemons, lettuce, mushrooms, okra, onions, peas, peppers, potatoes,  pumpkin, radishes, rutabaga, shallots, spinach, sweet potatoes, turnips, zucchini - Fruits common to the Mediterranean Diet include: apples, apricots, avocados, cherries, clementines, dates, figs, grapefruits, grapes, melons, nectarines, oranges, peaches, pears, pomegranates, strawberries, tangerines  Fats - Replace butter and margarine with healthy oils, such as olive oil, canola oil, and tahini  - Limit nuts to no more than a handful a day  - Nuts include walnuts, almonds, pecans, pistachios, pine nuts  - Limit or avoid candied, honey roasted or heavily salted nuts - Olives are central to the [employer] - can be eaten whole or used in a variety of dishes   Meats Protein - Limiting red meat: no more than a few times a month - When eating red meat: choose lean cuts and keep the portion to the size of deck of cards - Eggs: approx. 0 to 4 times a week  - Fish and lean poultry: at least 2 a week  - Healthy protein sources include, chicken, [place], lean beef, lamb - Increase intake of seafood such as tuna, salmon, trout, mackerel, shrimp, scallops - Avoid or limit high fat processed meats such as sausage and bacon  Dairy - Include moderate amounts of low fat dairy products  - Focus on healthy dairy such as fat free yogurt, skim milk, low or reduced fat cheese - Limit dairy products higher in fat such as whole or 2% milk, cheese, ice cream  Alcohol - Moderate amounts of red wine is ok  - No more than 5 oz daily for women (all ages) and men older than age 92  - No  more than 10 oz of wine daily for men younger than 39  Other - Limit sweets and other desserts  - Use herbs and spices instead of salt to flavor foods  - Herbs and spices common to the traditional Mediterranean Diet include: basil, bay leaves, chives, cloves, cumin, fennel, garlic, lavender, marjoram, mint, oregano, parsley, pepper, rosemary, sage, savory, sumac, tarragon, thyme   It's not just a diet, it's a lifestyle:  . The  Mediterranean diet includes lifestyle factors typical of those in the region  . Foods, drinks and meals are best eaten with others and savored . Daily physical activity is important for overall good health . This could be strenuous exercise like running and aerobics . This could also be more leisurely activities such as walking, housework, yard-work, or taking the stairs . Moderation is the key; a balanced and healthy diet accommodates most foods and drinks . Consider portion sizes and frequency of consumption of certain foods   Meal Ideas & Options:  . Breakfast:  o Whole wheat toast or whole wheat English muffins with peanut butter & hard boiled egg o Steel cut oats topped with apples & cinnamon and skim milk  o Fresh fruit: banana, strawberries, melon, berries, peaches  o Smoothies: strawberries, bananas, greek yogurt, peanut butter o Low fat greek yogurt with blueberries and granola  o Egg white omelet with spinach and mushrooms o Breakfast couscous: whole wheat couscous, apricots, skim milk, cranberries  . Sandwiches:  o Hummus and grilled vegetables (peppers, zucchini, squash) on whole wheat bread   o Grilled chicken on whole wheat pita with lettuce, tomatoes, cucumbers or tzatziki  o Tuna salad on whole wheat bread: tuna salad made with greek yogurt, olives, red peppers, capers, green onions o Garlic rosemary lamb pita: lamb sauted with garlic, rosemary, salt & pepper; add lettuce, cucumber, greek yogurt to pita - flavor with lemon juice and black pepper  . Seafood:  o Mediterranean grilled salmon, seasoned with garlic, basil, parsley, lemon juice and black pepper o Shrimp, lemon, and spinach whole-grain pasta salad made with low fat greek yogurt  o Seared scallops with lemon orzo  o Seared tuna steaks seasoned salt, pepper, coriander topped with tomato mixture of olives, tomatoes, olive oil, minced garlic, parsley, green onions and cappers  . Meats:  o Herbed greek chicken salad  with kalamata olives, cucumber, feta  o Red bell peppers stuffed with spinach, bulgur, lean ground beef (or lentils) & topped with feta   o Kebabs: skewers of chicken, tomatoes, onions, zucchini, squash  o [place] burgers: made with red onions, mint, dill, lemon juice, feta cheese topped with roasted red peppers . Vegetarian o Cucumber salad: cucumbers, artichoke hearts, celery, red onion, feta cheese, tossed in olive oil & lemon juice  o Hummus and whole grain pita points with a greek salad (lettuce, tomato, feta, olives, cucumbers, red onion) o Lentil soup with celery, carrots made with vegetable broth, garlic, salt and pepper  o Tabouli salad: parsley, bulgur, mint, scallions, cucumbers, tomato, radishes, lemon juice, olive oil, salt and pepper.

## 2016-03-27 NOTE — Assessment & Plan Note (Signed)
Stable readings despite efforts to decrease added sugars over last year.

## 2016-03-27 NOTE — Assessment & Plan Note (Signed)
Preventative protocols reviewed and updated unless pt declined. Discussed healthy diet and lifestyle.  

## 2016-03-27 NOTE — Assessment & Plan Note (Signed)
Left neck. Looks benign, will continue to monitor. Pt endorse intermittent bleeding. Consider excision if enlarging or any change.

## 2016-03-29 ENCOUNTER — Other Ambulatory Visit: Payer: Self-pay | Admitting: Family Medicine

## 2016-04-30 ENCOUNTER — Ambulatory Visit: Payer: BLUE CROSS/BLUE SHIELD | Admitting: Family Medicine

## 2016-05-15 ENCOUNTER — Ambulatory Visit (INDEPENDENT_AMBULATORY_CARE_PROVIDER_SITE_OTHER): Payer: BLUE CROSS/BLUE SHIELD | Admitting: Family Medicine

## 2016-05-15 ENCOUNTER — Encounter: Payer: Self-pay | Admitting: Family Medicine

## 2016-05-15 VITALS — BP 120/86 | HR 61 | Temp 97.6°F | Resp 16 | Ht 72.0 in | Wt 218.0 lb

## 2016-05-15 DIAGNOSIS — L720 Epidermal cyst: Secondary | ICD-10-CM

## 2016-05-15 NOTE — Progress Notes (Signed)
Pre visit review using our clinic review tool, if applicable. No additional management support is needed unless otherwise documented below in the visit note. 

## 2016-05-15 NOTE — Progress Notes (Addendum)
   BP 120/86 (BP Location: Left Arm, Patient Position: Sitting, Cuff Size: Normal)   Pulse 61   Temp 97.6 F (36.4 C) (Oral)   Resp 16   Ht 6' (1.829 m)   Wt 218 lb (98.9 kg)   SpO2 97%   BMI 29.57 kg/m    CC: cyst Subjective:    Patient ID: Mark Martinez, male    DOB: Apr 28, 1951, 65 y.o.   MRN: [id number]  HPI: Mark Martinez is a 65 y.o. male presenting on 05/15/2016 for Cyst   Here for epidermal inclusion cyst excision. L posterior neck, present for years, bleeds if scratched. Scabs over easily. Has drained in the past. Enlarging.   Relevant past medical, surgical, family and social history reviewed and updated as indicated. Interim medical history since our last visit reviewed. Allergies and medications reviewed and updated. Current Outpatient Prescriptions on File Prior to Visit  Medication Sig  . acetaminophen (TYLENOL) 325 MG tablet Take 650 mg by mouth every 6 (six) hours as needed for moderate pain.   Marland Kitchen esomeprazole (NEXIUM) 40 MG capsule TAKE 1 CAPSULE (40 MG TOTAL) BY MOUTH DAILY.  . sildenafil (REVATIO) 20 MG tablet Take 2 tablets (40 mg total) by mouth as needed.  . sildenafil (REVATIO) 20 MG tablet TAKE 3 TO 5 TABLETS BY MOUTH THREE TIMES A DAY OR AS DIRECTED   No current facility-administered medications on file prior to visit.     Review of Systems Per HPI unless specifically indicated in ROS section     Objective:    BP 120/86 (BP Location: Left Arm, Patient Position: Sitting, Cuff Size: Normal)   Pulse 61   Temp 97.6 F (36.4 C) (Oral)   Resp 16   Ht 6' (1.829 m)   Wt 218 lb (98.9 kg)   SpO2 97%   BMI 29.57 kg/m   Wt Readings from Last 3 Encounters:  05/15/16 218 lb (98.9 kg)  03/27/16 216 lb 4 oz (98.1 kg)  07/28/15 217 lb 12 oz (98.8 kg)    Physical Exam  Constitutional: He appears well-developed and well-nourished. No distress.  Skin:  Cyst L upper back above scapula  Nursing note and vitals reviewed.    Excision, incision and  drainage of epidermal cyst Meds, vitals, and allergies reviewed.  Indication: recurrent draining epidermal cyst Pt complaints of: discomfort, recurrent drainage Location: R upper back Size: 2cm diameter Verbal informed consent obtained.  Pt aware of risks not limited to but including infection, bleeding, damage to near by organs. Prep: etoh/betadine Anesthesia: 74mL 1%lidocaine with epi, good effect 2.5cm transverse incision made with #11 blade underneath cyst Wound explored and cyst drained, then as much of wall as able was removed. Wound irrigated with normal saline and cleaned again with betadine.  Closure: 2 3-0 ethilon sutures placed Wound dressed with antibiotic ointment and bandage Pt tolerated well. Aftercare instructions provided. RTC 7d stitch removal.     Assessment & Plan:   Problem List Items Addressed This Visit    Epidermal inclusion cyst - Primary    I&D and excision performed today, pt tolerated well. Discussed tylenol use prn discomfort. To update [place] of worsening pain. Red flags to seek urgent care discussed, namely signs of infection. RTC 7d suture removal.           Follow up plan: Return in about 4 weeks (around 06/12/2016).  Ria Bush, MD

## 2016-05-15 NOTE — Assessment & Plan Note (Signed)
I&D and excision performed today, pt tolerated well. Discussed tylenol use prn discomfort. To update [place] of worsening pain. Red flags to seek urgent care discussed, namely signs of infection. RTC 7d suture removal.

## 2016-05-15 NOTE — Patient Instructions (Addendum)
Epidermal cyst removed today.  Dress daily with antibiotic ointment.  Return Monday afternoon for wound check/ suture removal.  Return sooner if fever, draining pus, streaking redness from wound  Excision of Skin Lesions, Care After Introduction Refer to this sheet in the next few weeks. These instructions provide you with information about caring for yourself after your procedure. Your health care provider may also give you more specific instructions. Your treatment has been planned according to current medical practices, but problems sometimes occur. Call your health care provider if you have any problems or questions after your procedure. What can I expect after the procedure? After your procedure, it is common to have pain or discomfort at the excision site. Follow these instructions at home:  Take over-the-counter and prescription medicines only as told by your health care provider.  Follow instructions from your health care provider about:  How to take care of your excision site. You should keep the site clean, dry, and protected for at least 48 hours.  When and how you should change your bandage (dressing).  When you should remove your dressing.  Removing whatever was used to close your excision site.  Check the excision area every day for signs of infection. Watch for:  Redness, swelling, or pain.  Fluid, blood, or pus.  For bleeding, apply gentle but firm pressure to the area using a folded towel for 20 minutes.  Avoid high-impact exercise and activities until the stitches (sutures) are removed or the area heals.  Follow instructions from your health care provider about how to minimize scarring. Avoid sun exposure until the area has healed. Scarring should lessen over time.  Keep all follow-up visits as told by your health care provider. This is important. Contact a health care provider if:  You have a fever.  You have redness, swelling, or pain at the excision  site.  You have fluid, blood, or pus coming from the excision site.  You have ongoing bleeding at the excision site.  You have pain that does not improve in 2-3 days after your procedure.  You notice skin irregularities or changes in sensation. This information is not intended to replace advice given to you by your health care provider. Make sure you discuss any questions you have with your health care provider. Document Released: 10/19/2014 Document Revised: 11/10/2015 Document Reviewed: 07/21/2014  2017 Elsevier

## 2016-05-21 ENCOUNTER — Encounter: Payer: Self-pay | Admitting: Family Medicine

## 2016-05-21 ENCOUNTER — Ambulatory Visit (INDEPENDENT_AMBULATORY_CARE_PROVIDER_SITE_OTHER): Payer: BLUE CROSS/BLUE SHIELD | Admitting: Family Medicine

## 2016-05-21 ENCOUNTER — Telehealth: Payer: Self-pay | Admitting: Family Medicine

## 2016-05-21 VITALS — BP 122/82 | HR 68 | Temp 98.3°F | Wt 218.0 lb

## 2016-05-21 DIAGNOSIS — L720 Epidermal cyst: Secondary | ICD-10-CM

## 2016-05-21 MED ORDER — CEPHALEXIN 500 MG PO CAPS: 500.0000 mg | ORAL_CAPSULE | Freq: Three times a day (TID) | ORAL | 0 refills | Status: DC

## 2016-05-21 NOTE — Patient Instructions (Addendum)
Stitches removed. Steri strips placed - should fall off over next few days.  Cyst looks like it's recurring unfortunately. Will treat with keflex antibiotic course to see if that is enough to calm area down.  If recurrent, may need to reattempt - and we would refer you to dermatologist.  Watch for signs of infection - streaking redness or warmth.

## 2016-05-21 NOTE — Telephone Encounter (Signed)
Can he come in now to be checked, but keep appt on schedule at 2:45pm.

## 2016-05-21 NOTE — Telephone Encounter (Signed)
Pt called and states that spouse said there was redness around the wound area and he would like to be seen before his 2:45pm appointment scheduled for today.  There are no more openings for PCP, pt prefers to see Dr. Darnell Level only.  Best number to call is [phone] if able to see sooner.

## 2016-05-21 NOTE — Telephone Encounter (Signed)
Patient notified and will come now.

## 2016-05-21 NOTE — Progress Notes (Signed)
Pre visit review using our clinic review tool, if applicable. No additional management support is needed unless otherwise documented below in the visit note. 

## 2016-05-21 NOTE — Assessment & Plan Note (Signed)
Rapid return of cyst unfortunately.  Stitches removed, steri strips applied.  Will cover with keflex course largely for anti inflammatory properties, if recurrent cyst will refer to dermatology - pt agrees with plan.

## 2016-05-21 NOTE — Progress Notes (Signed)
   BP 122/82   Pulse 68   Temp 98.3 F (36.8 C) (Oral)   Wt 218 lb (98.9 kg)   BMI 29.57 kg/m    CC: wound check, suture removal Subjective:    Patient ID: Mark Martinez, male    DOB: [birth year], 65 y.o.   MRN: [id number]  HPI: Mark Martinez is a 65 y.o. male presenting on 05/21/2016 for Wound Check   05/15/2016 I&D and excision of epidermal inclusion cyst s/p closure with 2 simple interrupted sutures.   Over last 2 days area reaccumulating and bulging again. Mild erythema and itching but no pain.  Relevant past medical, surgical, family and social history reviewed and updated as indicated. Interim medical history since our last visit reviewed. Allergies and medications reviewed and updated. Current Outpatient Prescriptions on File Prior to Visit  Medication Sig  . acetaminophen (TYLENOL) 325 MG tablet Take 650 mg by mouth every 6 (six) hours as needed for moderate pain.   Marland Kitchen esomeprazole (NEXIUM) 40 MG capsule TAKE 1 CAPSULE (40 MG TOTAL) BY MOUTH DAILY.  . sildenafil (REVATIO) 20 MG tablet TAKE 3 TO 5 TABLETS BY MOUTH THREE TIMES A DAY OR AS DIRECTED   No current facility-administered medications on file prior to visit.     Review of Systems Per HPI unless specifically indicated in ROS section     Objective:    BP 122/82   Pulse 68   Temp 98.3 F (36.8 C) (Oral)   Wt 218 lb (98.9 kg)   BMI 29.57 kg/m   Wt Readings from Last 3 Encounters:  05/21/16 218 lb (98.9 kg)  05/15/16 218 lb (98.9 kg)  03/27/16 216 lb 4 oz (98.1 kg)    Physical Exam  Constitutional: He appears well-developed and well-nourished. No distress.  Musculoskeletal: He exhibits no edema.  Skin: Skin is warm and dry.  Fluctuant area that has reaccumulated L upper back above scapula without significant redness, pain, warmth Incision healing well - cleaned with alcohol pad Sutures removed, steri strips applied.   Nursing note and vitals reviewed.     Assessment & Plan:   Problem List Items  Addressed This Visit    Epidermal inclusion cyst - Primary    Rapid return of cyst unfortunately.  Stitches removed, steri strips applied.  Will cover with keflex course largely for anti inflammatory properties, if recurrent cyst will refer to dermatology - pt agrees with plan.           Follow up plan: Return if symptoms worsen or fail to improve.  Ria Bush, MD

## 2016-05-22 ENCOUNTER — Telehealth: Payer: Self-pay | Admitting: *Deleted

## 2016-05-22 ENCOUNTER — Encounter: Payer: Self-pay | Admitting: Internal Medicine

## 2016-05-22 ENCOUNTER — Ambulatory Visit (INDEPENDENT_AMBULATORY_CARE_PROVIDER_SITE_OTHER): Payer: BLUE CROSS/BLUE SHIELD | Admitting: Internal Medicine

## 2016-05-22 VITALS — BP 120/78 | HR 65 | Temp 98.0°F | Wt 220.0 lb

## 2016-05-22 DIAGNOSIS — L02212 Cutaneous abscess of back [any part, except buttock]: Secondary | ICD-10-CM

## 2016-05-22 DIAGNOSIS — L720 Epidermal cyst: Secondary | ICD-10-CM

## 2016-05-22 NOTE — Patient Instructions (Signed)

## 2016-05-22 NOTE — Addendum Note (Signed)
Addended by: Lurlean Nanny on: 05/22/2016 02:02 PM   Modules accepted: Orders

## 2016-05-22 NOTE — Telephone Encounter (Signed)
Yes he should. If no appt available, add him on to my schedule at 12:45

## 2016-05-22 NOTE — Telephone Encounter (Signed)
Patient left a voicemail stating that he was in last week and had a cyst removed from his back and returned yesterday to have stitches removed. Patient stated that he was started on an antibiotic yesterday because the area was puffed up. Patient stated that it was puffed up a lot more this morning and it opened itself up and drained. Patient wants to know if he should come in and let someone look at it?

## 2016-05-22 NOTE — Telephone Encounter (Signed)
Patient notified by telephone to be at office at 12:45 to see Webb Silversmith NP.

## 2016-05-22 NOTE — Progress Notes (Signed)
Subjective:    Patient ID: Mark Martinez, male    DOB: 07-06-1950, 65 y.o.   MRN: [id number]  HPI  Pt presents to the clinic today for follow up for recheck epidermal inclusion cyst. He was seen 05/15/16 for same, I&D with excision was performed. The area was closed with sutures. He came back 05/21/16 to have sutures removed. It was noted that area was inflamed and red. He was started on Keflex for return of infection. This morning, he reports the area opened up, and is now draining pus. He denies fever, chills or body aches.  Review of Systems  Past Medical History:  Diagnosis Date  . ED (erectile dysfunction)   . GERD (gastroesophageal reflux disease)    with hiatal hernia  . H/O hiatal hernia   . History of prostatitis   . HLD (hyperlipidemia)   . Prediabetes 07/28/2015  . Shortness of breath    exersion    Current Outpatient Prescriptions  Medication Sig Dispense Refill  . acetaminophen (TYLENOL) 325 MG tablet Take 650 mg by mouth every 6 (six) hours as needed for moderate pain.     . cephALEXin (KEFLEX) 500 MG capsule Take 1 capsule (500 mg total) by mouth 3 (three) times daily. 15 capsule 0  . esomeprazole (NEXIUM) 40 MG capsule TAKE 1 CAPSULE (40 MG TOTAL) BY MOUTH DAILY. 90 capsule 3  . sildenafil (REVATIO) 20 MG tablet TAKE 3 TO 5 TABLETS BY MOUTH THREE TIMES A DAY OR AS DIRECTED 30 tablet 3   No current facility-administered medications for this visit.     Allergies  Allergen Reactions  . Codeine Other (See Comments)    Keeps him awake    Family History  Problem Relation Age of Onset  . Valvular heart disease Mother   . Ulcers Father   . CAD Father 45    multiple stents  . Cancer Neg Hx   . Diabetes Neg Hx   . Stroke Neg Hx   . CAD Paternal Uncle     Social History   Social History  . Marital status: Married    Spouse name: N/A  . Number of children: 3  . Years of education: N/A   Occupational History  . seeding and landscaping services Other    Social History Main Topics  . Smoking status: Never Smoker  . Smokeless tobacco: Former Systems developer    Quit date: 01/30/2009  . Alcohol use No  . Drug use: No  . Sexual activity: Not on file   Other Topics Concern  . Not on file   Social History Narrative   Caffeine: rare   Lives with wife, 1 dog.   Grown children   Occupation: Works in Biomedical scientist.   Activity: stays active at work   Diet: daily fruits/vegetables, some water     Constitutional: Denies fever, malaise, fatigue, headache or abrupt weight changes.  Skin: Pt reports cyst to left upper back. Denies rashes or ulcercations.   No other specific complaints in a complete review of systems (except as listed in HPI above).     Objective:   Physical Exam  There were no vitals taken for this visit. Wt Readings from Last 3 Encounters:  05/21/16 218 lb (98.9 kg)  05/15/16 218 lb (98.9 kg)  03/27/16 216 lb 4 oz (98.1 kg)    General: Appears his stated age, well developed, well nourished in NAD. Skin: 1 cm area of redness and swelling to left upper back. Incision opened. Able  to express about 2.5 mL pus and blood. No cellulitis noted.  BMET    Component Value Date/Time   NA 139 03/23/2016 0815   K 4.9 03/23/2016 0815   CL 103 03/23/2016 0815   CO2 30 03/23/2016 0815   GLUCOSE 100 (H) 03/23/2016 0815   BUN 18 03/23/2016 0815   CREATININE 1.07 03/23/2016 0815   CALCIUM 9.3 03/23/2016 0815   GFRNONAA 79 (L) 08/26/2013 0826   GFRAA >90 08/26/2013 0826    Lipid Panel     Component Value Date/Time   CHOL 184 03/23/2016 0815   TRIG 87.0 03/23/2016 0815   HDL 46.90 03/23/2016 0815   CHOLHDL 4 03/23/2016 0815   VLDL 17.4 03/23/2016 0815   LDLCALC 120 (H) 03/23/2016 0815    CBC    Component Value Date/Time   WBC 6.2 08/26/2013 0826   RBC 5.09 08/26/2013 0826   HGB 13.4 08/26/2013 0826   HCT 41.6 08/26/2013 0826   PLT 337 08/26/2013 0826   MCV 81.7 08/26/2013 0826   MCH 26.3 08/26/2013 0826   MCHC 32.2  08/26/2013 0826   RDW 17.2 (H) 08/26/2013 0826    Hgb A1C Lab Results  Component Value Date   HGBA1C 6.1 03/23/2016        Assessment & Plan:   Epidermal cyst of left upper back:  It does not appear sac has reformed Appears to be residual infection Culture obtained Continue Keflex TID x 7 days Put neosporin over area and cover with bandaid, change daily Return precautions discussed  RTC as needed or if symptoms persist or worsen Sherleen Pangborn, NP

## 2016-05-22 NOTE — Addendum Note (Signed)
Addended by: Marchia Bond on: 05/22/2016 02:01 PM   Modules accepted: Orders

## 2016-05-25 LAB — WOUND CULTURE
GRAM STAIN: NONE SEEN
Gram Stain: NONE SEEN
Organism ID, Bacteria: NORMAL

## 2016-11-01 ENCOUNTER — Other Ambulatory Visit: Payer: Self-pay | Admitting: Family Medicine

## 2017-03-20 ENCOUNTER — Other Ambulatory Visit: Payer: Self-pay | Admitting: Family Medicine

## 2017-03-28 ENCOUNTER — Other Ambulatory Visit (INDEPENDENT_AMBULATORY_CARE_PROVIDER_SITE_OTHER): Payer: BLUE CROSS/BLUE SHIELD

## 2017-03-28 ENCOUNTER — Other Ambulatory Visit: Payer: Self-pay | Admitting: Family Medicine

## 2017-03-28 DIAGNOSIS — E785 Hyperlipidemia, unspecified: Secondary | ICD-10-CM

## 2017-03-28 DIAGNOSIS — R7303 Prediabetes: Secondary | ICD-10-CM

## 2017-03-28 DIAGNOSIS — Z125 Encounter for screening for malignant neoplasm of prostate: Secondary | ICD-10-CM

## 2017-03-28 LAB — LIPID PANEL
CHOLESTEROL: 183 mg/dL (ref 0–200)
HDL: 46.1 mg/dL (ref >39.00)
LDL Cholesterol: 114 mg/dL — ABNORMAL HIGH (ref 0–99)
NONHDL: 136.66
Total CHOL/HDL Ratio: 4
Triglycerides: 115 mg/dL (ref 0.0–149.0)
VLDL: 23 mg/dL (ref 0.0–40.0)

## 2017-03-28 LAB — COMPREHENSIVE METABOLIC PANEL
ALBUMIN: 3.8 g/dL (ref 3.5–5.2)
ALT: 19 U/L (ref 0–53)
AST: 19 U/L (ref 0–37)
Alkaline Phosphatase: 89 U/L (ref 39–117)
BILIRUBIN TOTAL: 0.6 mg/dL (ref 0.2–1.2)
BUN: 19 mg/dL (ref 6–23)
CO2: 32 mEq/L (ref 19–32)
Calcium: 8.5 mg/dL (ref 8.4–10.5)
Chloride: 103 mEq/L (ref 96–112)
Creatinine, Ser: 1.12 mg/dL (ref 0.40–1.50)
GFR: 69.95 mL/min (ref >60.00)
Glucose, Bld: 103 mg/dL — ABNORMAL HIGH (ref 70–99)
POTASSIUM: 4.7 meq/L (ref 3.5–5.1)
Sodium: 139 mEq/L (ref 135–145)
TOTAL PROTEIN: 6.5 g/dL (ref 6.0–8.3)

## 2017-03-28 LAB — PSA: PSA: 0.17 ng/mL (ref 0.10–4.00)

## 2017-03-28 LAB — HEMOGLOBIN A1C: Hgb A1c MFr Bld: 6.1 % (ref 4.6–6.5)

## 2017-04-01 ENCOUNTER — Ambulatory Visit (INDEPENDENT_AMBULATORY_CARE_PROVIDER_SITE_OTHER): Payer: BLUE CROSS/BLUE SHIELD | Admitting: Family Medicine

## 2017-04-01 ENCOUNTER — Encounter: Payer: Self-pay | Admitting: Family Medicine

## 2017-04-01 VITALS — BP 118/80 | HR 64 | Temp 97.9°F | Ht 72.5 in | Wt 217.0 lb

## 2017-04-01 DIAGNOSIS — Z0000 Encounter for general adult medical examination without abnormal findings: Secondary | ICD-10-CM | POA: Diagnosis not present

## 2017-04-01 DIAGNOSIS — F528 Other sexual dysfunction not due to a substance or known physiological condition: Secondary | ICD-10-CM | POA: Diagnosis not present

## 2017-04-01 DIAGNOSIS — K219 Gastro-esophageal reflux disease without esophagitis: Secondary | ICD-10-CM | POA: Diagnosis not present

## 2017-04-01 DIAGNOSIS — Z1211 Encounter for screening for malignant neoplasm of colon: Secondary | ICD-10-CM

## 2017-04-01 DIAGNOSIS — R7303 Prediabetes: Secondary | ICD-10-CM | POA: Diagnosis not present

## 2017-04-01 DIAGNOSIS — E785 Hyperlipidemia, unspecified: Secondary | ICD-10-CM

## 2017-04-01 MED ORDER — SILDENAFIL CITRATE 20 MG PO TABS: 40.0000 mg | ORAL_TABLET | Freq: Every day | ORAL | 6 refills | Status: DC | PRN

## 2017-04-01 NOTE — Patient Instructions (Addendum)
If interested, check with pharmacy about new 2 shot shingles series (shingrix).  Pass by lab for stool kit.  Keep an eye on sugars in diet.  Return as needed or in 1 year for next physical.   Health Maintenance, Male A healthy lifestyle and preventive care is important for your health and wellness. Ask your health care provider about what schedule of regular examinations is right for you. What should I know about weight and diet? Eat a Healthy Diet  Eat plenty of vegetables, fruits, whole grains, low-fat dairy products, and lean protein.  Do not eat a lot of foods high in solid fats, added sugars, or salt.  Maintain a Healthy Weight Regular exercise can help you achieve or maintain a healthy weight. You should:  Do at least 150 minutes of exercise each week. The exercise should increase your heart rate and make you sweat (moderate-intensity exercise).  Do strength-training exercises at least twice a week.  Watch Your Levels of Cholesterol and Blood Lipids  Have your blood tested for lipids and cholesterol every 5 years starting at 65 years of age. If you are at high risk for heart disease, you should start having your blood tested when you are 65 years old. You may need to have your cholesterol levels checked more often if: ? Your lipid or cholesterol levels are high. ? You are older than 65 years of age. ? You are at high risk for heart disease.  What should I know about cancer screening? Many types of cancers can be detected early and may often be prevented. Lung Cancer  You should be screened every year for lung cancer if: ? You are a current smoker who has smoked for at least 30 years. ? You are a former smoker who has quit within the past 15 years.  Talk to your health care provider about your screening options, when you should start screening, and how often you should be screened.  Colorectal Cancer  Routine colorectal cancer screening usually begins at 65 years of age  and should be repeated every 5-10 years until you are 65 years old. You may need to be screened more often if early forms of precancerous polyps or small growths are found. Your health care provider may recommend screening at an earlier age if you have risk factors for colon cancer.  Your health care provider may recommend using home test kits to check for hidden blood in the stool.  A small camera at the end of a tube can be used to examine your colon (sigmoidoscopy or colonoscopy). This checks for the earliest forms of colorectal cancer.  Prostate and Testicular Cancer  Depending on your age and overall health, your health care provider may do certain tests to screen for prostate and testicular cancer.  Talk to your health care provider about any symptoms or concerns you have about testicular or prostate cancer.  Skin Cancer  Check your skin from head to toe regularly.  Tell your health care provider about any new moles or changes in moles, especially if: ? There is a change in a mole's size, shape, or color. ? You have a mole that is larger than a pencil eraser.  Always use sunscreen. Apply sunscreen liberally and repeat throughout the day.  Protect yourself by wearing long sleeves, pants, a wide-brimmed hat, and sunglasses when outside.  What should I know about heart disease, diabetes, and high blood pressure?  If you are 60-60 years of age, have your blood  pressure checked every 3-5 years. If you are 69 years of age or older, have your blood pressure checked every year. You should have your blood pressure measured twice-once when you are at a hospital or clinic, and once when you are not at a hospital or clinic. Record the average of the two measurements. To check your blood pressure when you are not at a hospital or clinic, you can use: ? An automated blood pressure machine at a pharmacy. ? A home blood pressure monitor.  Talk to your health care provider about your target blood  pressure.  If you are between 10-25 years old, ask your health care provider if you should take aspirin to prevent heart disease.  Have regular diabetes screenings by checking your fasting blood sugar level. ? If you are at a normal weight and have a low risk for diabetes, have this test once every three years after the age of 101. ? If you are overweight and have a high risk for diabetes, consider being tested at a younger age or more often.  A one-time screening for abdominal aortic aneurysm (AAA) by ultrasound is recommended for men aged 70-75 years who are current or former smokers. What should I know about preventing infection? Hepatitis B If you have a higher risk for hepatitis B, you should be screened for this virus. Talk with your health care provider to find out if you are at risk for hepatitis B infection. Hepatitis C Blood testing is recommended for:  Everyone born from [birth year] through 1964.  Anyone with known risk factors for hepatitis C.  Sexually Transmitted Diseases (STDs)  You should be screened each year for STDs including gonorrhea and chlamydia if: ? You are sexually active and are younger than 65 years of age. ? You are older than 65 years of age and your health care provider tells you that you are at risk for this type of infection. ? Your sexual activity has changed since you were last screened and you are at an increased risk for chlamydia or gonorrhea. Ask your health care provider if you are at risk.  Talk with your health care provider about whether you are at high risk of being infected with HIV. Your health care provider may recommend a prescription medicine to help prevent HIV infection.  What else can I do?  Schedule regular health, dental, and eye exams.  Stay current with your vaccines (immunizations).  Do not use any tobacco products, such as cigarettes, chewing tobacco, and e-cigarettes. If you need help quitting, ask your health care  provider.  Limit alcohol intake to no more than 2 drinks per day. One drink equals 12 ounces of beer, 5 ounces of wine, or 1 ounces of hard liquor.  Do not use street drugs.  Do not share needles.  Ask your health care provider for help if you need support or information about quitting drugs.  Tell your health care provider if you often feel depressed.  Tell your health care provider if you have ever been abused or do not feel safe at home. This information is not intended to replace advice given to you by your health care provider. Make sure you discuss any questions you have with your health care provider. Document Released: 12/01/2007 Document Revised: 02/01/2016 Document Reviewed: 03/08/2015 Elsevier Interactive Patient Education  [employer].

## 2017-04-01 NOTE — Assessment & Plan Note (Signed)
Chronic. Breakthrough sxs if 1 dose missed.

## 2017-04-01 NOTE — Assessment & Plan Note (Signed)
Preventative protocols reviewed and updated unless pt declined. Discussed healthy diet and lifestyle.  

## 2017-04-01 NOTE — Assessment & Plan Note (Signed)
Chronic. Mild off meds.  The 10-year ASCVD risk score Mikey Bussing DC Brooke Bonito., et al., 2013) is: 10.1%   Values used to calculate the score:     Age: 66 years     Sex: Male     Is Non-Hispanic African American: No     Diabetic: No     Tobacco smoker: No     Systolic Blood Pressure: 774 mmHg     Is BP treated: No     HDL Cholesterol: 46.1 mg/dL     Total Cholesterol: 183 mg/dL

## 2017-04-01 NOTE — Assessment & Plan Note (Signed)
Chronic, stable. Encouraged avoiding added sugars. 

## 2017-04-01 NOTE — Progress Notes (Signed)
BP 118/80 (BP Location: Left Arm, Patient Position: Sitting, Cuff Size: Normal)   Pulse 64   Temp 97.9 F (36.6 C) (Oral)   Ht 6' 0.5" (1.842 m)   Wt 217 lb (98.4 kg)   SpO2 94%   BMI 29.03 kg/m    CC: CPE Subjective:    Patient ID: Mark Martinez, male    DOB: October 05, 1950, 66 y.o.   MRN: [id number]  HPI: Mark Martinez is a 66 y.o. male presenting on 04/01/2017 for Annual Exam   Preventative: Colonoscopy - 2005, WNL rec rpt 10 years. Stool kit normal 3 yrs ago, did not return last 3 years' kits. Prostate - Given all normal checks in the past, requests Q77yrscreening due 2018. Declines this year, agrees to 2019.  Flu shot yearly Tdap 2014  Shingrix - discussed Seatbelt use discussed.  Sunscreen use discussed. No changing moles on skin. Non smoker Alcohol - none  Caffeine: rare  Lives with wife, 1 dog.  Grown children  Occupation: Works in lBiomedical scientist  Activity: stays active at work  Diet: daily fruits/vegetables, some water, decreased sweets   Relevant past medical, surgical, family and social history reviewed and updated as indicated. Interim medical history since our last visit reviewed. Allergies and medications reviewed and updated. Outpatient Medications Prior to Visit  Medication Sig Dispense Refill  . acetaminophen (TYLENOL) 325 MG tablet Take 650 mg by mouth every 6 (six) hours as needed for moderate pain.     .Marland Kitchenesomeprazole (NEXIUM) 40 MG capsule TAKE 1 CAPSULE (40 MG TOTAL) BY MOUTH DAILY. 90 capsule 0  . cephALEXin (KEFLEX) 500 MG capsule Take 1 capsule (500 mg total) by mouth 3 (three) times daily. 15 capsule 0  . sildenafil (REVATIO) 20 MG tablet take 3-5 tablets by mouth three times a day or as directed 30 tablet 3   No facility-administered medications prior to visit.      Per HPI unless specifically indicated in ROS section below Review of Systems  Constitutional: Negative for activity change, appetite change, chills, fatigue, fever and  unexpected weight change.  HENT: Negative for hearing loss.   Eyes: Negative for visual disturbance.  Respiratory: Negative for cough, chest tightness, shortness of breath and wheezing.   Cardiovascular: Negative for chest pain, palpitations and leg swelling.  Gastrointestinal: Negative for abdominal distention, abdominal pain, blood in stool, constipation, diarrhea, nausea and vomiting.  Genitourinary: Negative for difficulty urinating and hematuria.  Musculoskeletal: Negative for arthralgias, myalgias and neck pain.  Skin: Negative for rash.  Neurological: Negative for dizziness, seizures, syncope and headaches.  Hematological: Negative for adenopathy. Does not bruise/bleed easily.  Psychiatric/Behavioral: Negative for dysphoric mood. The patient is not nervous/anxious.        Objective:    BP 118/80 (BP Location: Left Arm, Patient Position: Sitting, Cuff Size: Normal)   Pulse 64   Temp 97.9 F (36.6 C) (Oral)   Ht 6' 0.5" (1.842 m)   Wt 217 lb (98.4 kg)   SpO2 94%   BMI 29.03 kg/m   Wt Readings from Last 3 Encounters:  04/01/17 217 lb (98.4 kg)  05/22/16 220 lb (99.8 kg)  05/21/16 218 lb (98.9 kg)    Physical Exam  Constitutional: He is oriented to person, place, and time. He appears well-developed and well-nourished. No distress.  HENT:  Head: Normocephalic and atraumatic.  Right Ear: Hearing, tympanic membrane, external ear and ear canal normal.  Left Ear: Hearing, tympanic membrane, external ear and ear canal normal.  Nose: Nose normal.  Mouth/Throat: Uvula is midline, oropharynx is clear and moist and mucous membranes are normal. No oropharyngeal exudate, posterior oropharyngeal edema or posterior oropharyngeal erythema.  Eyes: Pupils are equal, round, and reactive to light. Conjunctivae and EOM are normal. No scleral icterus.  Neck: Normal range of motion. Neck supple. Carotid bruit is not present. No thyromegaly present.  Cardiovascular: Normal rate, regular rhythm,  normal heart sounds and intact distal pulses.   No murmur heard. Pulses:      Radial pulses are 2+ on the right side, and 2+ on the left side.  Pulmonary/Chest: Effort normal and breath sounds normal. No respiratory distress. He has no wheezes. He has no rales.  Abdominal: Soft. Bowel sounds are normal. He exhibits no distension and no mass. There is no tenderness. There is no rebound and no guarding.  Musculoskeletal: Normal range of motion. He exhibits no edema.  Lymphadenopathy:    He has no cervical adenopathy.  Neurological: He is alert and oriented to person, place, and time.  CN grossly intact, station and gait intact  Skin: Skin is warm and dry. No rash noted.  Psychiatric: He has a normal mood and affect. His behavior is normal. Judgment and thought content normal.  Nursing note and vitals reviewed.  Results for orders placed or performed in visit on 03/28/17  Comprehensive metabolic panel  Result Value Ref Range   Sodium 139 135 - 145 mEq/L   Potassium 4.7 3.5 - 5.1 mEq/L   Chloride 103 96 - 112 mEq/L   CO2 32 19 - 32 mEq/L   Glucose, Bld 103 (H) 70 - 99 mg/dL   BUN 19 6 - 23 mg/dL   Creatinine, Ser 1.12 0.40 - 1.50 mg/dL   Total Bilirubin 0.6 0.2 - 1.2 mg/dL   Alkaline Phosphatase 89 39 - 117 U/L   AST 19 0 - 37 U/L   ALT 19 0 - 53 U/L   Total Protein 6.5 6.0 - 8.3 g/dL   Albumin 3.8 3.5 - 5.2 g/dL   Calcium 8.5 8.4 - 10.5 mg/dL   GFR 69.95 >60.00 mL/min  Lipid panel  Result Value Ref Range   Cholesterol 183 0 - 200 mg/dL   Triglycerides 115.0 0.0 - 149.0 mg/dL   HDL 46.10 >39.00 mg/dL   VLDL 23.0 0.0 - 40.0 mg/dL   LDL Cholesterol 114 (H) 0 - 99 mg/dL   Total CHOL/HDL Ratio 4    NonHDL 136.66   Hemoglobin A1c  Result Value Ref Range   Hgb A1c MFr Bld 6.1 4.6 - 6.5 %  PSA  Result Value Ref Range   PSA 0.17 0.10 - 4.00 ng/mL      Assessment & Plan:   Problem List Items Addressed This Visit    ERECTILE DYSFUNCTION    Generic revatio refilled. Doing well  with this.       GASTROESOPHAGEAL REFLUX DISEASE    Chronic. Breakthrough sxs if 1 dose missed.      Healthcare maintenance - Primary    Preventative protocols reviewed and updated unless pt declined. Discussed healthy diet and lifestyle.       HLD (hyperlipidemia)    Chronic. Mild off meds.  The 10-year ASCVD risk score Mikey Bussing DC Jr., et al., 2013) is: 10.1%   Values used to calculate the score:     Age: 5 years     Sex: Male     Is Non-Hispanic African American: No     Diabetic: No  Tobacco smoker: No     Systolic Blood Pressure: 749 mmHg     Is BP treated: No     HDL Cholesterol: 46.1 mg/dL     Total Cholesterol: 183 mg/dL       Relevant Medications   sildenafil (REVATIO) 20 MG tablet   Prediabetes    Chronic, stable. Encouraged avoiding added sugars.        Other Visit Diagnoses    Special screening for malignant neoplasms, colon       Relevant Orders   Fecal occult blood, imunochemical       Follow up plan: Return in about 1 year (around 04/01/2018) for annual exam, prior fasting for blood work.  Ria Bush, MD

## 2017-04-01 NOTE — Assessment & Plan Note (Signed)
Generic revatio refilled. Doing well with this.

## 2017-06-22 ENCOUNTER — Other Ambulatory Visit: Payer: Self-pay | Admitting: Family Medicine

## 2017-09-23 ENCOUNTER — Ambulatory Visit (INDEPENDENT_AMBULATORY_CARE_PROVIDER_SITE_OTHER)
Admission: RE | Admit: 2017-09-23 | Discharge: 2017-09-23 | Disposition: A | Discharge status: Home / Self Care | Payer: Medicare HMO | Source: Ambulatory Visit | Attending: Primary Care | Admitting: Primary Care

## 2017-09-23 ENCOUNTER — Ambulatory Visit (INDEPENDENT_AMBULATORY_CARE_PROVIDER_SITE_OTHER): Payer: Medicare HMO | Admitting: Primary Care

## 2017-09-23 VITALS — BP 122/84 | HR 70 | Temp 99.3°F | Ht 72.5 in | Wt 219.2 lb

## 2017-09-23 DIAGNOSIS — R05 Cough: Secondary | ICD-10-CM | POA: Diagnosis not present

## 2017-09-23 DIAGNOSIS — J069 Acute upper respiratory infection, unspecified: Secondary | ICD-10-CM

## 2017-09-23 MED ORDER — GUAIFENESIN-CODEINE 100-10 MG/5ML PO SYRP: 5.0000 mL | ORAL_SOLUTION | Freq: Three times a day (TID) | ORAL | 0 refills | Status: DC | PRN

## 2017-09-23 NOTE — Progress Notes (Signed)
Subjective:    Patient ID: Mark Martinez, male    DOB: 11/12/1950, 67 y.o.   MRN: [id number]  HPI  Mark Martinez is a 67 year old male with a history of GERD, tobacco abuse (quit in 2010) who presents today with a chief complaint of cough.  He also reports sore throat, headache, low grade fevers, body aches. His symptoms began three days ago. His cough is mildly productive with "off color". His fevers are running in the 99 range. He denies sick contacts. He's been taking Tylenol, Mucinex, and OTC cough medication without much improvement. He's taken Codeine in the past which is effective for cough but will "keep me up all night". He's afraid that this will turn into bronchitis.   Review of Systems  Constitutional: Positive for fatigue and fever.  HENT: Positive for congestion and sore throat. Negative for ear pain and sinus pressure.   Respiratory: Positive for cough. Negative for shortness of breath and wheezing.   Cardiovascular: Negative for chest pain.  Neurological: Positive for headaches.       Past Medical History:  Diagnosis Date  . ED (erectile dysfunction)   . GERD (gastroesophageal reflux disease)    with hiatal hernia  . H/O hiatal hernia   . History of prostatitis   . HLD (hyperlipidemia)   . Prediabetes 07/28/2015  . Shortness of breath    exersion     Social History   Socioeconomic History  . Marital status: Married    Spouse name: Not on file  . Number of children: 3  . Years of education: Not on file  . Highest education level: Not on file  Occupational History  . Occupation: seeding and Investment banker, corporate: [employer]  . Financial resource strain: Not on file  . Food insecurity:    Worry: Not on file    Inability: Not on file  . Transportation needs:    Medical: Not on file    Non-medical: Not on file  Tobacco Use  . Smoking status: Never Smoker  . Smokeless tobacco: Former Network engineer and Sexual Activity  . Alcohol use:  No    Alcohol/week: 0.0 oz  . Drug use: No  . Sexual activity: Not on file  Lifestyle  . Physical activity:    Days per week: Not on file    Minutes per session: Not on file  . Stress: Not on file  Relationships  . Social connections:    Talks on phone: Not on file    Gets together: Not on file    Attends religious service: Not on file    Active member of club or organization: Not on file    Attends meetings of clubs or organizations: Not on file    Relationship status: Not on file  . Intimate partner violence:    Fear of current or ex partner: Not on file    Emotionally abused: Not on file    Physically abused: Not on file    Forced sexual activity: Not on file  Other Topics Concern  . Not on file  Social History Narrative   Caffeine: rare   Lives with wife, 1 dog.   Grown children   Occupation: Works in Biomedical scientist.   Activity: stays active at work   Diet: daily fruits/vegetables, some water    Past Surgical History:  Procedure Laterality Date  . BACK SURGERY  1994   lumbar area  . COLONOSCOPY  2005  WNL, rpt 10 yrs  . CYST REMOVAL NECK Left 08/31/2013   Procedure: EXCISION EPIDERMAL INCLUSION CYST LEFT NECK;  Surgeon: Zenovia Jarred, MD  . ESOPHAGEAL DILATION     three last  . ESOPHAGOGASTRODUODENOSCOPY  12-27-1996   hiatal hernia (Dr. Sharlett Iles)    Family History  Problem Relation Age of Onset  . Valvular heart disease Mother   . Ulcers Father   . CAD Father 84       multiple stents  . Cancer Neg Hx   . Diabetes Neg Hx   . Stroke Neg Hx   . CAD Paternal Uncle     Allergies  Allergen Reactions  . Codeine Other (See Comments)    Keeps him awake    Current Outpatient Medications on File Prior to Visit  Medication Sig Dispense Refill  . acetaminophen (TYLENOL) 325 MG tablet Take 650 mg by mouth every 6 (six) hours as needed for moderate pain.     Marland Kitchen esomeprazole (NEXIUM) 40 MG capsule TAKE 1 CAPSULE (40 MG TOTAL) BY MOUTH DAILY. 90 capsule 2  .  sildenafil (REVATIO) 20 MG tablet Take 2 tablets (40 mg total) by mouth daily as needed. 30 tablet 6   No current facility-administered medications on file prior to visit.     BP 122/84   Pulse 70   Temp 99.3 F (37.4 C) (Oral)   Ht 6' 0.5" (1.842 m)   Wt 219 lb 4 oz (99.5 kg)   SpO2 94%   BMI 29.33 kg/m    Objective:   Physical Exam  Constitutional: He appears well-nourished. He does not have a sickly appearance. He appears ill.  HENT:  Right Ear: Tympanic membrane and ear canal normal.  Left Ear: Tympanic membrane and ear canal normal.  Nose: No mucosal edema. Right sinus exhibits no maxillary sinus tenderness and no frontal sinus tenderness. Left sinus exhibits no maxillary sinus tenderness and no frontal sinus tenderness.  Mouth/Throat: Oropharynx is clear and moist.  Eyes: Conjunctivae are normal.  Neck: Neck supple.  Cardiovascular: Normal rate and regular rhythm.  Pulmonary/Chest: Effort normal. He has no decreased breath sounds. He has no wheezes. He has rhonchi in the right lower field. He has no rales.  Skin: Skin is warm.  Slightly clammy          Assessment & Plan:  URI:  Cough, congestion, body aches, headache x 3 days. Exam today with rhonchi to RLL, does appear ill but overall stable.  Check chest xray today given questionable lung sounds to RLL.  Do suspect viral involvement at this point as his symptoms are classic for viral infection. Discussed to continue Tylenol or switch to Ibuprofen for headaches/fevers.  Rx for Cheratussin sent to pharmacy to use TID PRN, this does not typically cause drowsiness. He denies allergy to codeine.  Fluids, rest, call Friday if no improvement.  Mark Koch, NP

## 2017-09-23 NOTE — Patient Instructions (Signed)
Complete xray(s) prior to leaving today. I will notify you of your results once received.  You may take the cough suppressant three times daily as needed for cough and rest. Caution this medication contains codeine and may make you feel drowsy.  Hold the Mucinex for now.  Continue Tylenol for fevers and headaches, okay to switch to Ibuprofen.   Please call if your fevers increase to 101 or if no improvement Friday this week.  It was a pleasure meeting you!   Upper Respiratory Infection, Adult Most upper respiratory infections (URIs) are caused by a virus. A URI affects the nose, throat, and upper air passages. The most common type of URI is often called "the common cold." Follow these instructions at home:  Take medicines only as told by your doctor.  Gargle warm saltwater or take cough drops to comfort your throat as told by your doctor.  Use a warm mist humidifier or inhale steam from a shower to increase air moisture. This may make it easier to breathe.  Drink enough fluid to keep your pee (urine) clear or pale yellow.  Eat soups and other clear broths.  Have a healthy diet.  Rest as needed.  Go back to work when your fever is gone or your doctor says it is okay. ? You may need to stay home longer to avoid giving your URI to others. ? You can also wear a face mask and wash your hands often to prevent spread of the virus.  Use your inhaler more if you have asthma.  Do not use any tobacco products, including cigarettes, chewing tobacco, or electronic cigarettes. If you need help quitting, ask your doctor. Contact a doctor if:  You are getting worse, not better.  Your symptoms are not helped by medicine.  You have chills.  You are getting more short of breath.  You have brown or red mucus.  You have yellow or brown discharge from your nose.  You have pain in your face, especially when you bend forward.  You have a fever.  You have puffy (swollen) neck  glands.  You have pain while swallowing.  You have white areas in the back of your throat. Get help right away if:  You have very bad or constant: ? Headache. ? Ear pain. ? Pain in your forehead, behind your eyes, and over your cheekbones (sinus pain). ? Chest pain.  You have long-lasting (chronic) lung disease and any of the following: ? Wheezing. ? Long-lasting cough. ? Coughing up blood. ? A change in your usual mucus.  You have a stiff neck.  You have changes in your: ? Vision. ? Hearing. ? Thinking. ? Mood. This information is not intended to replace advice given to you by your health care provider. Make sure you discuss any questions you have with your health care provider. Document Released: 11/21/2007 Document Revised: 02/05/2016 Document Reviewed: 09/09/2013 Elsevier Interactive Patient Education  2018 [employer].

## 2017-09-30 ENCOUNTER — Telehealth: Payer: Self-pay | Admitting: Family Medicine

## 2017-09-30 DIAGNOSIS — J069 Acute upper respiratory infection, unspecified: Secondary | ICD-10-CM

## 2017-09-30 NOTE — Telephone Encounter (Signed)
Per DPR, left detail message of Kate Clark's comments for patient to call back 

## 2017-09-30 NOTE — Telephone Encounter (Signed)
Copied from [place] [id number]. Topic: Quick Communication - See Telephone Encounter >> Sep 30, 2017 11:04 AM Hewitt Shorts wrote: CRM for notification. See Telephone encounter for: 09/30/17. Pt is calling stating that he was seen on last Monday and he is still coughing and now it has green color and would like to know if something can be called in   Best number [phone]   [facility] [place]

## 2017-09-30 NOTE — Telephone Encounter (Signed)
Pt returned a phone call, see telephone encounter below, call pt to advise

## 2017-09-30 NOTE — Telephone Encounter (Signed)
How is he feeling (better, worse, same)? Any fevers? Any sinus pressure?

## 2017-10-01 NOTE — Telephone Encounter (Addendum)
Per DPR, left detail message of Mark Martinez comments for patient to call back.  Called patient and left message regarding Mark Martinez comments below. When patient returns call, triage nurse may disclose and ask the questions below.

## 2017-10-02 MED ORDER — AZITHROMYCIN 250 MG PO TABS: ORAL_TABLET | ORAL | 0 refills | Status: DC

## 2017-10-02 NOTE — Telephone Encounter (Signed)
Tried to call patient. Could not leave message since voicemail box is full. Need to ask patient if he wanted to be seen today, we can schedule him at 4 pm with Dr Danise Mina.

## 2017-10-02 NOTE — Telephone Encounter (Signed)
Tried to call patient but voicemail is full  

## 2017-10-02 NOTE — Addendum Note (Signed)
Addended by: Pleas Koch on: 10/02/2017 12:56 PM   Modules accepted: Orders

## 2017-10-02 NOTE — Telephone Encounter (Signed)
Left message of voice mail to see if pt would like t be seen in the office today, and to make him aware that his information has been forwarded to Allie Bossier

## 2017-10-02 NOTE — Telephone Encounter (Addendum)
Pt states that he is coughing so much his back hurts, and he is coughing up light green sputum and no fever; the [facility] uses [facility] [place] [place]; spoke with Rena and she will route to provider

## 2017-10-02 NOTE — Telephone Encounter (Signed)
Attempted to call patient, mail box is full. Please notify patient to start Azithromycin antibiotics for infection. Take 2 tablets by mouth today, then 1 tablet daily for 4 additional days.

## 2017-10-02 NOTE — Telephone Encounter (Signed)
Patient returned call, he says he is coughing so much his back hurts, coughing up light green sputum. He says he is out of the Robitussin [facility], but it did not help his cough. He says he's no better than we he came in last week and he cannot miss any more days of work this week to come to the office. He says he would like something called in for his cough and congestion, he is not running fevers. I advised this would be sent to [facility] and he will receive a call with her recommendations, he verbalized understanding.

## 2017-12-04 ENCOUNTER — Other Ambulatory Visit: Payer: Self-pay | Admitting: Family Medicine

## 2018-01-20 ENCOUNTER — Other Ambulatory Visit: Payer: Self-pay | Admitting: Family Medicine

## 2018-02-17 DIAGNOSIS — J209 Acute bronchitis, unspecified: Secondary | ICD-10-CM | POA: Diagnosis not present

## 2018-02-18 DIAGNOSIS — H2513 Age-related nuclear cataract, bilateral: Secondary | ICD-10-CM | POA: Diagnosis not present

## 2018-02-18 DIAGNOSIS — H01001 Unspecified blepharitis right upper eyelid: Secondary | ICD-10-CM | POA: Diagnosis not present

## 2018-02-18 DIAGNOSIS — H01004 Unspecified blepharitis left upper eyelid: Secondary | ICD-10-CM | POA: Diagnosis not present

## 2018-02-18 DIAGNOSIS — H01002 Unspecified blepharitis right lower eyelid: Secondary | ICD-10-CM | POA: Diagnosis not present

## 2018-03-19 ENCOUNTER — Encounter: Payer: Self-pay | Admitting: Family Medicine

## 2018-03-19 ENCOUNTER — Ambulatory Visit (INDEPENDENT_AMBULATORY_CARE_PROVIDER_SITE_OTHER): Payer: Medicare HMO | Admitting: Family Medicine

## 2018-03-19 VITALS — BP 120/80 | HR 66 | Temp 97.7°F | Ht 72.5 in | Wt 222.0 lb

## 2018-03-19 DIAGNOSIS — L02212 Cutaneous abscess of back [any part, except buttock]: Secondary | ICD-10-CM

## 2018-03-19 NOTE — Patient Instructions (Signed)
Can carefully remove dressing after 24 hours, do not get wet Try not to disturb the packing material Return in 2 days for wound check Apply warm compresses to area 3-4 times a day- place warm, wet cloth in zippered bag and apply for 10 mintues   Incision and Drainage, Care After Refer to this sheet in the next few weeks. These instructions provide you with information about caring for yourself after your procedure. Your health care provider may also give you more specific instructions. Your treatment has been planned according to current medical practices, but problems sometimes occur. Call your health care provider if you have any problems or questions after your procedure. What can I expect after the procedure? After the procedure, it is common to have:  Pain or discomfort around your incision site.  Drainage from your incision.  Follow these instructions at home:  Take over-the-counter and prescription medicines only as told by your health care provider.  If you were prescribed an antibiotic medicine, take it as told by your health care provider.Do not stop taking the antibiotic even if you start to feel better.  Followinstructions from your health care provider about: ? How to take care of your incision. ? When and how you should change your packing and bandage (dressing). Wash your hands with soap and water before you change your dressing. If soap and water are not available, use hand sanitizer. ? When you should remove your dressing.  Do not take baths, swim, or use a hot tub until your health care provider approves.  Keep all follow-up visits as told by your health care provider. This is important.  Check your incision area every day for signs of infection. Check for: ? More redness, swelling, or pain. ? More fluid or blood. ? Warmth. ? Pus or a bad smell. Contact a health care provider if:  Your cyst or abscess returns.  You have a fever.  You have more redness,  swelling, or pain around your incision.  You have more fluid or blood coming from your incision.  Your incision feels warm to the touch.  You have pus or a bad smell coming from your incision. Get help right away if:  You have severe pain or bleeding.  You cannot eat or drink without vomiting.  You have decreased urine output.  You become short of breath.  You have chest pain.  You cough up blood.  The area where the incision and drainage occurred becomes numb or it tingles. This information is not intended to replace advice given to you by your health care provider. Make sure you discuss any questions you have with your health care provider. Document Released: 08/27/2011 Document Revised: 11/04/2015 Document Reviewed: 03/25/2015 Elsevier Interactive Patient Education  [employer].

## 2018-03-19 NOTE — Progress Notes (Signed)
   Subjective:    Patient ID: Mark Martinez, male    DOB: July 21, 1950, 67 y.o.   MRN: [id number]  HPI This is a 67 yo male who presents today with recurrent cyst on his left upper back. Had infected cyst that was removed a couple of years ago. Has done well until last couple of days when he noticed swelling and pain. No systemic symptoms such as fever/chills, myalgias.   Past Medical History:  Diagnosis Date  . ED (erectile dysfunction)   . GERD (gastroesophageal reflux disease)    with hiatal hernia  . H/O hiatal hernia   . History of prostatitis   . HLD (hyperlipidemia)   . Prediabetes 07/28/2015  . Shortness of breath    exersion   Past Surgical History:  Procedure Laterality Date  . BACK SURGERY  1994   lumbar area  . COLONOSCOPY  2005   WNL, rpt 10 yrs  . CYST REMOVAL NECK Left 08/31/2013   Procedure: EXCISION EPIDERMAL INCLUSION CYST LEFT NECK;  Surgeon: Zenovia Jarred, MD  . ESOPHAGEAL DILATION     three last  . ESOPHAGOGASTRODUODENOSCOPY  12-27-1996   hiatal hernia (Dr. Sharlett Iles)   Family History  Problem Relation Age of Onset  . Valvular heart disease Mother   . Ulcers Father   . CAD Father 28       multiple stents  . Cancer Neg Hx   . Diabetes Neg Hx   . Stroke Neg Hx   . CAD Paternal Uncle    Social History   Tobacco Use  . Smoking status: Never Smoker  . Smokeless tobacco: Former Network engineer Use Topics  . Alcohol use: No    Alcohol/week: 0.0 standard drinks  . Drug use: No      Review of Systems Per HPI    Objective:   Physical Exam  Constitutional: He is oriented to person, place, and time. He appears well-developed and well-nourished. No distress.  HENT:  Head: Normocephalic and atraumatic.  Eyes: Conjunctivae are normal.  Neck: Normal range of motion. Neck supple.  Cardiovascular: Normal rate.  Pulmonary/Chest: Effort normal.  Neurological: He is alert and oriented to person, place, and time.  Skin: Skin is warm and dry. He is not  diaphoretic.     Vitals reviewed.     Blood pressure 120/80, pulse 66, temperature 97.7 F (36.5 C), temperature source Oral, height 6' 0.5" (1.842 m), weight 222 lb (100.7 kg), SpO2 97 %.  Wt Readings from Last 3 Encounters:  03/19/18 222 lb (100.7 kg)  09/23/17 219 lb 4 oz (99.5 kg)  04/01/17 217 lb (98.4 kg)   I & D- VCO, allergies reviewed, area cleaned with betadine/alcohol, Lidocaine 2% with epi used to obtain adequate local anesthesia. No. 11 blade used to make adequate incision. Moderate amount thick white matter expressed, wound explored. Packing 1/4 inch inserted. Dressing applied.     Assessment & Plan:  1. Abscess of back - Provided written and verbal information regarding diagnosis and treatment. - RTC in 2 days for packing removal, wound check - See AVS for specific instructions  Clarene Reamer, FNP-BC  [facility], [place]  03/19/2018 5:32 PM

## 2018-03-21 ENCOUNTER — Ambulatory Visit (INDEPENDENT_AMBULATORY_CARE_PROVIDER_SITE_OTHER): Payer: Medicare HMO | Admitting: Family Medicine

## 2018-03-21 ENCOUNTER — Encounter: Payer: Self-pay | Admitting: Family Medicine

## 2018-03-21 VITALS — BP 116/80 | HR 70 | Ht 72.5 in | Wt 222.0 lb

## 2018-03-21 DIAGNOSIS — Z23 Encounter for immunization: Secondary | ICD-10-CM

## 2018-03-21 DIAGNOSIS — L02212 Cutaneous abscess of back [any part, except buttock]: Secondary | ICD-10-CM

## 2018-03-21 NOTE — Progress Notes (Signed)
   Subjective:    Patient ID: Mark Martinez, male    DOB: 1950-09-06, 67 y.o.   MRN: [id number]  HPI This is a 67 yo male who presents today for follow up of I&D done two days ago. He has not had any pain or fever. Wife helped him with dressing change.   Past Medical History:  Diagnosis Date  . ED (erectile dysfunction)   . GERD (gastroesophageal reflux disease)    with hiatal hernia  . H/O hiatal hernia   . History of prostatitis   . HLD (hyperlipidemia)   . Prediabetes 07/28/2015  . Shortness of breath    exersion   Past Surgical History:  Procedure Laterality Date  . BACK SURGERY  1994   lumbar area  . COLONOSCOPY  2005   WNL, rpt 10 yrs  . CYST REMOVAL NECK Left 08/31/2013   Procedure: EXCISION EPIDERMAL INCLUSION CYST LEFT NECK;  Surgeon: Zenovia Jarred, MD  . ESOPHAGEAL DILATION     three last  . ESOPHAGOGASTRODUODENOSCOPY  12-27-1996   hiatal hernia (Dr. Sharlett Iles)   Family History  Problem Relation Age of Onset  . Valvular heart disease Mother   . Ulcers Father   . CAD Father 73       multiple stents  . Cancer Neg Hx   . Diabetes Neg Hx   . Stroke Neg Hx   . CAD Paternal Uncle    Social History   Tobacco Use  . Smoking status: Never Smoker  . Smokeless tobacco: Former Network engineer Use Topics  . Alcohol use: No    Alcohol/week: 0.0 standard drinks  . Drug use: No      Review of Systems Per HPI    Objective:   Physical Exam  Constitutional: He is oriented to person, place, and time. He appears well-developed and well-nourished. No distress.  HENT:  Head: Normocephalic and atraumatic.  Eyes: Conjunctivae are normal.  Cardiovascular: Normal rate.  Pulmonary/Chest: Effort normal.  Neurological: He is alert and oriented to person, place, and time.  Skin: Skin is warm and dry. He is not diaphoretic.  Packing removed from left upper back wound. Small amount pustular, serous drainage expressed. Little surrounding erythema. Irrigated with 5 cc  lidocaine. 1/4 inch packing placed. Dressing applied.   Psychiatric: He has a normal mood and affect. His behavior is normal. Judgment and thought content normal.  Vitals reviewed.    BP 116/80 (BP Location: Left Arm, Patient Position: Sitting, Cuff Size: Large)   Pulse 70   Ht 6' 0.5" (1.842 m)   Wt 222 lb (100.7 kg)   SpO2 94%   BMI 29.69 kg/m  Wt Readings from Last 3 Encounters:  03/21/18 222 lb (100.7 kg)  03/19/18 222 lb (100.7 kg)  09/23/17 219 lb 4 oz (99.5 kg)        Assessment & Plan:  1. Abscess of back - improved -  Patient Instructions  Abscess looks good today  Keep dressing on for 24 hours then change  Do warm compresses 3-4 times a day  In two days, remove the packing. Wash gently with soap and water. Keep covered while wound is open.  Follow up if you develop fever, increased swelling or pain    2. Need for influenza vaccination - Flu vaccine HIGH DOSE PF   Clarene Reamer, FNP-BC  [facility]  03/21/2018 8:46 AM

## 2018-03-21 NOTE — Patient Instructions (Signed)
Abscess looks good today  Keep dressing on for 24 hours then change  Do warm compresses 3-4 times a day  In two days, remove the packing. Wash gently with soap and water. Keep covered while wound is open.  Follow up if you develop fever, increased swelling or pain

## 2018-04-07 ENCOUNTER — Other Ambulatory Visit: Payer: Self-pay | Admitting: Family Medicine

## 2018-04-07 ENCOUNTER — Ambulatory Visit (INDEPENDENT_AMBULATORY_CARE_PROVIDER_SITE_OTHER): Payer: Medicare HMO | Admitting: Family Medicine

## 2018-04-07 VITALS — BP 108/68 | HR 106 | Temp 97.8°F | Ht 72.5 in | Wt 218.8 lb

## 2018-04-07 DIAGNOSIS — J069 Acute upper respiratory infection, unspecified: Secondary | ICD-10-CM | POA: Diagnosis not present

## 2018-04-07 DIAGNOSIS — R7303 Prediabetes: Secondary | ICD-10-CM

## 2018-04-07 DIAGNOSIS — E785 Hyperlipidemia, unspecified: Secondary | ICD-10-CM

## 2018-04-07 DIAGNOSIS — Z125 Encounter for screening for malignant neoplasm of prostate: Secondary | ICD-10-CM

## 2018-04-07 MED ORDER — BENZONATATE 100 MG PO CAPS: 100.0000 mg | ORAL_CAPSULE | Freq: Three times a day (TID) | ORAL | 0 refills | Status: DC | PRN

## 2018-04-07 NOTE — Progress Notes (Signed)
Subjective:    Patient ID: Mark Martinez, male    DOB: [birth year], 67 y.o.   MRN: [id number]  HPI This is a 67 yo male who presents today with off and on sinus problems x 2 months. Started in his chest with bronchitis, had antibiotic (Zpack) with clearing of sputum color. Over last week, head has had stuffy, runny nose. Cough is productive of green sputum in the morning that turns to white as day progresses.  Took some benadryl with good sleep, decreased nasal congestion. Doesn't feel like chest is congested. No wheezing.  No sore throat, no ear pain. No fever. Felt hot a couple of days ago.  Coughing spells seem to occur after eating.   He has lost a few pounds and states he has been watching his diet.   Past Medical History:  Diagnosis Date  . ED (erectile dysfunction)   . GERD (gastroesophageal reflux disease)    with hiatal hernia  . H/O hiatal hernia   . History of prostatitis   . HLD (hyperlipidemia)   . Prediabetes 07/28/2015  . Shortness of breath    exersion   Past Surgical History:  Procedure Laterality Date  . BACK SURGERY  1994   lumbar area  . COLONOSCOPY  2005   WNL, rpt 10 yrs  . CYST REMOVAL NECK Left 08/31/2013   Procedure: EXCISION EPIDERMAL INCLUSION CYST LEFT NECK;  Surgeon: Zenovia Jarred, MD  . ESOPHAGEAL DILATION     three last  . ESOPHAGOGASTRODUODENOSCOPY  12-27-1996   hiatal hernia (Dr. Sharlett Iles)   Family History  Problem Relation Age of Onset  . Valvular heart disease Mother   . Ulcers Father   . CAD Father 58       multiple stents  . Cancer Neg Hx   . Diabetes Neg Hx   . Stroke Neg Hx   . CAD Paternal Uncle    Social History   Tobacco Use  . Smoking status: Never Smoker  . Smokeless tobacco: Former Network engineer Use Topics  . Alcohol use: No    Alcohol/week: 0.0 standard drinks  . Drug use: No      Review of Systems Per HPI    Objective:   Physical Exam  Constitutional: He is oriented to person, place, and time. He  appears well-developed and well-nourished. No distress.  HENT:  Head: Normocephalic and atraumatic.  Right Ear: Tympanic membrane, external ear and ear canal normal.  Left Ear: Tympanic membrane, external ear and ear canal normal.  Nose: Mucosal edema and rhinorrhea present. Right sinus exhibits no maxillary sinus tenderness and no frontal sinus tenderness. Left sinus exhibits no maxillary sinus tenderness and no frontal sinus tenderness.  Mouth/Throat: Uvula is midline and mucous membranes are normal. Posterior oropharyngeal erythema (mild with post nasal drainage) present. No oropharyngeal exudate or posterior oropharyngeal edema.  Eyes: Conjunctivae are normal.  Neck: Normal range of motion. Neck supple.  Cardiovascular: Normal rate, regular rhythm and normal heart sounds.  HR 98 on auscultation.   Pulmonary/Chest: Effort normal and breath sounds normal.  Musculoskeletal: He exhibits no edema.  Lymphadenopathy:    He has no cervical adenopathy.  Neurological: He is alert and oriented to person, place, and time.  Skin: Skin is warm and dry. He is not diaphoretic.  Psychiatric: He has a normal mood and affect. His behavior is normal. Judgment and thought content normal.  Vitals reviewed.     BP 108/68 (BP Location: Right Arm, Patient Position: Sitting,  Cuff Size: Normal)   Pulse (!) 106   Temp 97.8 F (36.6 C) (Oral)   Ht 6' 0.5" (1.842 m)   Wt 218 lb 12.8 oz (99.2 kg)   SpO2 96%   BMI 29.27 kg/m  Wt Readings from Last 3 Encounters:  04/07/18 218 lb 12.8 oz (99.2 kg)  03/21/18 222 lb (100.7 kg)  03/19/18 222 lb (100.7 kg)       Assessment & Plan:  1. URI with cough and congestion - suspect he has a new viral illness and continued cough from prior bronchitis - Provided written and verbal information regarding diagnosis and treatment. - encouraged him to increase fluids, add Mucinex, will add benzonate for cough - RTC precautions reviewed - benzonatate (TESSALON) 100 MG  capsule; Take 1-2 capsules (100-200 mg total) by mouth 3 (three) times daily as needed.  Dispense: 30 capsule; Refill: 0   Clarene Reamer, FNP-BC  [facility]  04/08/2018 8:11 AM

## 2018-04-07 NOTE — Patient Instructions (Addendum)
Good to see you today  Take over the counter Mucinex (plain, generic is fine) to thin your sputum as needed Drink enough water to make your urine light yellow  Let me know if you are not better at the end of the week

## 2018-04-08 ENCOUNTER — Encounter: Payer: Self-pay | Admitting: Family Medicine

## 2018-04-08 ENCOUNTER — Other Ambulatory Visit: Payer: Medicare HMO

## 2018-04-11 ENCOUNTER — Other Ambulatory Visit (INDEPENDENT_AMBULATORY_CARE_PROVIDER_SITE_OTHER): Payer: Medicare HMO

## 2018-04-11 DIAGNOSIS — R7303 Prediabetes: Secondary | ICD-10-CM | POA: Diagnosis not present

## 2018-04-11 DIAGNOSIS — E785 Hyperlipidemia, unspecified: Secondary | ICD-10-CM | POA: Diagnosis not present

## 2018-04-11 DIAGNOSIS — Z125 Encounter for screening for malignant neoplasm of prostate: Secondary | ICD-10-CM | POA: Diagnosis not present

## 2018-04-11 LAB — COMPREHENSIVE METABOLIC PANEL
ALT: 29 U/L (ref 0–53)
AST: 20 U/L (ref 0–37)
Albumin: 4.2 g/dL (ref 3.5–5.2)
Alkaline Phosphatase: 94 U/L (ref 39–117)
BILIRUBIN TOTAL: 0.6 mg/dL (ref 0.2–1.2)
BUN: 16 mg/dL (ref 6–23)
CO2: 25 meq/L (ref 19–32)
Calcium: 9.3 mg/dL (ref 8.4–10.5)
Chloride: 103 mEq/L (ref 96–112)
Creatinine, Ser: 1.1 mg/dL (ref 0.40–1.50)
GFR: 71.19 mL/min (ref >60.00)
GLUCOSE: 107 mg/dL — AB (ref 70–99)
Potassium: 4.2 mEq/L (ref 3.5–5.1)
Sodium: 139 mEq/L (ref 135–145)
Total Protein: 7 g/dL (ref 6.0–8.3)

## 2018-04-11 LAB — LIPID PANEL
Cholesterol: 200 mg/dL (ref 0–200)
HDL: 46.2 mg/dL (ref >39.00)
LDL CALC: 124 mg/dL — AB (ref 0–99)
NONHDL: 154.18
Total CHOL/HDL Ratio: 4
Triglycerides: 151 mg/dL — ABNORMAL HIGH (ref 0.0–149.0)
VLDL: 30.2 mg/dL (ref 0.0–40.0)

## 2018-04-11 LAB — HEMOGLOBIN A1C: HEMOGLOBIN A1C: 6 % (ref 4.6–6.5)

## 2018-04-11 LAB — PSA: PSA: 0.13 ng/mL (ref 0.10–4.00)

## 2018-04-14 ENCOUNTER — Other Ambulatory Visit (INDEPENDENT_AMBULATORY_CARE_PROVIDER_SITE_OTHER): Payer: Medicare HMO

## 2018-04-14 ENCOUNTER — Other Ambulatory Visit: Payer: Self-pay | Admitting: Family Medicine

## 2018-04-14 DIAGNOSIS — Z0000 Encounter for general adult medical examination without abnormal findings: Secondary | ICD-10-CM | POA: Insufficient documentation

## 2018-04-14 DIAGNOSIS — Z1211 Encounter for screening for malignant neoplasm of colon: Secondary | ICD-10-CM

## 2018-04-14 LAB — FECAL OCCULT BLOOD, IMMUNOCHEMICAL: Fecal Occult Bld: NEGATIVE

## 2018-04-14 LAB — FECAL OCCULT BLOOD, GUAIAC
FECAL OCCULT BLD: NEGATIVE
FECAL OCCULT BLD: NEGATIVE

## 2018-04-14 NOTE — Assessment & Plan Note (Signed)

## 2018-04-14 NOTE — Progress Notes (Signed)
BP 126/78 (BP Location: Left Arm, Patient Position: Sitting, Cuff Size: Normal)   Pulse 65   Temp 97.8 F (36.6 C) (Oral)   Ht 6' 0.75" (1.848 m)   Wt 218 lb 12 oz (99.2 kg)   SpO2 95%   BMI 29.06 kg/m    CC: welcome to medicare Subjective:    Patient ID: Mark Martinez, male    DOB: December 22, 1950, 67 y.o.   MRN: [id number]  HPI: Mark VUOLO is a 67 y.o. male presenting on 04/15/2018 for Welcome to Medicare and Cough (C/o cough with clear mucous.)   2 mo h/o cough. Initially seen at [facility], treated with prednisone and antibiotic course (zpack). Chest congestion has improved. Ongoing hoarse voice with prolonged voice use. No ST or PNdrainage. No fevers. Persistent productive cough. Seen last week with dx viral URI - treated with tessalon perls. He takes mucinex without benefit. Noticing increasing head congestion > chest congestion over last 2 weeks.   Depression screen - passed Fall risk - passed Hearing - passed - ongoing tinnitus.  Vision - passed  Hearing Screening   125Hz  250Hz  500Hz  1000Hz  2000Hz  3000Hz  4000Hz  6000Hz  8000Hz   Right ear:   25 40 20  0    Left ear:   20 20 20   0    Comments: Pt has tinnitus in bilateral ears.   Visual Acuity Screening   Right eye Left eye Both eyes  Without correction: 20/20 20/20 20/15   With correction:        AAA screen - not eligible.   Preventative: Colonoscopy - 2005, WNL rec rpt 10 years. iFOB negative 03/2018.  Prostate - all normal checks in the past. Would like PSA but declines DRE.  Flu shot yearly Tdap 2014  prevnar - due Shingrix - discussed Advanced directive discussion  Seatbelt use discussed.  Sunscreen use discussed. No changing moles on skin. Non smoker  Alcohol - none Dentist q6 mo  Eye exam yearly  Caffeine: rare  Lives with wife, 1 dog.  Grown children  Occupation: Works in Biomedical scientist  Activity: stays active at work  Diet: daily fruits/vegetables, some water, decreased sweets   Relevant past  medical, surgical, family and social history reviewed and updated as indicated. Interim medical history since our last visit reviewed. Allergies and medications reviewed and updated. Outpatient Medications Prior to Visit  Medication Sig Dispense Refill  . acetaminophen (TYLENOL) 325 MG tablet Take 650 mg by mouth every 6 (six) hours as needed for moderate pain.     Marland Kitchen esomeprazole (NEXIUM) 40 MG capsule TAKE 1 CAPSULE (40 MG TOTAL) BY MOUTH DAILY. 90 capsule 0  . sildenafil (REVATIO) 20 MG tablet TAKE 2 TABLETS BY MOUTH EVERY DAY IF NEEDED 30 tablet 3  . benzonatate (TESSALON) 100 MG capsule Take 1-2 capsules (100-200 mg total) by mouth 3 (three) times daily as needed. 30 capsule 0   No facility-administered medications prior to visit.      Per HPI unless specifically indicated in ROS section below Review of Systems     Objective:    BP 126/78 (BP Location: Left Arm, Patient Position: Sitting, Cuff Size: Normal)   Pulse 65   Temp 97.8 F (36.6 C) (Oral)   Ht 6' 0.75" (1.848 m)   Wt 218 lb 12 oz (99.2 kg)   SpO2 95%   BMI 29.06 kg/m   Wt Readings from Last 3 Encounters:  04/15/18 218 lb 12 oz (99.2 kg)  04/07/18 218 lb 12.8 oz (99.2  kg)  03/21/18 222 lb (100.7 kg)    Physical Exam  Constitutional: He is oriented to person, place, and time. He appears well-developed and well-nourished. No distress.  HENT:  Head: Normocephalic and atraumatic.  Right Ear: Hearing, tympanic membrane, external ear and ear canal normal.  Left Ear: Hearing, tympanic membrane, external ear and ear canal normal.  Nose: Nose normal. No mucosal edema or rhinorrhea. Right sinus exhibits no maxillary sinus tenderness and no frontal sinus tenderness. Left sinus exhibits no maxillary sinus tenderness and no frontal sinus tenderness.  Mouth/Throat: Uvula is midline, oropharynx is clear and moist and mucous membranes are normal. No oropharyngeal exudate, posterior oropharyngeal edema, posterior oropharyngeal  erythema or tonsillar abscesses.  Eyes: Pupils are equal, round, and reactive to light. Conjunctivae and EOM are normal. No scleral icterus.  Neck: Normal range of motion. Neck supple. Carotid bruit is not present. No thyromegaly present.  Cardiovascular: Normal rate, regular rhythm, normal heart sounds and intact distal pulses.  No murmur heard. Pulses:      Radial pulses are 2+ on the right side, and 2+ on the left side.  Pulmonary/Chest: Effort normal. No respiratory distress. He has no wheezes. He has rhonchi (RML) in the right middle field. He has no rales.  Deep cough present  Abdominal: Soft. Bowel sounds are normal. He exhibits no distension and no mass. There is no tenderness. There is no rebound and no guarding.  Musculoskeletal: Normal range of motion. He exhibits no edema.  Lymphadenopathy:    He has no cervical adenopathy.  Neurological: He is alert and oriented to person, place, and time.  CN grossly intact, station and gait intact Recall 3/3 Calculation 5/5 serial 7s  Skin: Skin is warm and dry. No rash noted.  Psychiatric: He has a normal mood and affect. His behavior is normal. Judgment and thought content normal.  Nursing note and vitals reviewed.  Results for orders placed or performed in visit on 04/15/18  Fecal Occult Blood, Guaiac  Result Value Ref Range   Fecal Occult Blood Negative   Fecal Occult Blood, Guaiac  Result Value Ref Range   Fecal Occult Blood Negative    EKG - NSR rate 60s, normal axis, intervals, no acute ST/T changes, some T wave flattening inferiorly new from prior EKG 08/2013    Assessment & Plan:   Problem List Items Addressed This Visit    Welcome to Medicare preventive visit - Primary    I have personally reviewed the Medicare Annual Wellness questionnaire and have noted 1. The patient's medical and social history 2. Their use of alcohol, tobacco or illicit drugs 3. Their current medications and supplements 4. The patient's functional  ability including ADL's, fall risks, home safety risks and hearing or visual impairment. Cognitive function has been assessed and addressed as indicated.  5. Diet and physical activity 6. Evidence for depression or mood disorders The patients weight, height, BMI have been recorded in the chart. I have made referrals, counseling and provided education to the patient based on review of the above and I have provided the pt with a written personalized care plan for preventive services. Provider list updated.. See scanned questionairre as needed for further documentation. Reviewed preventative protocols and updated unless pt declined.       Relevant Orders   EKG 12-Lead (Completed)   Prediabetes    Reviewed with patient.       Persistent cough    Persistent cough over the last 2 months started as URI  treated by [facility] with zpack and prednisone course with good improvement. Recurrent and worsening sinus congestion over last 2 wks, RML rhonchi heard on exam today - will check CXR today - clear on my read. Will treat as residual bronchial inflammation with prednisone taper sent to pharmacy. Update if recurrent or worsening symptoms despite treatment, would consider rpt abx course.       Relevant Orders   DG Chest 2 View   HLD (hyperlipidemia)    Chronic, mild increase noted. Off meds. The 10-year ASCVD risk score Mikey Bussing DC Brooke Bonito., et al., 2013) is: 13%   Values used to calculate the score:     Age: 40 years     Sex: Male     Is Non-Hispanic African American: No     Diabetic: No     Tobacco smoker: No     Systolic Blood Pressure: 130 mmHg     Is BP treated: No     HDL Cholesterol: 46.2 mg/dL     Total Cholesterol: 200 mg/dL       Relevant Medications   sildenafil (REVATIO) 20 MG tablet   GERD (gastroesophageal reflux disease)    Continue nexium 40mg  daily.       Relevant Medications   esomeprazole (NEXIUM) 40 MG capsule   ERECTILE DYSFUNCTION    Refill generic sildenafil - effective            Meds ordered this encounter  Medications  . esomeprazole (NEXIUM) 40 MG capsule    Sig: TAKE 1 CAPSULE (40 MG TOTAL) BY MOUTH DAILY.    Dispense:  90 capsule    Refill:  3  . sildenafil (REVATIO) 20 MG tablet    Sig: Take 2-4 tablets (40-80 mg total) by mouth 3 (three) times daily.    Dispense:  30 tablet    Refill:  6  . predniSONE (DELTASONE) 20 MG tablet    Sig: Take two tablets daily for 3 days followed by one tablet daily for 3 days    Dispense:  9 tablet    Refill:  0   Orders Placed This Encounter  Procedures  . DG Chest 2 View    Standing Status:   Future    Number of Occurrences:   1    Standing Expiration Date:   06/16/2019    Order Specific Question:   Reason for Exam (SYMPTOM  OR DIAGNOSIS REQUIRED)    Answer:   RML rhonchi with prolonged cough    Order Specific Question:   Preferred imaging location?    Answer:   [facility]    Order Specific Question:   Radiology Contrast Protocol - do NOT remove file path    Answer:   [email or web address]  . EKG 12-Lead    Follow up plan: Return in about 1 year (around 04/16/2019) for annual exam, prior fasting for blood work, medicare wellness visit.  Ria Bush, MD

## 2018-04-15 ENCOUNTER — Encounter: Payer: Self-pay | Admitting: Family Medicine

## 2018-04-15 ENCOUNTER — Ambulatory Visit (INDEPENDENT_AMBULATORY_CARE_PROVIDER_SITE_OTHER)
Admission: RE | Admit: 2018-04-15 | Discharge: 2018-04-15 | Disposition: A | Discharge status: Home / Self Care | Payer: Medicare HMO | Source: Ambulatory Visit | Attending: Family Medicine | Admitting: Family Medicine

## 2018-04-15 ENCOUNTER — Ambulatory Visit (INDEPENDENT_AMBULATORY_CARE_PROVIDER_SITE_OTHER): Payer: Medicare HMO | Admitting: Family Medicine

## 2018-04-15 VITALS — BP 126/78 | HR 65 | Temp 97.8°F | Ht 72.75 in | Wt 218.8 lb

## 2018-04-15 DIAGNOSIS — Z0000 Encounter for general adult medical examination without abnormal findings: Secondary | ICD-10-CM | POA: Diagnosis not present

## 2018-04-15 DIAGNOSIS — R059 Cough, unspecified: Secondary | ICD-10-CM

## 2018-04-15 DIAGNOSIS — R05 Cough: Secondary | ICD-10-CM

## 2018-04-15 DIAGNOSIS — F528 Other sexual dysfunction not due to a substance or known physiological condition: Secondary | ICD-10-CM | POA: Diagnosis not present

## 2018-04-15 DIAGNOSIS — E785 Hyperlipidemia, unspecified: Secondary | ICD-10-CM | POA: Diagnosis not present

## 2018-04-15 DIAGNOSIS — R7303 Prediabetes: Secondary | ICD-10-CM

## 2018-04-15 DIAGNOSIS — K219 Gastro-esophageal reflux disease without esophagitis: Secondary | ICD-10-CM

## 2018-04-15 DIAGNOSIS — Z7189 Other specified counseling: Secondary | ICD-10-CM | POA: Insufficient documentation

## 2018-04-15 DIAGNOSIS — R053 Chronic cough: Secondary | ICD-10-CM

## 2018-04-15 MED ORDER — ESOMEPRAZOLE MAGNESIUM 40 MG PO CPDR: DELAYED_RELEASE_CAPSULE | ORAL | 3 refills | Status: DC

## 2018-04-15 MED ORDER — PREDNISONE 20 MG PO TABS: ORAL_TABLET | ORAL | 0 refills | Status: DC

## 2018-04-15 MED ORDER — SILDENAFIL CITRATE 20 MG PO TABS: 40.0000 mg | ORAL_TABLET | Freq: Three times a day (TID) | ORAL | 6 refills | Status: DC

## 2018-04-15 NOTE — Assessment & Plan Note (Addendum)
Persistent cough over the last 2 months started as URI treated by [facility] with zpack and prednisone course with good improvement. Recurrent and worsening sinus congestion over last 2 wks, RML rhonchi heard on exam today - will check CXR today - clear on my read. Will treat as residual bronchial inflammation with prednisone taper sent to pharmacy. Update if recurrent or worsening symptoms despite treatment, would consider rpt abx course.

## 2018-04-15 NOTE — Patient Instructions (Addendum)
You are doing well today We will watch cholesterol levels.  Xray today. For persistent cough treat with another prednisone course for residual lung inflammation. Update me if new or persistent symptoms.   Health Maintenance, Male A healthy lifestyle and preventive care is important for your health and wellness. Ask your health care provider about what schedule of regular examinations is right for you. What should I know about weight and diet? Eat a Healthy Diet  Eat plenty of vegetables, fruits, whole grains, low-fat dairy products, and lean protein.  Do not eat a lot of foods high in solid fats, added sugars, or salt.  Maintain a Healthy Weight Regular exercise can help you achieve or maintain a healthy weight. You should:  Do at least 150 minutes of exercise each week. The exercise should increase your heart rate and make you sweat (moderate-intensity exercise).  Do strength-training exercises at least twice a week.  Watch Your Levels of Cholesterol and Blood Lipids  Have your blood tested for lipids and cholesterol every 5 years starting at 66 years of age. If you are at high risk for heart disease, you should start having your blood tested when you are 66 years old. You may need to have your cholesterol levels checked more often if: ? Your lipid or cholesterol levels are high. ? You are older than 66 years of age. ? You are at high risk for heart disease.  What should I know about cancer screening? Many types of cancers can be detected early and may often be prevented. Lung Cancer  You should be screened every year for lung cancer if: ? You are a current smoker who has smoked for at least 30 years. ? You are a former smoker who has quit within the past 15 years.  Talk to your health care provider about your screening options, when you should start screening, and how often you should be screened.  Colorectal Cancer  Routine colorectal cancer screening usually begins at 66  years of age and should be repeated every 5-10 years until you are 66 years old. You may need to be screened more often if early forms of precancerous polyps or small growths are found. Your health care provider may recommend screening at an earlier age if you have risk factors for colon cancer.  Your health care provider may recommend using home test kits to check for hidden blood in the stool.  A small camera at the end of a tube can be used to examine your colon (sigmoidoscopy or colonoscopy). This checks for the earliest forms of colorectal cancer.  Prostate and Testicular Cancer  Depending on your age and overall health, your health care provider may do certain tests to screen for prostate and testicular cancer.  Talk to your health care provider about any symptoms or concerns you have about testicular or prostate cancer.  Skin Cancer  Check your skin from head to toe regularly.  Tell your health care provider about any new moles or changes in moles, especially if: ? There is a change in a mole's size, shape, or color. ? You have a mole that is larger than a pencil eraser.  Always use sunscreen. Apply sunscreen liberally and repeat throughout the day.  Protect yourself by wearing long sleeves, pants, a wide-brimmed hat, and sunglasses when outside.  What should I know about heart disease, diabetes, and high blood pressure?  If you are 7-35 years of age, have your blood pressure checked every 3-5 years. If  you are 45 years of age or older, have your blood pressure checked every year. You should have your blood pressure measured twice-once when you are at a hospital or clinic, and once when you are not at a hospital or clinic. Record the average of the two measurements. To check your blood pressure when you are not at a hospital or clinic, you can use: ? An automated blood pressure machine at a pharmacy. ? A home blood pressure monitor.  Talk to your health care provider about your  target blood pressure.  If you are between 67-52 years old, ask your health care provider if you should take aspirin to prevent heart disease.  Have regular diabetes screenings by checking your fasting blood sugar level. ? If you are at a normal weight and have a low risk for diabetes, have this test once every three years after the age of 63. ? If you are overweight and have a high risk for diabetes, consider being tested at a younger age or more often.  A one-time screening for abdominal aortic aneurysm (AAA) by ultrasound is recommended for men aged 14-75 years who are current or former smokers. What should I know about preventing infection? Hepatitis B If you have a higher risk for hepatitis B, you should be screened for this virus. Talk with your health care provider to find out if you are at risk for hepatitis B infection. Hepatitis C Blood testing is recommended for:  Everyone born from [birth year] through 1964.  Anyone with known risk factors for hepatitis C.  Sexually Transmitted Diseases (STDs)  You should be screened each year for STDs including gonorrhea and chlamydia if: ? You are sexually active and are younger than 66 years of age. ? You are older than 66 years of age and your health care provider tells you that you are at risk for this type of infection. ? Your sexual activity has changed since you were last screened and you are at an increased risk for chlamydia or gonorrhea. Ask your health care provider if you are at risk.  Talk with your health care provider about whether you are at high risk of being infected with HIV. Your health care provider may recommend a prescription medicine to help prevent HIV infection.  What else can I do?  Schedule regular health, dental, and eye exams.  Stay current with your vaccines (immunizations).  Do not use any tobacco products, such as cigarettes, chewing tobacco, and e-cigarettes. If you need help quitting, ask your health care  provider.  Limit alcohol intake to no more than 2 drinks per day. One drink equals 12 ounces of beer, 5 ounces of wine, or 1 ounces of hard liquor.  Do not use street drugs.  Do not share needles.  Ask your health care provider for help if you need support or information about quitting drugs.  Tell your health care provider if you often feel depressed.  Tell your health care provider if you have ever been abused or do not feel safe at home. This information is not intended to replace advice given to you by your health care provider. Make sure you discuss any questions you have with your health care provider. Document Released: 12/01/2007 Document Revised: 02/01/2016 Document Reviewed: 03/08/2015 Elsevier Interactive Patient Education  [employer].

## 2018-04-15 NOTE — Assessment & Plan Note (Signed)
Chronic, mild increase noted. Off meds. The 10-year ASCVD risk score Mikey Bussing DC Brooke Bonito., et al., 2013) is: 13%   Values used to calculate the score:     Age: 67 years     Sex: Male     Is Non-Hispanic African American: No     Diabetic: No     Tobacco smoker: No     Systolic Blood Pressure: 530 mmHg     Is BP treated: No     HDL Cholesterol: 46.2 mg/dL     Total Cholesterol: 200 mg/dL

## 2018-04-15 NOTE — Assessment & Plan Note (Signed)
Continue nexium 40 mg daily.  

## 2018-04-15 NOTE — Assessment & Plan Note (Signed)
Refill generic sildenafil - effective

## 2018-04-15 NOTE — Assessment & Plan Note (Signed)
Reviewed with patient

## 2018-05-05 ENCOUNTER — Encounter: Payer: Self-pay | Admitting: Family Medicine

## 2018-05-05 ENCOUNTER — Ambulatory Visit (INDEPENDENT_AMBULATORY_CARE_PROVIDER_SITE_OTHER): Payer: Medicare HMO | Admitting: Family Medicine

## 2018-05-05 VITALS — BP 110/76 | HR 96 | Temp 98.4°F | Ht 72.5 in | Wt 219.6 lb

## 2018-05-05 DIAGNOSIS — J208 Acute bronchitis due to other specified organisms: Secondary | ICD-10-CM

## 2018-05-05 DIAGNOSIS — B9689 Other specified bacterial agents as the cause of diseases classified elsewhere: Secondary | ICD-10-CM

## 2018-05-05 DIAGNOSIS — R05 Cough: Secondary | ICD-10-CM

## 2018-05-05 DIAGNOSIS — R058 Other specified cough: Secondary | ICD-10-CM

## 2018-05-05 MED ORDER — AZITHROMYCIN 250 MG PO TABS: ORAL_TABLET | ORAL | 0 refills | Status: DC

## 2018-05-05 MED ORDER — GUAIFENESIN-CODEINE 100-10 MG/5ML PO SOLN: 10.0000 mL | Freq: Four times a day (QID) | ORAL | 0 refills | Status: DC | PRN

## 2018-05-05 MED ORDER — ALBUTEROL SULFATE HFA 108 (90 BASE) MCG/ACT IN AERS: 2.0000 | INHALATION_SPRAY | Freq: Four times a day (QID) | RESPIRATORY_TRACT | 0 refills | Status: DC | PRN

## 2018-05-05 MED ORDER — PREDNISONE 20 MG PO TABS: ORAL_TABLET | ORAL | 0 refills | Status: DC

## 2018-05-05 NOTE — Progress Notes (Signed)
Subjective:    Patient ID: Mark Martinez, male    DOB: 1950/06/26, 67 y.o.   MRN: [id number]  HPI  Presents to clinic c/o cough and cold symptoms off and on for over a month. States he has seen his PCP for this at end of October 2019, CXR done showing no pneumonia so he was treated with course of steroids & tessalon perles; it was suspected he had viral URI on top of his recent bronchitis. Cough seemed to improve a little, but did not go away. States he was producing a clear phlegm with cough, now it is thick and brown in color.   He was out hunting this past weekend, spent many hours in the woods.  He has never smoked.   I was able to review CXR from 04/15/18 in epic & his past 2 office visit notes.   Patient Active Problem List   Diagnosis Date Noted  . Advanced directives, counseling/discussion 04/15/2018  . Welcome to Medicare preventive visit 04/14/2018  . TMJ dysfunction 07/28/2015  . Prediabetes 07/28/2015  . Persistent cough 08/11/2014  . Epidermal inclusion cyst 08/12/2013  . HLD (hyperlipidemia)   . Healthcare maintenance 01/31/2011  . Overweight(278.02) 01/26/2010  . GERD (gastroesophageal reflux disease) 11/20/2007  . ERECTILE DYSFUNCTION 11/07/2007  . PROSTATITIS, HX OF 11/07/2007   Social History   Tobacco Use  . Smoking status: Never Smoker  . Smokeless tobacco: Former Network engineer Use Topics  . Alcohol use: No    Alcohol/week: 0.0 standard drinks     Review of Systems  Constitutional: Negative for chills, fatigue and fever.  HENT: +nasal congestion Eyes: Negative.   Respiratory: +cough with brown phlegm. Negative for shortness of breath and wheezing.   Cardiovascular: Negative for chest pain, palpitations and leg swelling.  Gastrointestinal: Negative for abdominal pain, diarrhea, nausea and vomiting.  Genitourinary: Negative for dysuria, frequency and urgency.  Musculoskeletal: Negative for arthralgias and myalgias.  Skin: Negative for color  change, pallor and rash.  Neurological: Negative for syncope, light-headedness and headaches.  Psychiatric/Behavioral: The patient is not nervous/anxious.       Objective:   Physical Exam  Constitutional: He is oriented to person, place, and time. No distress.  HENT:  Head: Normocephalic and atraumatic.  +post nasal drip  Eyes: Conjunctivae and EOM are normal. No scleral icterus.  Neck: Neck supple. No JVD present. No tracheal deviation present.  Cardiovascular: Normal rate and regular rhythm.  Pulmonary/Chest: Effort normal. No respiratory distress. He has wheezes (scattered wheezes throughout with rhonchi).  +wet cough  Musculoskeletal: He exhibits no edema.  Gait normal.   Neurological: He is alert and oriented to person, place, and time. No cranial nerve deficit.  Skin: Skin is warm and dry. He is not diaphoretic. No pallor.  Psychiatric: He has a normal mood and affect. His behavior is normal.  Nursing note and vitals reviewed.     Vitals:   05/05/18 1307  BP: 110/76  Pulse: 96  Temp: 98.4 F (36.9 C)  SpO2: 92%   Assessment & Plan:   Acute bacterial bronchitis, productive cough-offered chest x-ray in clinic today, patient declines due to recent chest x-ray at his PCP.  Due to continuance of cough and a change in phlegm color from clear to thick brown we will treat with azithromycin to cover respiratory infection.  Patient also will do a another steroid burst and use albuterol inhaler.  Advised to use albuterol inhaler at least 2 puffs 2 times per day  for the next 5 days to help open up lungs, and can also use as needed.  Patient will use Mucinex OTC during the day to help calm cough, and given Robitussin with codeine to use at night for cough suppression.  Patient does have a listed intolerance to codeine, intolerance states it keeps him awake.  Discussed this with patient, patient states last took codeine many years ago, since the time it kept him awake, he has used again did  not bother him.  [facility] PMP registry checked and is appropriate for fill of Robitussin with codeine.  Patient advised that if his symptoms continue to persist, he should follow back up and get a repeat chest x-ray.  Also if his cough continues to linger, we can consider pulmonology referral.

## 2018-11-21 ENCOUNTER — Telehealth: Payer: Self-pay | Admitting: Family Medicine

## 2018-11-28 ENCOUNTER — Telehealth: Payer: Self-pay | Admitting: Family Medicine

## 2018-11-28 NOTE — Telephone Encounter (Signed)
error 

## 2018-11-28 NOTE — Telephone Encounter (Signed)
Spoke with [facility] asking about pt's sildenafil rx and the number of tabs given.  Told it was due to the cost since pt's insurance does not cover the med.  Says pt can contact them and request as many tablets as he wants.  Attempted to contact pt.  No answer.  Vm box is full.  Need to relay message from [facility] above.

## 2018-11-28 NOTE — Telephone Encounter (Signed)
Patient stated that when he picked up his prescription that he only received 8 tablets.  Stated he normally receives 30 when he gets this filled. He is not sure why there was only 8.   PHONE- [phone]

## 2018-12-01 NOTE — Telephone Encounter (Signed)
Spoke with pt relaying message from [facility].  Pt verbalizes understanding.

## 2019-01-15 ENCOUNTER — Encounter: Payer: Self-pay | Admitting: Family Medicine

## 2019-01-15 ENCOUNTER — Ambulatory Visit (INDEPENDENT_AMBULATORY_CARE_PROVIDER_SITE_OTHER): Payer: Medicare HMO | Admitting: Family Medicine

## 2019-01-15 ENCOUNTER — Other Ambulatory Visit: Payer: Self-pay

## 2019-01-15 ENCOUNTER — Ambulatory Visit (INDEPENDENT_AMBULATORY_CARE_PROVIDER_SITE_OTHER)
Admission: RE | Admit: 2019-01-15 | Discharge: 2019-01-15 | Disposition: A | Discharge status: Home / Self Care | Payer: Medicare HMO | Source: Ambulatory Visit | Attending: Family Medicine | Admitting: Family Medicine

## 2019-01-15 VITALS — BP 118/76 | HR 63 | Temp 98.3°F | Ht 72.75 in | Wt 218.4 lb

## 2019-01-15 DIAGNOSIS — M25521 Pain in right elbow: Secondary | ICD-10-CM | POA: Diagnosis not present

## 2019-01-15 DIAGNOSIS — M19021 Primary osteoarthritis, right elbow: Secondary | ICD-10-CM | POA: Diagnosis not present

## 2019-01-15 DIAGNOSIS — M25721 Osteophyte, right elbow: Secondary | ICD-10-CM | POA: Diagnosis not present

## 2019-01-15 MED ORDER — DICLOFENAC SODIUM 1 % TD GEL: 2.0000 g | Freq: Three times a day (TID) | TRANSDERMAL | 1 refills | Status: DC

## 2019-01-15 NOTE — Patient Instructions (Addendum)
I think elbow pain is coming from wear and tear arthritis.  Xray today  Take tylenol 500mg  with lunch daily for 3-5 days to see if pain will improve.  May also use voltaren anti inflammatory gel to elbow as needed.  If arthritis - could consider osteo bi flex to see if any benefit.

## 2019-01-15 NOTE — Assessment & Plan Note (Signed)
Ongoing for years, acutely worse the past 2 wks. Anticipate osteoarthritis at elbow- check films for baseline. Discussed tylenol use, Rx voltaren gel PRN. Pt agrees with plan.

## 2019-01-15 NOTE — Progress Notes (Signed)
This visit was conducted in person.  BP 118/76 (BP Location: Right Arm, Patient Position: Sitting, Cuff Size: Normal)   Pulse 63   Temp 98.3 F (36.8 C) (Temporal)   Ht 6' 0.75" (1.848 m)   Wt 218 lb 6 oz (99.1 kg)   SpO2 92%   BMI 29.01 kg/m    CC: R elbow pain Subjective:    Patient ID: Mark Martinez, male    DOB: [birth year], 68 y.o.   MRN: [id number]  HPI: GERMANY CHELF is a 68 y.o. male presenting on 01/15/2019 for Elbow Pain (C/o right elbow pain. Started "awhile ago", worsening in last 2 wks. )   Ongoing bilateral elbow pain R>L for years, worse over last 2 weeks. Describes stiffness after elbow has been flexed for a while (ie talking on the phone). Notes more trouble when flexing elbow tight ie when he shoots his bow. 2d ago noticed sharp lateral elbow pain with pronation. 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 Worse pain as day goes on.   Tylenol helps some temporarily.  Does wear copper fit brace with some benefit.  No redness, warmth or swelling of elbow. No new rash Some intermittent R knee ache but no other joints significantly affected.     Relevant past medical, surgical, family and social history reviewed and updated as indicated. Interim medical history since our last visit reviewed. Allergies and medications reviewed and updated. Outpatient Medications Prior to Visit  Medication Sig Dispense Refill  . acetaminophen (TYLENOL) 325 MG tablet Take 650 mg by mouth every 6 (six) hours as needed for moderate pain.     Marland Kitchen esomeprazole (NEXIUM) 40 MG capsule TAKE 1 CAPSULE (40 MG TOTAL) BY MOUTH DAILY. 90 capsule 3  . sildenafil (REVATIO) 20 MG tablet TAKE 2-4 TABLETS BY MOUTH 3 TIMES DAILY 30 tablet 6  . albuterol (PROVENTIL HFA;VENTOLIN HFA) 108 (90 Base) MCG/ACT inhaler Inhale 2 puffs into the lungs every 6 (six) hours as needed for wheezing or shortness of breath. 1 Inhaler 0  . azithromycin  (ZITHROMAX) 250 MG tablet Take 2 tablets by mouth on day 1, take 1 tablet by mouth on days 2-5 6 tablet 0  . guaiFENesin-codeine 100-10 MG/5ML syrup Take 10 mLs by mouth every 6 (six) hours as needed for cough. 120 mL 0  . predniSONE (DELTASONE) 20 MG tablet Take two tablets daily for 3 days followed by one tablet daily for 3 days 9 tablet 0   No facility-administered medications prior to visit.      Per HPI unless specifically indicated in ROS section below Review of Systems Objective:    BP 118/76 (BP Location: Right Arm, Patient Position: Sitting, Cuff Size: Normal)   Pulse 63   Temp 98.3 F (36.8 C) (Temporal)   Ht 6' 0.75" (1.848 m)   Wt 218 lb 6 oz (99.1 kg)   SpO2 92%   BMI 29.01 kg/m   Wt Readings from Last 3 Encounters:  01/15/19 218 lb 6 oz (99.1 kg)  05/05/18 219 lb 9.6 oz (99.6 kg)  04/15/18 218 lb 12 oz (99.2 kg)    Physical Exam Vitals signs and nursing note reviewed.  Constitutional:      General: He is not in acute distress.    Appearance: Normal appearance. He is not ill-appearing.  Musculoskeletal:        General: No swelling, tenderness or deformity.     Comments:  L elbow WNL, except for mild limited ROM with full extension R elbow - mild limited ROM with full extension. No tenderness to palpation of olecranon or of epicondyles. No masses appreciated. Reproducible discomfort generalized to elbow with resisted pronation at wrist.  2+ rad pulses  Skin:    General: Skin is warm and dry.     Findings: No erythema or rash.  Neurological:     Mental Status: He is alert.  Psychiatric:        Mood and Affect: Mood normal.       Assessment & Plan:   Problem List Items Addressed This Visit    Right elbow pain - Primary    Ongoing for years, acutely worse the past 2 wks. Anticipate osteoarthritis at elbow- check films for baseline. Discussed tylenol use, Rx voltaren gel PRN. Pt agrees with plan.       Relevant Orders   DG Elbow Complete Right        Meds ordered this encounter  Medications  . diclofenac sodium (VOLTAREN) 1 % GEL    Sig: Apply 2 g topically 3 (three) times daily. To affected area (elbow)    Dispense:  100 g    Refill:  1   Orders Placed This Encounter  Procedures  . DG Elbow Complete Right    Standing Status:   Future    Number of Occurrences:   1    Standing Expiration Date:   03/17/2020    Order Specific Question:   Reason for Exam (SYMPTOM  OR DIAGNOSIS REQUIRED)    Answer:   R elbow pain eval for osteoarthritis    Order Specific Question:   Preferred imaging location?    Answer:   [facility]    Order Specific Question:   Radiology Contrast Protocol - do NOT remove file path    Answer:   [email or web address]    Patient Instructions  I think elbow pain is coming from wear and tear arthritis.  Xray today  Take tylenol 500mg  with lunch daily for 3-5 days to see if pain will improve.  May also use voltaren anti inflammatory gel to elbow as needed.  If arthritis - could consider osteo bi flex to see if any benefit.    Follow up plan: No follow-ups on file.  Ria Bush, MD

## 2019-01-19 DIAGNOSIS — R69 Illness, unspecified: Secondary | ICD-10-CM | POA: Diagnosis not present

## 2019-02-09 ENCOUNTER — Telehealth: Payer: Self-pay | Admitting: Family Medicine

## 2019-02-09 DIAGNOSIS — M25521 Pain in right elbow: Secondary | ICD-10-CM

## 2019-02-09 NOTE — Telephone Encounter (Signed)
Best number [phone] Pt called stating his right elbow is still hurting and would like a referral. Pt has [employer] and would like to make sure when referral is done they are in network

## 2019-02-09 NOTE — Telephone Encounter (Signed)
Referral placed.

## 2019-02-12 DIAGNOSIS — D2239 Melanocytic nevi of other parts of face: Secondary | ICD-10-CM | POA: Diagnosis not present

## 2019-02-12 DIAGNOSIS — L570 Actinic keratosis: Secondary | ICD-10-CM | POA: Diagnosis not present

## 2019-02-12 DIAGNOSIS — L821 Other seborrheic keratosis: Secondary | ICD-10-CM | POA: Diagnosis not present

## 2019-02-12 DIAGNOSIS — D1800 Hemangioma unspecified site: Secondary | ICD-10-CM | POA: Diagnosis not present

## 2019-02-12 DIAGNOSIS — D225 Melanocytic nevi of trunk: Secondary | ICD-10-CM | POA: Diagnosis not present

## 2019-02-12 DIAGNOSIS — L814 Other melanin hyperpigmentation: Secondary | ICD-10-CM | POA: Diagnosis not present

## 2019-02-12 DIAGNOSIS — D2339 Other benign neoplasm of skin of other parts of face: Secondary | ICD-10-CM | POA: Diagnosis not present

## 2019-02-13 DIAGNOSIS — M19021 Primary osteoarthritis, right elbow: Secondary | ICD-10-CM | POA: Diagnosis not present

## 2019-02-20 DIAGNOSIS — H2513 Age-related nuclear cataract, bilateral: Secondary | ICD-10-CM | POA: Diagnosis not present

## 2019-02-20 DIAGNOSIS — H01004 Unspecified blepharitis left upper eyelid: Secondary | ICD-10-CM | POA: Diagnosis not present

## 2019-02-20 DIAGNOSIS — H01002 Unspecified blepharitis right lower eyelid: Secondary | ICD-10-CM | POA: Diagnosis not present

## 2019-02-20 DIAGNOSIS — H01001 Unspecified blepharitis right upper eyelid: Secondary | ICD-10-CM | POA: Diagnosis not present

## 2019-03-12 DIAGNOSIS — L0291 Cutaneous abscess, unspecified: Secondary | ICD-10-CM | POA: Diagnosis not present

## 2019-03-12 DIAGNOSIS — L720 Epidermal cyst: Secondary | ICD-10-CM | POA: Diagnosis not present

## 2019-03-12 DIAGNOSIS — L821 Other seborrheic keratosis: Secondary | ICD-10-CM | POA: Diagnosis not present

## 2019-04-03 ENCOUNTER — Other Ambulatory Visit: Payer: Self-pay | Admitting: Family Medicine

## 2019-04-13 ENCOUNTER — Telehealth: Payer: Self-pay

## 2019-04-13 ENCOUNTER — Other Ambulatory Visit: Payer: Self-pay | Admitting: Family Medicine

## 2019-04-13 DIAGNOSIS — R7303 Prediabetes: Secondary | ICD-10-CM

## 2019-04-13 DIAGNOSIS — Z125 Encounter for screening for malignant neoplasm of prostate: Secondary | ICD-10-CM

## 2019-04-13 DIAGNOSIS — E785 Hyperlipidemia, unspecified: Secondary | ICD-10-CM

## 2019-04-13 NOTE — Telephone Encounter (Signed)
LVM w COVID screen, back lab and front door info

## 2019-04-16 ENCOUNTER — Other Ambulatory Visit: Payer: Self-pay

## 2019-04-16 ENCOUNTER — Other Ambulatory Visit (INDEPENDENT_AMBULATORY_CARE_PROVIDER_SITE_OTHER): Payer: Medicare HMO

## 2019-04-16 DIAGNOSIS — R7303 Prediabetes: Secondary | ICD-10-CM

## 2019-04-16 DIAGNOSIS — Z125 Encounter for screening for malignant neoplasm of prostate: Secondary | ICD-10-CM | POA: Diagnosis not present

## 2019-04-16 DIAGNOSIS — E785 Hyperlipidemia, unspecified: Secondary | ICD-10-CM | POA: Diagnosis not present

## 2019-04-16 LAB — COMPREHENSIVE METABOLIC PANEL
ALT: 17 U/L (ref 0–53)
AST: 19 U/L (ref 0–37)
Albumin: 4.1 g/dL (ref 3.5–5.2)
Alkaline Phosphatase: 96 U/L (ref 39–117)
BUN: 24 mg/dL — ABNORMAL HIGH (ref 6–23)
CO2: 32 mEq/L (ref 19–32)
Calcium: 9.3 mg/dL (ref 8.4–10.5)
Chloride: 101 mEq/L (ref 96–112)
Creatinine, Ser: 1.28 mg/dL (ref 0.40–1.50)
GFR: 56.06 mL/min — ABNORMAL LOW (ref >60.00)
Glucose, Bld: 108 mg/dL — ABNORMAL HIGH (ref 70–99)
Potassium: 4.9 mEq/L (ref 3.5–5.1)
Sodium: 139 mEq/L (ref 135–145)
Total Bilirubin: 0.6 mg/dL (ref 0.2–1.2)
Total Protein: 6.8 g/dL (ref 6.0–8.3)

## 2019-04-16 LAB — LIPID PANEL
Cholesterol: 193 mg/dL (ref 0–200)
HDL: 47.2 mg/dL (ref >39.00)
LDL Cholesterol: 121 mg/dL — ABNORMAL HIGH (ref 0–99)
NonHDL: 146.28
Total CHOL/HDL Ratio: 4
Triglycerides: 127 mg/dL (ref 0.0–149.0)
VLDL: 25.4 mg/dL (ref 0.0–40.0)

## 2019-04-16 LAB — HEMOGLOBIN A1C: Hgb A1c MFr Bld: 5.9 % (ref 4.6–6.5)

## 2019-04-16 LAB — FECAL OCCULT BLOOD, GUAIAC: Fecal Occult Blood: POSITIVE

## 2019-04-17 LAB — PSA: PSA: 0.13 ng/mL (ref 0.10–4.00)

## 2019-04-22 ENCOUNTER — Ambulatory Visit (INDEPENDENT_AMBULATORY_CARE_PROVIDER_SITE_OTHER): Payer: Medicare HMO | Admitting: Family Medicine

## 2019-04-22 ENCOUNTER — Other Ambulatory Visit: Payer: Self-pay

## 2019-04-22 ENCOUNTER — Encounter: Payer: Self-pay | Admitting: Family Medicine

## 2019-04-22 VITALS — BP 122/80 | HR 65 | Temp 97.9°F | Ht 72.0 in | Wt 217.1 lb

## 2019-04-22 DIAGNOSIS — Z23 Encounter for immunization: Secondary | ICD-10-CM

## 2019-04-22 DIAGNOSIS — Z1211 Encounter for screening for malignant neoplasm of colon: Secondary | ICD-10-CM

## 2019-04-22 DIAGNOSIS — Z0000 Encounter for general adult medical examination without abnormal findings: Secondary | ICD-10-CM

## 2019-04-22 DIAGNOSIS — F528 Other sexual dysfunction not due to a substance or known physiological condition: Secondary | ICD-10-CM

## 2019-04-22 DIAGNOSIS — R7303 Prediabetes: Secondary | ICD-10-CM

## 2019-04-22 DIAGNOSIS — K219 Gastro-esophageal reflux disease without esophagitis: Secondary | ICD-10-CM

## 2019-04-22 DIAGNOSIS — Z7189 Other specified counseling: Secondary | ICD-10-CM

## 2019-04-22 DIAGNOSIS — E785 Hyperlipidemia, unspecified: Secondary | ICD-10-CM

## 2019-04-22 DIAGNOSIS — M19029 Primary osteoarthritis, unspecified elbow: Secondary | ICD-10-CM

## 2019-04-22 MED ORDER — ESOMEPRAZOLE MAGNESIUM 40 MG PO CPDR: 40.0000 mg | DELAYED_RELEASE_CAPSULE | Freq: Every day | ORAL | 3 refills | Status: DC

## 2019-04-22 MED ORDER — SILDENAFIL CITRATE 20 MG PO TABS: 20.0000 mg | ORAL_TABLET | Freq: Every day | ORAL | 6 refills | Status: DC | PRN

## 2019-04-22 NOTE — Assessment & Plan Note (Signed)
Preventative protocols reviewed and updated unless pt declined. Discussed healthy diet and lifestyle.  

## 2019-04-22 NOTE — Assessment & Plan Note (Signed)
Advanced directive discussion - has this at home. Wife would be HCPOA. Asked to bring us a copy.  °

## 2019-04-22 NOTE — Addendum Note (Signed)
Addended by: Brenton Grills on: [id number] AM   Modules accepted: Orders

## 2019-04-22 NOTE — Assessment & Plan Note (Signed)
Chronic, stable off meds. Improvement noted. Reviewed ASCVD risk.  The 10-year ASCVD risk score Mikey Bussing DC Brooke Bonito., et al., 2013) is: 13.7%   Values used to calculate the score:     Age: 68 years     Sex: Male     Is Non-Hispanic African American: No     Diabetic: No     Tobacco smoker: No     Systolic Blood Pressure: 123XX123 mmHg     Is BP treated: No     HDL Cholesterol: 47.2 mg/dL     Total Cholesterol: 193 mg/dL

## 2019-04-22 NOTE — Progress Notes (Signed)
This visit was conducted in person.  BP 122/80 (BP Location: Left Arm, Patient Position: Sitting, Cuff Size: Normal)   Pulse 65   Temp 97.9 F (36.6 C) (Temporal)   Ht 6' (1.829 m)   Wt 217 lb 2 oz (98.5 kg)   SpO2 95%   BMI 29.45 kg/m    CC: AMW/CPE Subjective:    Patient ID: Mark Martinez, male    DOB: 05-Nov-1950, 68 y.o.   MRN: [id number]  HPI: Mark Martinez is a 68 y.o. male presenting on 04/22/2019 for Medicare Wellness   Did not see health advisor this year.  R>L elbow OA s/p steroid injection by ortho with benefit.    Hearing Screening   '125Hz'  '250Hz'  '500Hz'  '1000Hz'  '2000Hz'  '3000Hz'  '4000Hz'  '6000Hz'  '8000Hz'   Right ear:   '20 25 20  ' 0    Left ear:   '20 25 20  ' 0    Vision Screening Comments: Last eye exam, 02/2019.    Office Visit from 04/22/2019 in [place] [facility] [place]  PHQ-2 Total Score  0      Fall Risk  04/22/2019 04/15/2018  Falls in the past year? 0 No      Preventative: Colonoscopy - 2005, WNL rec rpt 10 years. iFOB negative 03/2018. rpt today.  Prostate - all normal checks in the past. Would like PSA but declines DRE.  AAA screen - not eligible  Lung cancer screen - not eligible Flu shotyearly Tdap 2014 Pneumovax today Shingrix - discussed  Advanced directive discussion - has this at home. Wife would be HCPOA. Asked to bring [place] a copy.  Seatbelt use discussed.  Sunscreen use discussed. No changing moles on skin. Saw derm - good report  Non smoker  Alcohol - none Dentist q6 mo  Eye exam yearly Bowel - no constipation Bladder - no incontinence   Caffeine: rare  Lives with wife, 1 dog.  Grown children  Occupation: Works in Biomedical scientist  Activity: stays active at work  Diet: daily fruits/vegetables, some water, decreased sweets     Relevant past medical, surgical, family and social history reviewed and updated as indicated. Interim medical history since our last visit reviewed. Allergies and medications reviewed and updated.  Outpatient Medications Prior to Visit  Medication Sig Dispense Refill  . acetaminophen (TYLENOL) 325 MG tablet Take 650 mg by mouth every 6 (six) hours as needed for moderate pain.     Marland Kitchen esomeprazole (NEXIUM) 40 MG capsule TAKE 1 CAPSULE BY MOUTH EVERY DAY 90 capsule 0  . sildenafil (REVATIO) 20 MG tablet TAKE 2-4 TABLETS BY MOUTH 3 TIMES DAILY 30 tablet 6  . diclofenac sodium (VOLTAREN) 1 % GEL Apply 2 g topically 3 (three) times daily. To affected area (elbow) 100 g 1   No facility-administered medications prior to visit.      Per HPI unless specifically indicated in ROS section below Review of Systems  Constitutional: Negative for activity change, appetite change, chills, fatigue, fever and unexpected weight change.  HENT: Negative for hearing loss.   Eyes: Negative for visual disturbance.  Respiratory: Negative for cough, chest tightness, shortness of breath and wheezing.   Cardiovascular: Negative for chest pain, palpitations and leg swelling.  Gastrointestinal: Negative for abdominal distention, abdominal pain, blood in stool (none noted), constipation, diarrhea, nausea and vomiting.  Genitourinary: Negative for difficulty urinating and hematuria.  Musculoskeletal: Negative for arthralgias, myalgias and neck pain.  Skin: Negative for rash.  Neurological: Negative for dizziness, seizures, syncope and headaches.  Hematological: Negative for adenopathy. Does not bruise/bleed easily.  Psychiatric/Behavioral: Negative for dysphoric mood. The patient is not nervous/anxious.    Objective:    BP 122/80 (BP Location: Left Arm, Patient Position: Sitting, Cuff Size: Normal)   Pulse 65   Temp 97.9 F (36.6 C) (Temporal)   Ht 6' (1.829 m)   Wt 217 lb 2 oz (98.5 kg)   SpO2 95%   BMI 29.45 kg/m   Wt Readings from Last 3 Encounters:  04/22/19 217 lb 2 oz (98.5 kg)  01/15/19 218 lb 6 oz (99.1 kg)  05/05/18 219 lb 9.6 oz (99.6 kg)    Physical Exam Vitals signs and nursing note  reviewed.  Constitutional:      General: He is not in acute distress.    Appearance: Normal appearance. He is well-developed. He is not ill-appearing.  HENT:     Head: Normocephalic and atraumatic.     Right Ear: Hearing, tympanic membrane, ear canal and external ear normal.     Left Ear: Hearing, tympanic membrane, ear canal and external ear normal.     Nose: Nose normal.     Mouth/Throat:     Mouth: Mucous membranes are moist.     Pharynx: Oropharynx is clear. Uvula midline. No posterior oropharyngeal erythema.  Eyes:     General: No scleral icterus.    Conjunctiva/sclera: Conjunctivae normal.     Pupils: Pupils are equal, round, and reactive to light.  Neck:     Musculoskeletal: Normal range of motion and neck supple.     Vascular: No carotid bruit.  Cardiovascular:     Rate and Rhythm: Normal rate and regular rhythm.     Pulses: Normal pulses.          Radial pulses are 2+ on the right side and 2+ on the left side.     Heart sounds: Normal heart sounds. No murmur.  Pulmonary:     Effort: Pulmonary effort is normal. No respiratory distress.     Breath sounds: Normal breath sounds. No wheezing, rhonchi or rales.  Abdominal:     General: Abdomen is flat. Bowel sounds are normal. There is no distension.     Palpations: Abdomen is soft. There is no mass.     Tenderness: There is no abdominal tenderness. There is no guarding or rebound.     Hernia: No hernia is present.  Musculoskeletal: Normal range of motion.     Right lower leg: No edema.     Left lower leg: No edema.  Lymphadenopathy:     Cervical: No cervical adenopathy.  Skin:    General: Skin is warm and dry.     Findings: No rash.  Neurological:     General: No focal deficit present.     Mental Status: He is alert and oriented to person, place, and time.     Comments:  CN grossly intact, station and gait intact Recall 2/3, 3/3 with cue Calculation 5/5 DLROW  Psychiatric:        Mood and Affect: Mood normal.         Behavior: Behavior normal.        Thought Content: Thought content normal.        Judgment: Judgment normal.       Results for orders placed or performed in visit on 04/16/19  PSA  Result Value Ref Range   PSA 0.13 0.10 - 4.00 ng/mL  Hemoglobin A1c  Result Value Ref Range   Hgb A1c MFr  Bld 5.9 4.6 - 6.5 %  Comprehensive metabolic panel  Result Value Ref Range   Sodium 139 135 - 145 mEq/L   Potassium 4.9 3.5 - 5.1 mEq/L   Chloride 101 96 - 112 mEq/L   CO2 32 19 - 32 mEq/L   Glucose, Bld 108 (H) 70 - 99 mg/dL   BUN 24 (H) 6 - 23 mg/dL   Creatinine, Ser 1.28 0.40 - 1.50 mg/dL   Total Bilirubin 0.6 0.2 - 1.2 mg/dL   Alkaline Phosphatase 96 39 - 117 U/L   AST 19 0 - 37 U/L   ALT 17 0 - 53 U/L   Total Protein 6.8 6.0 - 8.3 g/dL   Albumin 4.1 3.5 - 5.2 g/dL   Calcium 9.3 8.4 - 10.5 mg/dL   GFR 56.06 (L) >60.00 mL/min  Lipid panel  Result Value Ref Range   Cholesterol 193 0 - 200 mg/dL   Triglycerides 127.0 0.0 - 149.0 mg/dL   HDL 47.20 >39.00 mg/dL   VLDL 25.4 0.0 - 40.0 mg/dL   LDL Cholesterol 121 (H) 0 - 99 mg/dL   Total CHOL/HDL Ratio 4    NonHDL 146.28    Assessment & Plan:   Problem List Items Addressed This Visit    Primary osteoarthritis of elbow    Appreciate ortho care s/p steroid injection to R elbow with benefit.       Prediabetes    Encouraged limiting added sugars in diet.       Medicare annual wellness visit, initial - Primary    I have personally reviewed the Medicare Annual Wellness questionnaire and have noted 1. The patient's medical and social history 2. Their use of alcohol, tobacco or illicit drugs 3. Their current medications and supplements 4. The patient's functional ability including ADL's, fall risks, home safety risks and hearing or visual impairment. Cognitive function has been assessed and addressed as indicated.  5. Diet and physical activity 6. Evidence for depression or mood disorders The patients weight, height, BMI have been  recorded in the chart. I have made referrals, counseling and provided education to the patient based on review of the above and I have provided the pt with a written personalized care plan for preventive services. Provider list updated.. See scanned questionairre as needed for further documentation. Reviewed preventative protocols and updated unless pt declined.       HLD (hyperlipidemia)    Chronic, stable off meds. Improvement noted. Reviewed ASCVD risk.  The 10-year ASCVD risk score Mikey Bussing DC Brooke Bonito., et al., 2013) is: 13.7%   Values used to calculate the score:     Age: 61 years     Sex: Male     Is Non-Hispanic African American: No     Diabetic: No     Tobacco smoker: No     Systolic Blood Pressure: 466 mmHg     Is BP treated: No     HDL Cholesterol: 47.2 mg/dL     Total Cholesterol: 193 mg/dL       Relevant Medications   sildenafil (REVATIO) 20 MG tablet   Healthcare maintenance    Preventative protocols reviewed and updated unless pt declined. Discussed healthy diet and lifestyle.       GERD (gastroesophageal reflux disease)    Chronic, stable on nexium 63m daily. Breakthrough symptoms if missed dose. Reviewed risks of long term PPI use.       Relevant Medications   esomeprazole (NEXIUM) 40 MG capsule   ERECTILE DYSFUNCTION  Sildenafil refilled - working well.       Advanced directives, counseling/discussion    Advanced directive discussion - has this at home. Wife would be HCPOA. Asked to bring [place] a copy.        Other Visit Diagnoses    Special screening for malignant neoplasms, colon       Relevant Orders   Fecal occult blood, imunochemical       Meds ordered this encounter  Medications  . sildenafil (REVATIO) 20 MG tablet    Sig: Take 1-4 tablets (20-80 mg total) by mouth daily as needed (relations).    Dispense:  30 tablet    Refill:  6  . esomeprazole (NEXIUM) 40 MG capsule    Sig: Take 1 capsule (40 mg total) by mouth daily.    Dispense:  90  capsule    Refill:  3   Orders Placed This Encounter  Procedures  . Fecal occult blood, imunochemical    Standing Status:   Future    Standing Expiration Date:   04/21/2020    Patient instructions: Consider trial of glucosamine or vitamin D3 daily for osteoarthritis pain.  Pneumovax today Pass by lab to pick up stool kit.  If interested, check with pharmacy about new 2 shot shingles series (shingrix).  Bring [place] a copy of your living will to update your chart. You are doing well today Happy birthday! Return as needed or in 1 year for next wellness visit.   Follow up plan: Return in about 1 year (around 04/21/2020) for medicare wellness visit, annual exam, prior fasting for blood work.  Ria Bush, MD

## 2019-04-22 NOTE — Assessment & Plan Note (Signed)

## 2019-04-22 NOTE — Assessment & Plan Note (Signed)
Chronic, stable on nexium 40mg  daily. Breakthrough symptoms if missed dose. Reviewed risks of long term PPI use.

## 2019-04-22 NOTE — Assessment & Plan Note (Signed)
Encouraged limiting added sugars in diet.  

## 2019-04-22 NOTE — Assessment & Plan Note (Signed)
Appreciate ortho care s/p steroid injection to R elbow with benefit.

## 2019-04-22 NOTE — Patient Instructions (Addendum)
Consider trial of glucosamine or vitamin D3 daily for osteoarthritis pain.  Pneumovax today Pass by lab to pick up stool kit.  If interested, check with pharmacy about new 2 shot shingles series (shingrix).  Bring [place] a copy of your living will to update your chart. You are doing well today Happy birthday! Return as needed or in 1 year for next wellness visit.   Health Maintenance After Age 67 After age 59, you are at a higher risk for certain long-term diseases and infections as well as injuries from falls. Falls are a major cause of broken bones and head injuries in people who are older than age 36. Getting regular preventive care can help to keep you healthy and well. Preventive care includes getting regular testing and making lifestyle changes as recommended by your health care provider. Talk with your health care provider about:  Which screenings and tests you should have. A screening is a test that checks for a disease when you have no symptoms.  A diet and exercise plan that is right for you. What should I know about screenings and tests to prevent falls? Screening and testing are the best ways to find a health problem early. Early diagnosis and treatment give you the best chance of managing medical conditions that are common after age 42. Certain conditions and lifestyle choices may make you more likely to have a fall. Your health care provider may recommend:  Regular vision checks. Poor vision and conditions such as cataracts can make you more likely to have a fall. If you wear glasses, make sure to get your prescription updated if your vision changes.  Medicine review. Work with your health care provider to regularly review all of the medicines you are taking, including over-the-counter medicines. Ask your health care provider about any side effects that may make you more likely to have a fall. Tell your health care provider if any medicines that you take make you feel dizzy or  sleepy.  Osteoporosis screening. Osteoporosis is a condition that causes the bones to get weaker. This can make the bones weak and cause them to break more easily.  Blood pressure screening. Blood pressure changes and medicines to control blood pressure can make you feel dizzy.  Strength and balance checks. Your health care provider may recommend certain tests to check your strength and balance while standing, walking, or changing positions.  Foot health exam. Foot pain and numbness, as well as not wearing proper footwear, can make you more likely to have a fall.  Depression screening. You may be more likely to have a fall if you have a fear of falling, feel emotionally low, or feel unable to do activities that you used to do.  Alcohol use screening. Using too much alcohol can affect your balance and may make you more likely to have a fall. What actions can I take to lower my risk of falls? General instructions  Talk with your health care provider about your risks for falling. Tell your health care provider if: ? You fall. Be sure to tell your health care provider about all falls, even ones that seem minor. ? You feel dizzy, sleepy, or off-balance.  Take over-the-counter and prescription medicines only as told by your health care provider. These include any supplements.  Eat a healthy diet and maintain a healthy weight. A healthy diet includes low-fat dairy products, low-fat (lean) meats, and fiber from whole grains, beans, and lots of fruits and vegetables. Home safety  Remove  any tripping hazards, such as rugs, cords, and clutter.  Install safety equipment such as grab bars in bathrooms and safety rails on stairs.  Keep rooms and walkways well-lit. Activity   Follow a regular exercise program to stay fit. This will help you maintain your balance. Ask your health care provider what types of exercise are appropriate for you.  If you need a cane or walker, use it as recommended by  your health care provider.  Wear supportive shoes that have nonskid soles. Lifestyle  Do not drink alcohol if your health care provider tells you not to drink.  If you drink alcohol, limit how much you have: ? 0-1 drink a day for women. ? 0-2 drinks a day for men.  Be aware of how much alcohol is in your drink. In the [place] one drink equals one typical bottle of beer (12 oz), one-half glass of wine (5 oz), or one shot of hard liquor (1 oz).  Do not use any products that contain nicotine or tobacco, such as cigarettes and e-cigarettes. If you need help quitting, ask your health care provider. Summary  Having a healthy lifestyle and getting preventive care can help to protect your health and wellness after age 2.  Screening and testing are the best way to find a health problem early and help you avoid having a fall. Early diagnosis and treatment give you the best chance for managing medical conditions that are more common for people who are older than age 70.  Falls are a major cause of broken bones and head injuries in people who are older than age 52. Take precautions to prevent a fall at home.  Work with your health care provider to learn what changes you can make to improve your health and wellness and to prevent falls. This information is not intended to replace advice given to you by your health care provider. Make sure you discuss any questions you have with your health care provider. Document Released: 04/17/2017 Document Revised: 09/25/2018 Document Reviewed: 04/17/2017 Elsevier Patient Education  2020 [employer].

## 2019-04-22 NOTE — Assessment & Plan Note (Signed)
Sildenafil refilled - working well.

## 2019-05-03 DIAGNOSIS — Z20828 Contact with and (suspected) exposure to other viral communicable diseases: Secondary | ICD-10-CM | POA: Diagnosis not present

## 2019-05-11 ENCOUNTER — Telehealth: Payer: Self-pay | Admitting: Family Medicine

## 2019-05-11 DIAGNOSIS — Z1211 Encounter for screening for malignant neoplasm of colon: Secondary | ICD-10-CM

## 2019-05-11 NOTE — Telephone Encounter (Signed)
Received notice about positive stool test done by his insurance.  I would like to refer him to GI for further evaluation. Referral placed.

## 2019-05-12 ENCOUNTER — Encounter: Payer: Self-pay | Admitting: Family Medicine

## 2019-05-12 ENCOUNTER — Encounter: Payer: Self-pay | Admitting: Gastroenterology

## 2019-05-12 NOTE — Telephone Encounter (Signed)
Updated pt's chart.  Spoke with pt relaying Dr. Synthia Innocent message.  Pt verbalizes understanding.

## 2019-05-13 ENCOUNTER — Other Ambulatory Visit: Payer: Self-pay

## 2019-05-13 ENCOUNTER — Encounter: Payer: Self-pay | Admitting: Family Medicine

## 2019-05-13 ENCOUNTER — Ambulatory Visit (INDEPENDENT_AMBULATORY_CARE_PROVIDER_SITE_OTHER): Payer: Medicare HMO | Admitting: Family Medicine

## 2019-05-13 VITALS — Temp 96.3°F | Ht 72.0 in | Wt 217.0 lb

## 2019-05-13 DIAGNOSIS — Z20822 Contact with and (suspected) exposure to covid-19: Secondary | ICD-10-CM

## 2019-05-13 DIAGNOSIS — K529 Noninfective gastroenteritis and colitis, unspecified: Secondary | ICD-10-CM | POA: Insufficient documentation

## 2019-05-13 DIAGNOSIS — R197 Diarrhea, unspecified: Secondary | ICD-10-CM | POA: Insufficient documentation

## 2019-05-13 MED ORDER — ONDANSETRON HCL 4 MG PO TABS: 4.0000 mg | ORAL_TABLET | Freq: Three times a day (TID) | ORAL | 0 refills | Status: DC | PRN

## 2019-05-13 NOTE — Progress Notes (Signed)
   Mark Martinez - 68 y.o. male  MRN [id number]  Date of Birth: [birth year]  PCP: Ria Bush, MD  This service was provided via telemedicine. Phone Visit performed on 05/13/2019    Rationale for phone visit along with limitations reviewed. Patient consented to telephone encounter.    Location of patient: home Location of provider: in office, [place] @ [facility] Name of referring provider: N/A   Names of persons and role in encounter: Provider: Ria Bush, MD  Patient: Mark Martinez  Other: N/A   Time on call: 11:47 am - 12:00 pm   Subjective: Chief Complaint  Patient presents with  . Diarrhea    C/o diarrhea, nausea and vomiting.  [place].  Sxs started 05/06/19 since returning home.  Tested for COVID, neg result.   . Nasal Congestion    C/o a lot of nasal congestion.       HPI:  Recent trip to [place] (hunting trip) - was tested for covid on 05/03/2019 5d while there and tested negative.  Returned home 1 wk ago. Stayed in cabin, isolated with friend. Prior occupants of this cabin did test positive for Covid.   Late Saturday 05/09/2019 developed nausea, vomiting, severe diarrhea, fatigue. Monday/Tuesday felt some better. This morning nausea and watery diarrhea returned - several times between 2 and 8am. Some nasal congestion but not too severe.   No fevers/chills, coughing, loss of taste/smell, abd pain, headache, ST.  Has been treating with pepto bismol without benefit.    Objective/Observations:  No physical exam or vital signs collected unless specifically identified below.   Temp (!) 96.3 F (35.7 C)   Ht 6' (1.829 m)   Wt 217 lb (98.4 kg)   BMI 29.43 kg/m    Respiratory status: speaks in complete sentences without evident shortness of breath.   Assessment/Plan:  Diarrhea Several days of diarrheal illness in setting of possible covid exposure (at cabin in [place]). rec re-stesting today for Covid as well as self isolation at  home separate from wife until results return. Discussed possible viral gastroenteritis as alternative diagnosis. Reviewed treatment at home- imodium, zofran PRN nausea, push fluids and rest. Reviewed red flags to seek urgent care over weekend. Pt agrees with plan.    I discussed the assessment and treatment plan with the patient. The patient was provided an opportunity to ask questions and all were answered. The patient agreed with the plan and demonstrated an understanding of the instructions.   Lab Orders     Novel Coronavirus, NAA ([facility])  Meds ordered this encounter  Medications  . ondansetron (ZOFRAN) 4 MG tablet    Sig: Take 1 tablet (4 mg total) by mouth every 8 (eight) hours as needed for nausea or vomiting.    Dispense:  20 tablet    Refill:  0    The patient was advised to call back or seek an in-person evaluation if the symptoms worsen or if the condition fails to improve as anticipated.  Ria Bush, MD

## 2019-05-13 NOTE — Assessment & Plan Note (Signed)
Several days of diarrheal illness in setting of possible covid exposure (at cabin in [place]). rec re-stesting today for Covid as well as self isolation at home separate from wife until results return. Discussed possible viral gastroenteritis as alternative diagnosis. Reviewed treatment at home- imodium, zofran PRN nausea, push fluids and rest. Reviewed red flags to seek urgent care over weekend. Pt agrees with plan.

## 2019-05-15 LAB — NOVEL CORONAVIRUS, NAA: SARS-CoV-2, NAA: NOT DETECTED

## 2019-05-27 ENCOUNTER — Other Ambulatory Visit: Payer: Self-pay

## 2019-05-27 ENCOUNTER — Emergency Department
Admission: EM | Admit: 2019-05-27 | Discharge: 2019-05-27 | Disposition: A | Discharge status: Home / Self Care | Payer: Medicare HMO | Attending: Student in an Organized Health Care Education/Training Program | Admitting: Student in an Organized Health Care Education/Training Program

## 2019-05-27 ENCOUNTER — Encounter: Payer: Self-pay | Admitting: *Deleted

## 2019-05-27 DIAGNOSIS — S61217A Laceration without foreign body of left little finger without damage to nail, initial encounter: Secondary | ICD-10-CM

## 2019-05-27 DIAGNOSIS — Y929 Unspecified place or not applicable: Secondary | ICD-10-CM | POA: Insufficient documentation

## 2019-05-27 DIAGNOSIS — Z87891 Personal history of nicotine dependence: Secondary | ICD-10-CM | POA: Diagnosis not present

## 2019-05-27 DIAGNOSIS — Z23 Encounter for immunization: Secondary | ICD-10-CM | POA: Diagnosis not present

## 2019-05-27 DIAGNOSIS — Y93G1 Activity, food preparation and clean up: Secondary | ICD-10-CM | POA: Insufficient documentation

## 2019-05-27 DIAGNOSIS — S6992XA Unspecified injury of left wrist, hand and finger(s), initial encounter: Secondary | ICD-10-CM | POA: Diagnosis present

## 2019-05-27 DIAGNOSIS — W260XXA Contact with knife, initial encounter: Secondary | ICD-10-CM | POA: Insufficient documentation

## 2019-05-27 DIAGNOSIS — Y998 Other external cause status: Secondary | ICD-10-CM | POA: Insufficient documentation

## 2019-05-27 MED ORDER — LIDOCAINE HCL (PF) 1 % IJ SOLN
10.0000 mL | Freq: Once | INTRAMUSCULAR | Status: AC
  Administered 2019-05-27: 10 mL
  Filled 2019-05-27: qty 10

## 2019-05-27 MED ORDER — CEPHALEXIN 500 MG PO CAPS: 1000.0000 mg | ORAL_CAPSULE | Freq: Two times a day (BID) | ORAL | 0 refills | Status: DC

## 2019-05-27 MED ORDER — TETANUS-DIPHTH-ACELL PERTUSSIS 5-2.5-18.5 LF-MCG/0.5 IM SUSP
0.5000 mL | Freq: Once | INTRAMUSCULAR | Status: AC
  Administered 2019-05-27: 0.5 mL via INTRAMUSCULAR
  Filled 2019-05-27: qty 0.5

## 2019-05-27 NOTE — ED Provider Notes (Signed)
[facility] Emergency Department Provider Note  ____________________________________________  Time seen: Approximately 10:17 PM  I have reviewed the triage vital signs and the nursing notes.   HISTORY  Chief Complaint Laceration    HPI Mark Martinez is a 68 y.o. male who presents the emergency department with complaint of a laceration to the fifth digit of the left hand.  Patient was cutting a deer with a knife when the knife slipped, causing a laceration over the distal phalanx.  Full range of motion to the digit.  Sensation intact.  Bleeding controlled direct pressure.   No other injury or complaint.  No medications prior to arrival.  Last tetanus shot was approximately 8 years ago.        Past Medical History:  Diagnosis Date  . ED (erectile dysfunction)   . GERD (gastroesophageal reflux disease)    with hiatal hernia  . H/O hiatal hernia   . History of prostatitis   . HLD (hyperlipidemia)   . Prediabetes 07/28/2015  . Shortness of breath    exersion    Patient Active Problem List   Diagnosis Date Noted  . Diarrhea 05/13/2019  . Advanced directives, counseling/discussion 04/15/2018  . Medicare annual wellness visit, initial 04/14/2018  . TMJ dysfunction 07/28/2015  . Primary osteoarthritis of elbow 07/28/2015  . Prediabetes 07/28/2015  . Persistent cough 08/11/2014  . Epidermal inclusion cyst 08/12/2013  . HLD (hyperlipidemia)   . Healthcare maintenance 01/31/2011  . Overweight(278.02) 01/26/2010  . GERD (gastroesophageal reflux disease) 11/20/2007  . ERECTILE DYSFUNCTION 11/07/2007  . PROSTATITIS, HX OF 11/07/2007    Past Surgical History:  Procedure Laterality Date  . BACK SURGERY  1994   lumbar area  . COLONOSCOPY  2005   WNL, rpt 10 yrs  . CYST REMOVAL NECK Left 08/31/2013   Procedure: EXCISION EPIDERMAL INCLUSION CYST LEFT NECK;  Surgeon: Zenovia Jarred, MD  . ESOPHAGEAL DILATION     three last  .  ESOPHAGOGASTRODUODENOSCOPY  12-27-1996   hiatal hernia (Dr. Sharlett Iles)    Prior to Admission medications   Medication Sig Start Date End Date Taking? Authorizing Provider  acetaminophen (TYLENOL) 325 MG tablet Take 650 mg by mouth every 6 (six) hours as needed for moderate pain.     [provider]  cephALEXin (KEFLEX) 500 MG capsule Take 2 capsules (1,000 mg total) by mouth 2 (two) times daily. 05/27/19   Willson Lipa, Charline Bills, PA-C  esomeprazole (NEXIUM) 40 MG capsule Take 1 capsule (40 mg total) by mouth daily. 04/22/19   Ria Bush, MD  ondansetron (ZOFRAN) 4 MG tablet Take 1 tablet (4 mg total) by mouth every 8 (eight) hours as needed for nausea or vomiting. 05/13/19   Ria Bush, MD  sildenafil (REVATIO) 20 MG tablet Take 1-4 tablets (20-80 mg total) by mouth daily as needed (relations). 04/22/19   Ria Bush, MD    Allergies Codeine  Family History  Problem Relation Age of Onset  . Valvular heart disease Mother   . Ulcers Father   . CAD Father 70       multiple stents  . CAD Paternal Uncle   . Cancer Neg Hx   . Diabetes Neg Hx   . Stroke Neg Hx     Social History Social History   Tobacco Use  . Smoking status: Never Smoker  . Smokeless tobacco: Former Network engineer Use Topics  . Alcohol use: No    Alcohol/week: 0.0 standard drinks  . Drug use: No  Review of Systems  Constitutional: No fever/chills Eyes: No visual changes. No discharge ENT: No upper respiratory complaints. Cardiovascular: no chest pain. Respiratory: no cough. No SOB. Gastrointestinal: No abdominal pain.  No nausea, no vomiting.  Musculoskeletal: Positive for laceration to the fifth digit of the left hand Skin: Negative for rash, abrasions, lacerations, ecchymosis. Neurological: Negative for headaches, focal weakness or numbness. 10-point ROS otherwise negative.  ____________________________________________   PHYSICAL EXAM:  VITAL SIGNS: ED Triage Vitals   Enc Vitals Group     BP 05/27/19 2121 111/88     Pulse Rate 05/27/19 2121 76     Resp 05/27/19 2121 18     Temp 05/27/19 2121 98.2 F (36.8 C)     Temp Source 05/27/19 2121 Oral     SpO2 05/27/19 2121 98 %     Weight 05/27/19 2119 220 lb (99.8 kg)     Height 05/27/19 2119 6' (1.829 m)     Head Circumference --      Peak Flow --      Pain Score 05/27/19 2119 4     Pain Loc --      Pain Edu? --      Excl. in [place]? --      Constitutional: Alert and oriented. Well appearing and in no acute distress. Eyes: Conjunctivae are normal. PERRL. EOMI. Head: Atraumatic. Neck: No stridor.    Cardiovascular: Normal rate, regular rhythm. Normal S1 and S2.  Good peripheral circulation. Respiratory: Normal respiratory effort without tachypnea or retractions. Lungs CTAB. Good air entry to the bases with no decreased or absent breath sounds. Musculoskeletal: Full range of motion to all extremities. No gross deformities appreciated.  Visualization of the fifth digit left hand reveals linear laceration over the distal phalanx.  This occurs on the palmar aspect.  No active bleeding.  No visible foreign body.  Full range of motion to the entire digit.  Sensation and capillary refill intact. Neurologic:  Normal speech and language. No gross focal neurologic deficits are appreciated.  Skin:  Skin is warm, dry and intact. No rash noted. Psychiatric: Mood and affect are normal. Speech and behavior are normal. Patient exhibits appropriate insight and judgement.   ____________________________________________   LABS (all labs ordered are listed, but only abnormal results are displayed)  Labs Reviewed - No data to display ____________________________________________  EKG   ____________________________________________  RADIOLOGY   No results found.  ____________________________________________    PROCEDURES  Procedure(s) performed:    Marland KitchenMarland KitchenLaceration Repair  Date/Time: 05/27/2019 11:04  PM Performed by: Darletta Moll, PA-C Authorized by: Darletta Moll, PA-C   Consent:    Consent obtained:  Verbal   Consent given by:  Patient   Risks discussed:  Pain and infection Anesthesia (see MAR for exact dosages):    Anesthesia method:  Nerve block   Block location:  5th digit L hand   Block needle gauge:  27 G   Block anesthetic:  Lidocaine 1% w/o epi   Block technique:  Digital block   Block injection procedure:  Anatomic landmarks identified, introduced needle, negative aspiration for blood and incremental injection   Block outcome:  Anesthesia achieved Laceration details:    Location:  Finger   Finger location:  L small finger   Length (cm):  4 Repair type:    Repair type:  Simple Exploration:    Hemostasis achieved with:  Direct pressure   Wound exploration: wound explored through full range of motion and entire depth of wound probed  and visualized     Wound extent: no foreign bodies/material noted, no muscle damage noted, no nerve damage noted, no tendon damage noted, no underlying fracture noted and no vascular damage noted     Contaminated: no   Treatment:    Area cleansed with:  Betadine and saline   Amount of cleaning:  Extensive   Irrigation solution:  Sterile saline   Irrigation volume:  500 ml   Irrigation method:  Syringe Skin repair:    Repair method:  Sutures   Suture size:  4-0   Suture material:  Nylon   Suture technique:  Running locked   Number of sutures:  1 (1 running interlocked suture with 15 throws) Approximation:    Approximation:  Close Post-procedure details:    Patient tolerance of procedure:  Tolerated well, no immediate complications      Medications  Tdap (BOOSTRIX) injection 0.5 mL (0.5 mLs Intramuscular Given 05/27/19 2308)  lidocaine (PF) (XYLOCAINE) 1 % injection 10 mL (10 mLs Infiltration Given 05/27/19 2309)     ____________________________________________   INITIAL IMPRESSION / ASSESSMENT AND PLAN / ED  COURSE  Pertinent labs & imaging results that were available during my care of the patient were reviewed by me and considered in my medical decision making (see chart for details).  Review of the [place] CSRS was performed in accordance of the [place] prior to dispensing any controlled drugs.           Patient's diagnosis is consistent with finger laceration.  Patient presented to emergency department with a laceration of the fifth digit left hand.  Wound is closed as described above.  Patient tolerated well.  No complications.  Wound care instructions discussed with the patient.  Patient will be placed on antibiotics prophylactically.  Follow-up primary care in 1 week for suture removal.. Patient is given ED precautions to return to the ED for any worsening or new symptoms.     ____________________________________________  FINAL CLINICAL IMPRESSION(S) / ED DIAGNOSES  Final diagnoses:  Laceration of left little finger without foreign body without damage to nail, initial encounter      NEW MEDICATIONS STARTED DURING THIS VISIT:  ED Discharge Orders         Ordered    cephALEXin (KEFLEX) 500 MG capsule  2 times daily     05/27/19 2306              This chart was dictated using voice recognition software/Dragon. Despite best efforts to proofread, errors can occur which can change the meaning. Any change was purely unintentional.    Darletta Moll, PA-C 05/27/19 2313    Merlyn Lot, MD 05/27/19 2316

## 2019-05-27 NOTE — ED Triage Notes (Signed)
Pt has laceration to left 5th finger.  Pt cut finger with a knife while gutting a deer.  Bleeding controlled.

## 2019-06-02 ENCOUNTER — Telehealth: Payer: Self-pay | Admitting: Family Medicine

## 2019-06-02 NOTE — Telephone Encounter (Signed)
Pt went to [facility] er 12/9.  He needs an er follow up to remove stiches  Please advise where I can make appointment

## 2019-06-02 NOTE — Telephone Encounter (Signed)
May place in open 15 min slot Wed or Friday thanks.  Thanks.

## 2019-06-03 ENCOUNTER — Ambulatory Visit (INDEPENDENT_AMBULATORY_CARE_PROVIDER_SITE_OTHER): Payer: Medicare HMO | Admitting: Gastroenterology

## 2019-06-03 ENCOUNTER — Encounter: Payer: Self-pay | Admitting: Family Medicine

## 2019-06-03 ENCOUNTER — Other Ambulatory Visit: Payer: Self-pay

## 2019-06-03 ENCOUNTER — Ambulatory Visit (INDEPENDENT_AMBULATORY_CARE_PROVIDER_SITE_OTHER): Payer: Medicare HMO | Admitting: Family Medicine

## 2019-06-03 ENCOUNTER — Encounter: Payer: Self-pay | Admitting: Gastroenterology

## 2019-06-03 ENCOUNTER — Other Ambulatory Visit (INDEPENDENT_AMBULATORY_CARE_PROVIDER_SITE_OTHER): Payer: Medicare HMO

## 2019-06-03 VITALS — BP 120/80 | HR 72 | Temp 97.8°F | Ht 72.0 in | Wt 217.0 lb

## 2019-06-03 DIAGNOSIS — R195 Other fecal abnormalities: Secondary | ICD-10-CM

## 2019-06-03 DIAGNOSIS — Z1159 Encounter for screening for other viral diseases: Secondary | ICD-10-CM | POA: Diagnosis not present

## 2019-06-03 DIAGNOSIS — S61219D Laceration without foreign body of unspecified finger without damage to nail, subsequent encounter: Secondary | ICD-10-CM | POA: Insufficient documentation

## 2019-06-03 DIAGNOSIS — S61219A Laceration without foreign body of unspecified finger without damage to nail, initial encounter: Secondary | ICD-10-CM

## 2019-06-03 LAB — CBC WITH DIFFERENTIAL/PLATELET
Basophils Absolute: 0 10*3/uL (ref 0.0–0.1)
Basophils Relative: 0.4 % (ref 0.0–3.0)
Eosinophils Absolute: 0.2 10*3/uL (ref 0.0–0.7)
Eosinophils Relative: 3.1 % (ref 0.0–5.0)
HCT: 50.2 % (ref 39.0–52.0)
Hemoglobin: 16.5 g/dL (ref 13.0–17.0)
Lymphocytes Relative: 27.6 % (ref 12.0–46.0)
Lymphs Abs: 1.8 10*3/uL (ref 0.7–4.0)
MCHC: 32.8 g/dL (ref 30.0–36.0)
MCV: 92.1 fl (ref 78.0–100.0)
Monocytes Absolute: 0.5 10*3/uL (ref 0.1–1.0)
Monocytes Relative: 7.1 % (ref 3.0–12.0)
Neutro Abs: 4.1 10*3/uL (ref 1.4–7.7)
Neutrophils Relative %: 61.8 % (ref 43.0–77.0)
Platelets: 294 10*3/uL (ref 150.0–400.0)
RBC: 5.45 Mil/uL (ref 4.22–5.81)
RDW: 13.6 % (ref 11.5–15.5)
WBC: 6.6 10*3/uL (ref 4.0–10.5)

## 2019-06-03 NOTE — Progress Notes (Signed)
I agree with the above note, plan 

## 2019-06-03 NOTE — Progress Notes (Signed)
06/03/2019 Wood [id number] 10-03-1950   HISTORY OF PRESENT ILLNESS: This is a pleasant 68 year old male who is previously a patient of Dr. Buel Ream.  He is here today at the request of his PCP, Dr. Danise Mina, for evaluation regarding a positive fecal immunochemistry test.  His last colonoscopy was in 2005 and was normal at that time.  Since then he has been doing stool studies yearly with Dr. Danise Mina.  Previously negative.  He received a fecal immunochemistry study from his insurance company/[employer].  That study was positive.  He denies seeing any blood in his stools or black stools.  He moves his bowels regularly.  No complaints of abdominal pain or weight loss.  Says appetite is good.  No family history of colon cancer.  He says that his mother had colon polyps.   Past Medical History:  Diagnosis Date  . ED (erectile dysfunction)   . GERD (gastroesophageal reflux disease)    with hiatal hernia  . H/O hiatal hernia   . History of prostatitis   . HLD (hyperlipidemia)   . Prediabetes 07/28/2015  . Shortness of breath    exersion   Past Surgical History:  Procedure Laterality Date  . BACK SURGERY  1994   lumbar area  . COLONOSCOPY  2005   WNL, rpt 10 yrs  . CYST REMOVAL NECK Left 08/31/2013   Procedure: EXCISION EPIDERMAL INCLUSION CYST LEFT NECK;  Surgeon: Zenovia Jarred, MD  . ESOPHAGEAL DILATION     three last  . ESOPHAGOGASTRODUODENOSCOPY  12-27-1996   hiatal hernia (Dr. Sharlett Iles)    reports that he has never smoked. He quit smokeless tobacco use about 10 years ago. He reports that he does not drink alcohol or use drugs. family history includes CAD in his paternal uncle; CAD (age of onset: 79) in his father; Ulcers in his father; Valvular heart disease in his mother. Allergies  Allergen Reactions  . Codeine Other (See Comments)    Keeps him awake      Outpatient Encounter Medications as of 06/03/2019  Medication Sig  . acetaminophen (TYLENOL) 325 MG  tablet Take 650 mg by mouth every 6 (six) hours as needed for moderate pain.   . cephALEXin (KEFLEX) 500 MG capsule Take 2 capsules (1,000 mg total) by mouth 2 (two) times daily.  Marland Kitchen esomeprazole (NEXIUM) 40 MG capsule Take 1 capsule (40 mg total) by mouth daily.  . sildenafil (REVATIO) 20 MG tablet Take 1-4 tablets (20-80 mg total) by mouth daily as needed (relations).  . [DISCONTINUED] ondansetron (ZOFRAN) 4 MG tablet Take 1 tablet (4 mg total) by mouth every 8 (eight) hours as needed for nausea or vomiting.   No facility-administered encounter medications on file as of 06/03/2019.     REVIEW OF SYSTEMS  : All other systems reviewed and negative except where noted in the History of Present Illness.   PHYSICAL EXAM: BP 120/80   Pulse 72   Temp 97.8 F (36.6 C)   Ht 6' (1.829 m)   Wt 217 lb (98.4 kg)   BMI 29.43 kg/m  General: Well developed white male in no acute distress Head: Normocephalic and atraumatic Eyes:  Sclerae anicteric, conjunctiva pink. Ears: Normal auditory acuity Lungs: Clear throughout to auscultation; no increased WOB. Heart: Regular rate and rhythm; no M/R/G. Abdomen: Soft, non-distended.  BS present.  Non-tender. Rectal:  Will be done at the time of colonoscopy. Musculoskeletal: Symmetrical with no gross deformities  Skin: No lesions on visible extremities  Extremities: No edema  Neurological: Alert oriented x 4, grossly non-focal Psychological:  Alert and cooperative. Normal mood and affect  ASSESSMENT AND PLAN: *Positive IFOBT:  Last colonoscopy 15 years ago.  No sign of overt bleeding.  Will schedule for colonoscopy with Dr. [name].  I am going to check a CBC today as well since no blood counts have been done over the years.  **The risks, benefits, and alternatives to colonoscopy were discussed with the patient and he consents to proceed.   CC:  Ria Bush, MD

## 2019-06-03 NOTE — Patient Instructions (Addendum)
Return Friday for suture removal.

## 2019-06-03 NOTE — Progress Notes (Signed)
This visit was conducted in person.  BP 126/82 (BP Location: Left Arm, Patient Position: Sitting, Cuff Size: Normal)   Pulse 69   Temp 97.9 F (36.6 C) (Temporal)   Ht 6' (1.829 m)   Wt 216 lb 9 oz (98.2 kg)   SpO2 95%   BMI 29.37 kg/m    CC: suture removal Subjective:    Patient ID: Mark Martinez, male    DOB: August 13, 1950, 68 y.o.   MRN: [id number]  HPI: Mark Martinez is a 68 y.o. male presenting on 06/03/2019 for Suture / Staple Removal (Here to have sutures removed from left 5th finger.  Placed at [facility] ED on 05/27/19 due to laceration. )   DOI: 05/27/2019 Cut L fifth digit with knife while cutting a deer.  Laceration repaired at ER with 1 running locked suture with 15 throws.  Tdap updated.  Sent home with keflex BID course.  He has been changing bandage daily. No streaking redness, draining pus, fever, nausea.   Saw GI today for pos hemoccult, planned colonoscopy. Lab Results  Component Value Date   CREATININE 1.28 04/16/2019   BUN 24 (H) 04/16/2019   NA 139 04/16/2019   K 4.9 04/16/2019   CL 101 04/16/2019   CO2 32 04/16/2019        Relevant past medical, surgical, family and social history reviewed and updated as indicated. Interim medical history since our last visit reviewed. Allergies and medications reviewed and updated. Outpatient Medications Prior to Visit  Medication Sig Dispense Refill  . acetaminophen (TYLENOL) 325 MG tablet Take 650 mg by mouth every 6 (six) hours as needed for moderate pain.     . cephALEXin (KEFLEX) 500 MG capsule Take 2 capsules (1,000 mg total) by mouth 2 (two) times daily. 28 capsule 0  . esomeprazole (NEXIUM) 40 MG capsule Take 1 capsule (40 mg total) by mouth daily. 90 capsule 3  . sildenafil (REVATIO) 20 MG tablet Take 1-4 tablets (20-80 mg total) by mouth daily as needed (relations). 30 tablet 6   No facility-administered medications prior to visit.     Per HPI unless specifically indicated in ROS section below Review  of Systems Objective:    BP 126/82 (BP Location: Left Arm, Patient Position: Sitting, Cuff Size: Normal)   Pulse 69   Temp 97.9 F (36.6 C) (Temporal)   Ht 6' (1.829 m)   Wt 216 lb 9 oz (98.2 kg)   SpO2 95%   BMI 29.37 kg/m   Wt Readings from Last 3 Encounters:  06/03/19 216 lb 9 oz (98.2 kg)  06/03/19 217 lb (98.4 kg)  05/27/19 220 lb (99.8 kg)    Physical Exam Vitals and nursing note reviewed.  Musculoskeletal:        General: Signs of injury present.  Skin:    Findings: Laceration present. No erythema.     Comments: Transverse laceration across ventral 5th distal digit with edges well approximated       Results for orders placed or performed in visit on 06/03/19  CBC with Differential  Result Value Ref Range   WBC 6.6 4.0 - 10.5 K/uL   RBC 5.45 4.22 - 5.81 Mil/uL   Hemoglobin 16.5 13.0 - 17.0 g/dL   HCT 50.2 39.0 - 52.0 %   MCV 92.1 78.0 - 100.0 fl   MCHC 32.8 30.0 - 36.0 g/dL   RDW 13.6 11.5 - 15.5 %   Platelets 294.0 150.0 - 400.0 K/uL   Neutrophils Relative % 61.8  43.0 - 77.0 %   Lymphocytes Relative 27.6 12.0 - 46.0 %   Monocytes Relative 7.1 3.0 - 12.0 %   Eosinophils Relative 3.1 0.0 - 5.0 %   Basophils Relative 0.4 0.0 - 3.0 %   Neutro Abs 4.1 1.4 - 7.7 K/uL   Lymphs Abs 1.8 0.7 - 4.0 K/uL   Monocytes Absolute 0.5 0.1 - 1.0 K/uL   Eosinophils Absolute 0.2 0.0 - 0.7 K/uL   Basophils Absolute 0.0 0.0 - 0.1 K/uL   Assessment & Plan:  This visit occurred during the SARS-CoV-2 public health emergency.  Safety protocols were in place, including screening questions prior to the visit, additional usage of staff PPE, and extensive cleaning of exam room while observing appropriate contact time as indicated for disinfecting solutions.   No charge today as sutures not removed.  Problem List Items Addressed This Visit    Finger laceration, initial encounter    Healing well. I think he may benefit from keeping sutures in place for another 2 days - will ask him to  return Friday for suture removal. Pt agrees with plan.           No orders of the defined types were placed in this encounter.  No orders of the defined types were placed in this encounter.   Follow up plan: No follow-ups on file.  Ria Bush, MD

## 2019-06-03 NOTE — Assessment & Plan Note (Signed)
Healing well. I think he may benefit from keeping sutures in place for another 2 days - will ask him to return Friday for suture removal. Pt agrees with plan.

## 2019-06-03 NOTE — Patient Instructions (Signed)
If you are age 67 or older, your body mass index should be between 23-30. Your Body mass index is 29.43 kg/m. If this is out of the aforementioned range listed, please consider follow up with your Primary Care Provider.  If you are age 67 or younger, your body mass index should be between 19-25. Your Body mass index is 29.43 kg/m. If this is out of the aformentioned range listed, please consider follow up with your Primary Care Provider.   Your provider has requested that you go to the basement level for lab work before leaving today. Press "B" on the elevator. The lab is located at the first door on the left as you exit the elevator.  You have been scheduled for a colonoscopy. Please follow written instructions given to you at your visit today.  Please pick up your prep supplies at the pharmacy within the next 1-3 days. If you use inhalers (even only as needed), please bring them with you on the day of your procedure.

## 2019-06-03 NOTE — Telephone Encounter (Signed)
Appointment 12/16

## 2019-06-05 ENCOUNTER — Other Ambulatory Visit: Payer: Self-pay

## 2019-06-05 ENCOUNTER — Encounter: Payer: Self-pay | Admitting: Family Medicine

## 2019-06-05 ENCOUNTER — Ambulatory Visit (INDEPENDENT_AMBULATORY_CARE_PROVIDER_SITE_OTHER): Payer: Medicare HMO | Admitting: Family Medicine

## 2019-06-05 DIAGNOSIS — S61219D Laceration without foreign body of unspecified finger without damage to nail, subsequent encounter: Secondary | ICD-10-CM | POA: Diagnosis not present

## 2019-06-05 NOTE — Progress Notes (Signed)
This visit was conducted in person.  BP 112/70 (BP Location: Left Arm, Patient Position: Sitting, Cuff Size: Large)   Pulse 82   Temp 97.9 F (36.6 C) (Temporal)   Ht 6' (1.829 m)   Wt 218 lb 1 oz (98.9 kg)   SpO2 95%   BMI 29.57 kg/m    CC: Suture removal Subjective:    Patient ID: Mark Martinez, male    DOB: February 10, 1951, 68 y.o.   MRN: [id number]  HPI: Mark Martinez is a 68 y.o. male presenting on 06/05/2019 for Suture / Staple Removal (Here to have sutures removed form left 5th finger. )   See prior note for details:   DOI: 05/27/2019 Cut L fifth digit with knife while cutting a deer.  Laceration repaired at ER with 1 running locked suture with 15 throws.  Tdap updated.  Sent home with keflex BID course.  He has been changing bandage daily. No streaking redness, draining pus, fever, nausea.      Relevant past medical, surgical, family and social history reviewed and updated as indicated. Interim medical history since our last visit reviewed. Allergies and medications reviewed and updated. Outpatient Medications Prior to Visit  Medication Sig Dispense Refill  . acetaminophen (TYLENOL) 325 MG tablet Take 650 mg by mouth every 6 (six) hours as needed for moderate pain.     . cephALEXin (KEFLEX) 500 MG capsule Take 2 capsules (1,000 mg total) by mouth 2 (two) times daily. 28 capsule 0  . esomeprazole (NEXIUM) 40 MG capsule Take 1 capsule (40 mg total) by mouth daily. 90 capsule 3  . sildenafil (REVATIO) 20 MG tablet Take 1-4 tablets (20-80 mg total) by mouth daily as needed (relations). 30 tablet 6   No facility-administered medications prior to visit.     Per HPI unless specifically indicated in ROS section below Review of Systems Objective:    BP 112/70 (BP Location: Left Arm, Patient Position: Sitting, Cuff Size: Large)   Pulse 82   Temp 97.9 F (36.6 C) (Temporal)   Ht 6' (1.829 m)   Wt 218 lb 1 oz (98.9 kg)   SpO2 95%   BMI 29.57 kg/m   Wt Readings from  Last 3 Encounters:  06/05/19 218 lb 1 oz (98.9 kg)  06/03/19 216 lb 9 oz (98.2 kg)  06/03/19 217 lb (98.4 kg)    Physical Exam Vitals and nursing note reviewed.  Constitutional:      Appearance: Normal appearance.  Musculoskeletal:        General: Signs of injury present.  Skin:    Findings: Laceration present. No erythema.     Comments: Transverse laceration across ventral 5th distal digit with edges well approximated    Suture removal: Area cleaned with alcohol, then running locked suture removed. Pt tolerated procedure well. Good approximation of skin remains after suture removal. Steri strips applied. Home care reviewed. rec seek care if signs/symptoms of infection develop.     Assessment & Plan:  This visit occurred during the SARS-CoV-2 public health emergency.  Safety protocols were in place, including screening questions prior to the visit, additional usage of staff PPE, and extensive cleaning of exam room while observing appropriate contact time as indicated for disinfecting solutions.   Problem List Items Addressed This Visit    Finger laceration, subsequent encounter    Sutures removed. Pt tolerated well. Home care instructions provided. Red flags to seek further care reviewed. He is still finishing keflex course.  No orders of the defined types were placed in this encounter.  No orders of the defined types were placed in this encounter.   Follow up plan: No follow-ups on file.  Ria Bush, MD

## 2019-06-06 NOTE — Assessment & Plan Note (Signed)
Sutures removed. Pt tolerated well. Home care instructions provided. Red flags to seek further care reviewed. He is still finishing keflex course.

## 2019-06-16 ENCOUNTER — Encounter: Payer: Self-pay | Admitting: Gastroenterology

## 2019-06-18 ENCOUNTER — Telehealth: Payer: Self-pay | Admitting: Gastroenterology

## 2019-06-18 MED ORDER — NA SULFATE-K SULFATE-MG SULF 17.5-3.13-1.6 GM/177ML PO SOLN: 1.0000 | Freq: Once | ORAL | 0 refills | Status: AC

## 2019-06-18 NOTE — Telephone Encounter (Signed)
Left message letting patient know script for prep was sent to the pharmacy.

## 2019-06-19 DIAGNOSIS — U071 COVID-19: Secondary | ICD-10-CM

## 2019-06-19 HISTORY — DX: COVID-19: U07.1

## 2019-06-22 ENCOUNTER — Telehealth: Payer: Self-pay

## 2019-06-22 ENCOUNTER — Ambulatory Visit (INDEPENDENT_AMBULATORY_CARE_PROVIDER_SITE_OTHER): Payer: Medicare HMO

## 2019-06-22 DIAGNOSIS — Z1159 Encounter for screening for other viral diseases: Secondary | ICD-10-CM

## 2019-06-22 NOTE — Telephone Encounter (Signed)
[facility] reported the patient as having more than one symptom and since they do not do 'sick' patient covid testing and this is why the patient was deferred.  If the patient is symptomatic of something new, we would normally defer them as a precautionary measure. He should notify his GI doctor and follow up with his PCP would be our normal protocol.  The protocol would be to reschedule the patient and have him follow up with his PCP if he has concerns.

## 2019-06-22 NOTE — Telephone Encounter (Signed)
I spoke with the pt and he advised that he has a runny nose and when he told that to [facility] that he was not to be tested and should reschedule his procedure. I did reschedule the appt, the pt also informed me that he is going to a community testing facility now to be tested.  He says that the nurse at  [facility] said she spoke with someone at our office as was told to NOT test and to call our office to reschedule.  Sheri please advise if this is correct protocol to follow?

## 2019-06-22 NOTE — Telephone Encounter (Signed)
Denise please advise.  

## 2019-06-22 NOTE — Telephone Encounter (Signed)
[facility] Pathology called to state patient arrived for pre-procedure covid testing but patient had 'developed some symptoms' and was instructed to contact the GI physician for further instructions.  Covid testing was not performed.

## 2019-06-22 NOTE — Telephone Encounter (Signed)
Langley Gauss is [employer] [place].  I will defer to her.

## 2019-06-23 NOTE — Telephone Encounter (Signed)
The pt had a COVID test on 06/22/19 at the [facility] he will call when he gets results.  Appt to be tested has not been made as of today for his procedure.  We need to see if he test positive. I will check on him next week.

## 2019-06-23 NOTE — Telephone Encounter (Signed)
Looks like he is rescheduled for 1/20 for the colonoscopy and I agree with that.    He should contact his PCP about his URI symptoms and make decision about testing for Covid based on that discussion.    Thanks all.

## 2019-06-24 ENCOUNTER — Telehealth: Payer: Self-pay

## 2019-06-24 ENCOUNTER — Encounter: Payer: Self-pay | Admitting: Gastroenterology

## 2019-06-24 NOTE — Telephone Encounter (Signed)
-----   Message from Dow Adolph sent at 06/24/2019  1:02 PM EST ----- Pt said he is returning your call  [phone]

## 2019-06-24 NOTE — Telephone Encounter (Signed)
Pt left v/m that he has tested positive for covid(pt was stested at [facility]. I spoke with pt; presently pt has head congestion, dry cough, slight SOB upon exertion, pt has no other covid symptoms.no dizziness,no CP. Pt said symptoms started on 06/20/19 and on 06/20/19 pt had slight fever of 99 but no fever since 06/20/19. Pt does not feel too bad; pt stated he feels like I have a cold. Pt is taking mucinex and vicks day and night cough med. Pt was advised to stay hydrated, rest, tylenol prn and pt will quarantine for 10 days and last 3 days will not take fever reducing med. Last 3 days should have no fever and no major respirtory symptoms. Pt voiced understanding. If any further instructions call pt.

## 2019-06-24 NOTE — Telephone Encounter (Signed)
The pt called back and reports he is positive for COVID and will call back when he is cleared to get procedure rescheduled.

## 2019-06-25 NOTE — Telephone Encounter (Signed)
Spoke with pt relaying Dr. Synthia Innocent message about vit C.  Pt verbalizes understanding.  Also, added pt to Call Log.

## 2019-06-25 NOTE — Telephone Encounter (Signed)
plz check on patient tomorrow for symptom update.  I suggest he also take vit C 500mg  daily.  If worsening dyspnea on 7-10 day mark, recommend he seek in person care over weekend.  Will forward pt info to Jena Gauss RN (monoclonal Ab treatment team) as Juluis Rainier.

## 2019-06-26 NOTE — Telephone Encounter (Signed)
First day of symptoms 06/20/19 plz call Monday for another update and if doing better ok to stop following.

## 2019-06-26 NOTE — Telephone Encounter (Addendum)
Spoke with asking for update on sxs.  States he's not as congested and only has a little cough when waking in the mornings.  He has SOB when moving around the house but resolves after resting.  Recommended if SOB worsens over the weekend, pt seek UC or in person care.   Pt verbalizes understanding.  Fyi to Dr. Darnell Level.

## 2019-06-29 NOTE — Telephone Encounter (Signed)
Spoke with pt asking for update.  States he continues to improve.  No more head congestion.  Still has a little chest congestion and SOB is improving.  Pt is asking if his quarantine ends after 10 or 14 days.  Started quarantine 06/22/19.  Pls advise.

## 2019-06-29 NOTE — Telephone Encounter (Addendum)
Glad to hear.  His quarantine can end after 10 days from symptom onset assuming he is fever free for 24 hours off tylenol/ibuprofen and respiratory symptoms are improving.

## 2019-06-29 NOTE — Telephone Encounter (Signed)
Spoke with pt relaying Dr. G's message. Pt verbalizes understanding.  

## 2019-07-08 ENCOUNTER — Encounter: Payer: Self-pay | Admitting: Gastroenterology

## 2019-07-20 HISTORY — PX: COLONOSCOPY: SHX174

## 2019-07-29 ENCOUNTER — Encounter: Payer: Self-pay | Admitting: Gastroenterology

## 2019-07-29 ENCOUNTER — Other Ambulatory Visit: Payer: Self-pay

## 2019-07-29 ENCOUNTER — Ambulatory Visit (AMBULATORY_SURGERY_CENTER): Payer: Medicare HMO | Admitting: Gastroenterology

## 2019-07-29 VITALS — BP 116/78 | HR 60 | Temp 96.8°F | Resp 15 | Ht 72.0 in | Wt 218.0 lb

## 2019-07-29 DIAGNOSIS — K219 Gastro-esophageal reflux disease without esophagitis: Secondary | ICD-10-CM | POA: Diagnosis not present

## 2019-07-29 DIAGNOSIS — K648 Other hemorrhoids: Secondary | ICD-10-CM | POA: Diagnosis not present

## 2019-07-29 DIAGNOSIS — R195 Other fecal abnormalities: Secondary | ICD-10-CM

## 2019-07-29 DIAGNOSIS — E7800 Pure hypercholesterolemia, unspecified: Secondary | ICD-10-CM | POA: Diagnosis not present

## 2019-07-29 DIAGNOSIS — D124 Benign neoplasm of descending colon: Secondary | ICD-10-CM | POA: Diagnosis not present

## 2019-07-29 MED ORDER — SODIUM CHLORIDE 0.9 % IV SOLN: 500.0000 mL | INTRAVENOUS | Status: DC

## 2019-07-29 NOTE — Op Note (Signed)
[facility] Patient Name: Mark Martinez Procedure Date: 07/29/2019 8:58 AM MRN: [id number] Endoscopist: Milus Banister , MD Age: 69 Referring MD:  Date of Birth: Dec 13, 1950 Gender: Male Account #: [id number] Procedure:                Colonoscopy Indications:              Heme positive stool Medicines:                Monitored Anesthesia Care Procedure:                Pre-Anesthesia Assessment:                           - Prior to the procedure, a History and Physical                            was performed, and patient medications and                            allergies were reviewed. The patient's tolerance of                            previous anesthesia was also reviewed. The risks                            and benefits of the procedure and the sedation                            options and risks were discussed with the patient.                            All questions were answered, and informed consent                            was obtained. Prior Anticoagulants: The patient has                            taken no previous anticoagulant or antiplatelet                            agents. ASA Grade Assessment: II - A patient with                            mild systemic disease. After reviewing the risks                            and benefits, the patient was deemed in                            satisfactory condition to undergo the procedure.                           After obtaining informed consent, the colonoscope  was passed under direct vision. Throughout the                            procedure, the patient's blood pressure, pulse, and                            oxygen saturations were monitored continuously. The                            Colonoscope was introduced through the anus and                            advanced to the the cecum, identified by                            appendiceal orifice and ileocecal valve. The                       colonoscopy was performed without difficulty. The                            patient tolerated the procedure well. The quality                            of the bowel preparation was good. The ileocecal                            valve, appendiceal orifice, and rectum were                            photographed. Scope In: 9:07:01 AM Scope Out: 9:20:24 AM Scope Withdrawal Time: 0 hours 11 minutes 47 seconds  Total Procedure Duration: 0 hours 13 minutes 23 seconds  Findings:                 A 2 mm polyp was found in the descending colon. The                            polyp was sessile. The polyp was removed with a                            cold snare. Resection and retrieval were complete.                           External and internal hemorrhoids were found. The                            hemorrhoids were small.                           The exam was otherwise without abnormality on                            direct and retroflexion views. Complications:            No immediate  complications. Estimated blood loss:                            None. Estimated Blood Loss:     Estimated blood loss: none. Impression:               - One 2 mm polyp in the descending colon, removed                            with a cold snare. Resected and retrieved.                           - External and internal hemorrhoids.                           - The examination was otherwise normal on direct                            and retroflexion views. Recommendation:           - Patient has a contact number available for                            emergencies. The signs and symptoms of potential                            delayed complications were discussed with the                            patient. Return to normal activities tomorrow.                            Written discharge instructions were provided to the                            patient.                           - Resume  previous diet.                           - Continue present medications.                           - Await pathology results. Milus Banister, MD 07/29/2019 9:22:42 AM This report has been signed electronically.

## 2019-07-29 NOTE — Progress Notes (Signed)
Called to room to assist during endoscopic procedure.  Patient ID and intended procedure confirmed with present staff. Received instructions for my participation in the procedure from the performing physician.  

## 2019-07-29 NOTE — Patient Instructions (Signed)
YOU HAD AN ENDOSCOPIC PROCEDURE TODAY AT THE [facility]:   Refer to the procedure report that was given to you for any specific questions about what was found during the examination.  If the procedure report does not answer your questions, please call your gastroenterologist to clarify.  If you requested that your care partner not be given the details of your procedure findings, then the procedure report has been included in a sealed envelope for you to review at your convenience later.  YOU SHOULD EXPECT: Some feelings of bloating in the abdomen. Passage of more gas than usual.  Walking can help get rid of the air that was put into your GI tract during the procedure and reduce the bloating. If you had a lower endoscopy (such as a colonoscopy or flexible sigmoidoscopy) you may notice spotting of blood in your stool or on the toilet paper. If you underwent a bowel prep for your procedure, you may not have a normal bowel movement for a few days.  Please Note:  You might notice some irritation and congestion in your nose or some drainage.  This is from the oxygen used during your procedure.  There is no need for concern and it should clear up in a day or so.  SYMPTOMS TO REPORT IMMEDIATELY:   Following lower endoscopy (colonoscopy or flexible sigmoidoscopy):  Excessive amounts of blood in the stool  Significant tenderness or worsening of abdominal pains  Swelling of the abdomen that is new, acute  Fever of 100F or higher   For urgent or emergent issues, a gastroenterologist can be reached at any hour by calling [phone].   DIET:  We do recommend a small meal at first, but then you may proceed to your regular diet.  Drink plenty of fluids but you should avoid alcoholic beverages for 24 hours.  MEDICATIONS: Continue present medications.  Please see handouts given to you by your recovery nurse.  ACTIVITY:  You should plan to take it easy for the rest of today and you should  NOT DRIVE or use heavy machinery until tomorrow (because of the sedation medicines used during the test).    FOLLOW UP: Our staff will call the number listed on your records 48-72 hours following your procedure to check on you and address any questions or concerns that you may have regarding the information given to you following your procedure. If we do not reach you, we will leave a message.  We will attempt to reach you two times.  During this call, we will ask if you have developed any symptoms of COVID 19. If you develop any symptoms (ie: fever, flu-like symptoms, shortness of breath, cough etc.) before then, please call [phone].  If you test positive for Covid 19 in the 2 weeks post procedure, please call and report this information to us.    If any biopsies were taken you will be contacted by phone or by letter within the next 1-3 weeks.  Please call us at [phone] if you have not heard about the biopsies in 3 weeks.   Thank you for allowing us to provide for your healthcare needs today.   SIGNATURES/CONFIDENTIALITY: You and/or your care partner have signed paperwork which will be entered into your electronic medical record.  These signatures attest to the fact that that the information above on your After Visit Summary has been reviewed and is understood.  Full responsibility of the confidentiality of this discharge information lies with you and/or   your care-partner. 

## 2019-07-31 ENCOUNTER — Telehealth: Payer: Self-pay | Admitting: *Deleted

## 2019-07-31 NOTE — Telephone Encounter (Signed)
  Follow up Call-  Call back number 07/29/2019  Post procedure Call Back phone  # [phone]  Permission to leave phone message Yes  Some recent data might be hidden     Patient questions:  Do you have a fever, pain , or abdominal swelling? No. Pain Score  0 *  Have you tolerated food without any problems? Yes.    Have you been able to return to your normal activities? Yes.    Do you have any questions about your discharge instructions: Diet   No. Medications  No. Follow up visit  No.  Do you have questions or concerns about your Care? No.  Actions: * If pain score is 4 or above:  1. Have you developed a fever since your procedure? no  2.   Have you had an respiratory symptoms (SOB or cough) since your procedure? no  3.   Have you tested positive for COVID 19 since your procedure no  4.   Have you had any family members/close contacts diagnosed with the COVID 19 since your procedure? no   If yes to any of these questions please route to Joylene John, RN and Alphonsa Gin, Therapist, sports.

## 2019-08-04 ENCOUNTER — Encounter: Payer: Self-pay | Admitting: Gastroenterology

## 2019-08-17 ENCOUNTER — Telehealth: Payer: Self-pay | Admitting: *Deleted

## 2019-08-17 NOTE — Telephone Encounter (Signed)
Spoke with pt relaying Dr. G's message.  Pt verbalizes understanding and expresses his thanks.  

## 2019-08-17 NOTE — Telephone Encounter (Signed)
Patient called stating that he had covid back the first of January and wants to know when it will be safe for him to get the covid vaccine?

## 2019-08-17 NOTE — Telephone Encounter (Signed)
Would suggest waiting for 3 months - as should have good immunity for that amount of time.

## 2019-08-21 DIAGNOSIS — M19021 Primary osteoarthritis, right elbow: Secondary | ICD-10-CM | POA: Diagnosis not present

## 2019-08-21 DIAGNOSIS — M25521 Pain in right elbow: Secondary | ICD-10-CM | POA: Diagnosis not present

## 2019-09-11 DIAGNOSIS — S46911A Strain of unspecified muscle, fascia and tendon at shoulder and upper arm level, right arm, initial encounter: Secondary | ICD-10-CM | POA: Diagnosis not present

## 2019-09-11 DIAGNOSIS — M25511 Pain in right shoulder: Secondary | ICD-10-CM | POA: Diagnosis not present

## 2019-09-22 DIAGNOSIS — M25511 Pain in right shoulder: Secondary | ICD-10-CM | POA: Insufficient documentation

## 2019-09-28 DIAGNOSIS — M25511 Pain in right shoulder: Secondary | ICD-10-CM | POA: Diagnosis not present

## 2019-10-07 DIAGNOSIS — M25511 Pain in right shoulder: Secondary | ICD-10-CM | POA: Diagnosis not present

## 2019-10-14 DIAGNOSIS — M25511 Pain in right shoulder: Secondary | ICD-10-CM | POA: Diagnosis not present

## 2019-10-14 DIAGNOSIS — M7541 Impingement syndrome of right shoulder: Secondary | ICD-10-CM | POA: Diagnosis not present

## 2019-10-30 DIAGNOSIS — M7541 Impingement syndrome of right shoulder: Secondary | ICD-10-CM | POA: Insufficient documentation

## 2019-10-30 DIAGNOSIS — M7551 Bursitis of right shoulder: Secondary | ICD-10-CM | POA: Insufficient documentation

## 2019-11-09 DIAGNOSIS — M7551 Bursitis of right shoulder: Secondary | ICD-10-CM | POA: Diagnosis not present

## 2019-11-09 DIAGNOSIS — M25511 Pain in right shoulder: Secondary | ICD-10-CM | POA: Diagnosis not present

## 2019-11-09 DIAGNOSIS — M7541 Impingement syndrome of right shoulder: Secondary | ICD-10-CM | POA: Diagnosis not present

## 2019-12-05 ENCOUNTER — Encounter: Payer: Self-pay | Admitting: Family Medicine

## 2019-12-09 DIAGNOSIS — M25511 Pain in right shoulder: Secondary | ICD-10-CM | POA: Diagnosis not present

## 2019-12-09 DIAGNOSIS — M7551 Bursitis of right shoulder: Secondary | ICD-10-CM | POA: Diagnosis not present

## 2019-12-09 DIAGNOSIS — M7541 Impingement syndrome of right shoulder: Secondary | ICD-10-CM | POA: Diagnosis not present

## 2019-12-27 ENCOUNTER — Other Ambulatory Visit: Payer: Self-pay | Admitting: Family Medicine

## 2020-01-17 HISTORY — PX: SHOULDER ARTHROSCOPY WITH ROTATOR CUFF REPAIR AND SUBACROMIAL DECOMPRESSION: SHX5686

## 2020-01-19 DIAGNOSIS — S43431A Superior glenoid labrum lesion of right shoulder, initial encounter: Secondary | ICD-10-CM | POA: Diagnosis not present

## 2020-01-19 DIAGNOSIS — M7541 Impingement syndrome of right shoulder: Secondary | ICD-10-CM | POA: Diagnosis not present

## 2020-01-19 DIAGNOSIS — M65811 Other synovitis and tenosynovitis, right shoulder: Secondary | ICD-10-CM | POA: Diagnosis not present

## 2020-01-19 DIAGNOSIS — G8918 Other acute postprocedural pain: Secondary | ICD-10-CM | POA: Diagnosis not present

## 2020-01-19 DIAGNOSIS — M25512 Pain in left shoulder: Secondary | ICD-10-CM | POA: Diagnosis not present

## 2020-01-19 DIAGNOSIS — I890 Lymphedema, not elsewhere classified: Secondary | ICD-10-CM | POA: Diagnosis not present

## 2020-01-19 DIAGNOSIS — M659 Synovitis and tenosynovitis, unspecified: Secondary | ICD-10-CM | POA: Diagnosis not present

## 2020-01-19 DIAGNOSIS — M19011 Primary osteoarthritis, right shoulder: Secondary | ICD-10-CM | POA: Diagnosis not present

## 2020-01-19 DIAGNOSIS — M75121 Complete rotator cuff tear or rupture of right shoulder, not specified as traumatic: Secondary | ICD-10-CM | POA: Diagnosis not present

## 2020-01-19 DIAGNOSIS — Z4789 Encounter for other orthopedic aftercare: Secondary | ICD-10-CM | POA: Insufficient documentation

## 2020-01-19 DIAGNOSIS — M7551 Bursitis of right shoulder: Secondary | ICD-10-CM | POA: Diagnosis not present

## 2020-01-19 DIAGNOSIS — Z4889 Encounter for other specified surgical aftercare: Secondary | ICD-10-CM | POA: Diagnosis not present

## 2020-01-28 DIAGNOSIS — M25511 Pain in right shoulder: Secondary | ICD-10-CM | POA: Diagnosis not present

## 2020-01-28 DIAGNOSIS — M25611 Stiffness of right shoulder, not elsewhere classified: Secondary | ICD-10-CM | POA: Diagnosis not present

## 2020-02-04 DIAGNOSIS — M25611 Stiffness of right shoulder, not elsewhere classified: Secondary | ICD-10-CM | POA: Diagnosis not present

## 2020-02-04 DIAGNOSIS — M25511 Pain in right shoulder: Secondary | ICD-10-CM | POA: Diagnosis not present

## 2020-02-10 ENCOUNTER — Encounter: Payer: Self-pay | Admitting: Family Medicine

## 2020-02-11 DIAGNOSIS — M25511 Pain in right shoulder: Secondary | ICD-10-CM | POA: Diagnosis not present

## 2020-02-11 DIAGNOSIS — M25611 Stiffness of right shoulder, not elsewhere classified: Secondary | ICD-10-CM | POA: Diagnosis not present

## 2020-02-18 DIAGNOSIS — M25611 Stiffness of right shoulder, not elsewhere classified: Secondary | ICD-10-CM | POA: Diagnosis not present

## 2020-02-18 DIAGNOSIS — M25511 Pain in right shoulder: Secondary | ICD-10-CM | POA: Diagnosis not present

## 2020-02-23 DIAGNOSIS — M25611 Stiffness of right shoulder, not elsewhere classified: Secondary | ICD-10-CM | POA: Diagnosis not present

## 2020-02-23 DIAGNOSIS — M25511 Pain in right shoulder: Secondary | ICD-10-CM | POA: Diagnosis not present

## 2020-02-25 DIAGNOSIS — M25511 Pain in right shoulder: Secondary | ICD-10-CM | POA: Diagnosis not present

## 2020-02-25 DIAGNOSIS — R600 Localized edema: Secondary | ICD-10-CM | POA: Diagnosis not present

## 2020-02-25 DIAGNOSIS — M25611 Stiffness of right shoulder, not elsewhere classified: Secondary | ICD-10-CM | POA: Diagnosis not present

## 2020-03-01 DIAGNOSIS — M25511 Pain in right shoulder: Secondary | ICD-10-CM | POA: Diagnosis not present

## 2020-03-01 DIAGNOSIS — M25611 Stiffness of right shoulder, not elsewhere classified: Secondary | ICD-10-CM | POA: Diagnosis not present

## 2020-03-03 DIAGNOSIS — M25611 Stiffness of right shoulder, not elsewhere classified: Secondary | ICD-10-CM | POA: Diagnosis not present

## 2020-03-03 DIAGNOSIS — M25511 Pain in right shoulder: Secondary | ICD-10-CM | POA: Diagnosis not present

## 2020-03-07 DIAGNOSIS — M25611 Stiffness of right shoulder, not elsewhere classified: Secondary | ICD-10-CM | POA: Diagnosis not present

## 2020-03-07 DIAGNOSIS — M25511 Pain in right shoulder: Secondary | ICD-10-CM | POA: Diagnosis not present

## 2020-03-10 DIAGNOSIS — M25611 Stiffness of right shoulder, not elsewhere classified: Secondary | ICD-10-CM | POA: Diagnosis not present

## 2020-03-10 DIAGNOSIS — M25511 Pain in right shoulder: Secondary | ICD-10-CM | POA: Diagnosis not present

## 2020-03-16 DIAGNOSIS — M25611 Stiffness of right shoulder, not elsewhere classified: Secondary | ICD-10-CM | POA: Diagnosis not present

## 2020-03-16 DIAGNOSIS — M25511 Pain in right shoulder: Secondary | ICD-10-CM | POA: Diagnosis not present

## 2020-03-18 DIAGNOSIS — M25511 Pain in right shoulder: Secondary | ICD-10-CM | POA: Diagnosis not present

## 2020-03-18 DIAGNOSIS — M25611 Stiffness of right shoulder, not elsewhere classified: Secondary | ICD-10-CM | POA: Diagnosis not present

## 2020-03-20 ENCOUNTER — Encounter: Payer: Self-pay | Admitting: Family Medicine

## 2020-03-22 DIAGNOSIS — M25511 Pain in right shoulder: Secondary | ICD-10-CM | POA: Diagnosis not present

## 2020-03-22 DIAGNOSIS — M25611 Stiffness of right shoulder, not elsewhere classified: Secondary | ICD-10-CM | POA: Diagnosis not present

## 2020-03-22 DIAGNOSIS — Z20822 Contact with and (suspected) exposure to covid-19: Secondary | ICD-10-CM | POA: Diagnosis not present

## 2020-03-25 DIAGNOSIS — M25511 Pain in right shoulder: Secondary | ICD-10-CM | POA: Diagnosis not present

## 2020-03-25 DIAGNOSIS — M25611 Stiffness of right shoulder, not elsewhere classified: Secondary | ICD-10-CM | POA: Diagnosis not present

## 2020-03-28 DIAGNOSIS — M19021 Primary osteoarthritis, right elbow: Secondary | ICD-10-CM | POA: Diagnosis not present

## 2020-03-29 DIAGNOSIS — M25511 Pain in right shoulder: Secondary | ICD-10-CM | POA: Diagnosis not present

## 2020-03-29 DIAGNOSIS — M25611 Stiffness of right shoulder, not elsewhere classified: Secondary | ICD-10-CM | POA: Diagnosis not present

## 2020-04-04 DIAGNOSIS — M25511 Pain in right shoulder: Secondary | ICD-10-CM | POA: Diagnosis not present

## 2020-04-04 DIAGNOSIS — M25611 Stiffness of right shoulder, not elsewhere classified: Secondary | ICD-10-CM | POA: Diagnosis not present

## 2020-04-07 DIAGNOSIS — M25611 Stiffness of right shoulder, not elsewhere classified: Secondary | ICD-10-CM | POA: Diagnosis not present

## 2020-04-07 DIAGNOSIS — M25511 Pain in right shoulder: Secondary | ICD-10-CM | POA: Diagnosis not present

## 2020-04-12 DIAGNOSIS — M25511 Pain in right shoulder: Secondary | ICD-10-CM | POA: Diagnosis not present

## 2020-04-12 DIAGNOSIS — M25611 Stiffness of right shoulder, not elsewhere classified: Secondary | ICD-10-CM | POA: Diagnosis not present

## 2020-04-14 DIAGNOSIS — M25511 Pain in right shoulder: Secondary | ICD-10-CM | POA: Diagnosis not present

## 2020-04-14 DIAGNOSIS — M25611 Stiffness of right shoulder, not elsewhere classified: Secondary | ICD-10-CM | POA: Diagnosis not present

## 2020-04-19 DIAGNOSIS — M25611 Stiffness of right shoulder, not elsewhere classified: Secondary | ICD-10-CM | POA: Diagnosis not present

## 2020-04-19 DIAGNOSIS — M25511 Pain in right shoulder: Secondary | ICD-10-CM | POA: Diagnosis not present

## 2020-04-21 DIAGNOSIS — M25611 Stiffness of right shoulder, not elsewhere classified: Secondary | ICD-10-CM | POA: Diagnosis not present

## 2020-04-21 DIAGNOSIS — M25511 Pain in right shoulder: Secondary | ICD-10-CM | POA: Diagnosis not present

## 2020-04-25 ENCOUNTER — Encounter: Payer: Medicare HMO | Admitting: Family Medicine

## 2020-04-26 DIAGNOSIS — M25611 Stiffness of right shoulder, not elsewhere classified: Secondary | ICD-10-CM | POA: Diagnosis not present

## 2020-04-26 DIAGNOSIS — M25511 Pain in right shoulder: Secondary | ICD-10-CM | POA: Diagnosis not present

## 2020-04-28 DIAGNOSIS — M25611 Stiffness of right shoulder, not elsewhere classified: Secondary | ICD-10-CM | POA: Diagnosis not present

## 2020-04-28 DIAGNOSIS — M25511 Pain in right shoulder: Secondary | ICD-10-CM | POA: Diagnosis not present

## 2020-05-03 DIAGNOSIS — M25611 Stiffness of right shoulder, not elsewhere classified: Secondary | ICD-10-CM | POA: Diagnosis not present

## 2020-05-03 DIAGNOSIS — M25511 Pain in right shoulder: Secondary | ICD-10-CM | POA: Diagnosis not present

## 2020-05-06 DIAGNOSIS — M25611 Stiffness of right shoulder, not elsewhere classified: Secondary | ICD-10-CM | POA: Diagnosis not present

## 2020-05-06 DIAGNOSIS — M25511 Pain in right shoulder: Secondary | ICD-10-CM | POA: Diagnosis not present

## 2020-05-10 ENCOUNTER — Other Ambulatory Visit: Payer: Self-pay

## 2020-05-10 ENCOUNTER — Emergency Department: Payer: Medicare HMO

## 2020-05-10 ENCOUNTER — Ambulatory Visit: Payer: Medicare HMO | Admitting: Family Medicine

## 2020-05-10 ENCOUNTER — Observation Stay
Admission: EM | Admit: 2020-05-10 | Discharge: 2020-05-11 | Disposition: A | Discharge status: Home / Self Care | Payer: Medicare HMO | Attending: Surgery | Admitting: Surgery

## 2020-05-10 ENCOUNTER — Encounter: Payer: Self-pay | Admitting: Emergency Medicine

## 2020-05-10 DIAGNOSIS — Z8616 Personal history of COVID-19: Secondary | ICD-10-CM | POA: Diagnosis not present

## 2020-05-10 DIAGNOSIS — R0602 Shortness of breath: Secondary | ICD-10-CM | POA: Diagnosis not present

## 2020-05-10 DIAGNOSIS — R109 Unspecified abdominal pain: Secondary | ICD-10-CM

## 2020-05-10 DIAGNOSIS — R140 Abdominal distension (gaseous): Secondary | ICD-10-CM | POA: Diagnosis not present

## 2020-05-10 DIAGNOSIS — R1110 Vomiting, unspecified: Secondary | ICD-10-CM | POA: Diagnosis not present

## 2020-05-10 DIAGNOSIS — Z20822 Contact with and (suspected) exposure to covid-19: Secondary | ICD-10-CM | POA: Insufficient documentation

## 2020-05-10 DIAGNOSIS — K829 Disease of gallbladder, unspecified: Secondary | ICD-10-CM | POA: Diagnosis not present

## 2020-05-10 DIAGNOSIS — K810 Acute cholecystitis: Secondary | ICD-10-CM | POA: Diagnosis not present

## 2020-05-10 DIAGNOSIS — K828 Other specified diseases of gallbladder: Secondary | ICD-10-CM | POA: Diagnosis not present

## 2020-05-10 DIAGNOSIS — R7303 Prediabetes: Secondary | ICD-10-CM | POA: Insufficient documentation

## 2020-05-10 DIAGNOSIS — R9389 Abnormal findings on diagnostic imaging of other specified body structures: Secondary | ICD-10-CM

## 2020-05-10 DIAGNOSIS — R112 Nausea with vomiting, unspecified: Secondary | ICD-10-CM | POA: Diagnosis present

## 2020-05-10 DIAGNOSIS — R1011 Right upper quadrant pain: Secondary | ICD-10-CM

## 2020-05-10 DIAGNOSIS — K219 Gastro-esophageal reflux disease without esophagitis: Secondary | ICD-10-CM | POA: Insufficient documentation

## 2020-05-10 DIAGNOSIS — Z9049 Acquired absence of other specified parts of digestive tract: Secondary | ICD-10-CM | POA: Diagnosis present

## 2020-05-10 LAB — COMPREHENSIVE METABOLIC PANEL
ALT: 19 U/L (ref 0–44)
AST: 21 U/L (ref 15–41)
Albumin: 4 g/dL (ref 3.5–5.0)
Alkaline Phosphatase: 95 U/L (ref 38–126)
Anion gap: 13 (ref 5–15)
BUN: 19 mg/dL (ref 8–23)
CO2: 23 mmol/L (ref 22–32)
Calcium: 9.2 mg/dL (ref 8.9–10.3)
Chloride: 101 mmol/L (ref 98–111)
Creatinine, Ser: 1.03 mg/dL (ref 0.61–1.24)
GFR, Estimated: >60 mL/min (ref >60)
Glucose, Bld: 141 mg/dL — ABNORMAL HIGH (ref 70–99)
Potassium: 3.8 mmol/L (ref 3.5–5.1)
Sodium: 137 mmol/L (ref 135–145)
Total Bilirubin: 0.9 mg/dL (ref 0.3–1.2)
Total Protein: 7.5 g/dL (ref 6.5–8.1)

## 2020-05-10 LAB — CBC
HCT: 52.3 % — ABNORMAL HIGH (ref 39.0–52.0)
Hemoglobin: 17.3 g/dL — ABNORMAL HIGH (ref 13.0–17.0)
MCH: 30.2 pg (ref 26.0–34.0)
MCHC: 33.1 g/dL (ref 30.0–36.0)
MCV: 91.4 fL (ref 80.0–100.0)
Platelets: 265 10*3/uL (ref 150–400)
RBC: 5.72 MIL/uL (ref 4.22–5.81)
RDW: 13.2 % (ref 11.5–15.5)
WBC: 11.4 10*3/uL — ABNORMAL HIGH (ref 4.0–10.5)
nRBC: 0 % (ref 0.0–0.2)

## 2020-05-10 LAB — URINALYSIS, COMPLETE (UACMP) WITH MICROSCOPIC
Bacteria, UA: NONE SEEN
Bilirubin Urine: NEGATIVE
Glucose, UA: NEGATIVE mg/dL
Hgb urine dipstick: NEGATIVE
Ketones, ur: 20 mg/dL — AB
Leukocytes,Ua: NEGATIVE
Nitrite: NEGATIVE
Protein, ur: NEGATIVE mg/dL
Specific Gravity, Urine: >1.046 — ABNORMAL HIGH (ref 1.005–1.030)
Squamous Epithelial / HPF: NONE SEEN (ref 0–5)
WBC, UA: NONE SEEN WBC/hpf (ref 0–5)
pH: 6 (ref 5.0–8.0)

## 2020-05-10 LAB — RESP PANEL BY RT-PCR (FLU A&B, COVID) ARPGX2
Influenza A by PCR: NEGATIVE
Influenza B by PCR: NEGATIVE
SARS Coronavirus 2 by RT PCR: NEGATIVE

## 2020-05-10 LAB — LIPASE, BLOOD: Lipase: 24 U/L (ref 11–51)

## 2020-05-10 LAB — TROPONIN I (HIGH SENSITIVITY)
Troponin I (High Sensitivity): 4 ng/L (ref <18)
Troponin I (High Sensitivity): 3 ng/L (ref <18)

## 2020-05-10 MED ORDER — ONDANSETRON 4 MG PO TBDP: 4.0000 mg | ORAL_TABLET | Freq: Four times a day (QID) | ORAL | Status: DC | PRN

## 2020-05-10 MED ORDER — LACTATED RINGERS IV BOLUS
1000.0000 mL | Freq: Once | INTRAVENOUS | Status: AC
  Administered 2020-05-10: 1000 mL via INTRAVENOUS

## 2020-05-10 MED ORDER — IOHEXOL 300 MG/ML  SOLN
100.0000 mL | Freq: Once | INTRAMUSCULAR | Status: AC | PRN
  Administered 2020-05-10: 100 mL via INTRAVENOUS

## 2020-05-10 MED ORDER — ACETAMINOPHEN 500 MG PO TABS
1000.0000 mg | ORAL_TABLET | Freq: Four times a day (QID) | ORAL | Status: DC
  Administered 2020-05-10: 1000 mg via ORAL
  Filled 2020-05-10: qty 2

## 2020-05-10 MED ORDER — SODIUM CHLORIDE 0.9 % IV SOLN: INTRAVENOUS | Status: DC

## 2020-05-10 MED ORDER — INDOCYANINE GREEN 25 MG IV SOLR
2.5000 mg | Freq: Once | INTRAVENOUS | Status: AC
  Administered 2020-05-11: 2.5 mg via INTRAVENOUS
  Filled 2020-05-10: qty 1

## 2020-05-10 MED ORDER — ONDANSETRON HCL 4 MG/2ML IJ SOLN: 4.0000 mg | Freq: Four times a day (QID) | INTRAMUSCULAR | Status: DC | PRN

## 2020-05-10 MED ORDER — HYDROMORPHONE HCL 1 MG/ML IJ SOLN
0.5000 mg | Freq: Once | INTRAMUSCULAR | Status: AC
  Administered 2020-05-10: 0.5 mg via INTRAVENOUS
  Filled 2020-05-10: qty 1

## 2020-05-10 MED ORDER — MUPIROCIN 2 % EX OINT
1.0000 "application " | TOPICAL_OINTMENT | Freq: Two times a day (BID) | CUTANEOUS | Status: DC
  Filled 2020-05-10: qty 22

## 2020-05-10 MED ORDER — SODIUM CHLORIDE 0.9 % IV SOLN
2.0000 g | INTRAVENOUS | Status: DC
  Administered 2020-05-10: 2 g via INTRAVENOUS
  Filled 2020-05-10: qty 20
  Filled 2020-05-10: qty 2

## 2020-05-10 MED ORDER — HYDROMORPHONE HCL 1 MG/ML IJ SOLN: 0.5000 mg | INTRAMUSCULAR | Status: DC | PRN

## 2020-05-10 MED ORDER — MORPHINE SULFATE (PF) 4 MG/ML IV SOLN
4.0000 mg | Freq: Once | INTRAVENOUS | Status: AC
  Administered 2020-05-10: 4 mg via INTRAVENOUS
  Filled 2020-05-10: qty 1

## 2020-05-10 MED ORDER — ONDANSETRON HCL 4 MG/2ML IJ SOLN
4.0000 mg | Freq: Once | INTRAMUSCULAR | Status: AC
  Administered 2020-05-10: 4 mg via INTRAVENOUS
  Filled 2020-05-10: qty 2

## 2020-05-10 MED ORDER — PIPERACILLIN-TAZOBACTAM 3.375 G IVPB 30 MIN
3.3750 g | Freq: Once | INTRAVENOUS | Status: AC
  Administered 2020-05-10: 3.375 g via INTRAVENOUS
  Filled 2020-05-10: qty 50

## 2020-05-10 NOTE — ED Notes (Signed)
Pt taken to CT.

## 2020-05-10 NOTE — H&P (Signed)
Patient ID: Mark Martinez, male   DOB: 02-Jun-1951, 69 y.o.   MRN: [id number]  HPI Mark Martinez is a 69 y.o. male presents to the emergency room complaining of abdominal pain.  Patient says the pain started epigastric area and right upper quadrant.  It has been present since early in the morning.  He also has associated nausea and vomiting.  Pain is intermittent severe intensity and colicky in nature.  No fevers no chills no evidence of biliary obstruction.  Apparently about 2 days ago had a similar episode.  CT scan as well as ultrasound personally reviewed there is evidence of/and gallstones.  Normal common bile duct there is no definitive evidence of cholecystitis.  CBC and CMP were completely normal except mild ovation of the white count of 11.4.  He reports that the pain subsided with narcotics given in the emergency room but he still has the pain.  He is otherwise relatively healthy and is able to perform more than 4 METS of activity without any shortness of breath or chest pain. He denies any prior abdominal operations  HPI  Past Medical History:  Diagnosis Date  . Cataract   . COVID-19 virus infection 06/2019  . ED (erectile dysfunction)   . GERD (gastroesophageal reflux disease)    with hiatal hernia  . H/O hiatal hernia   . History of prostatitis   . HLD (hyperlipidemia)   . Prediabetes 07/28/2015  . Shortness of breath    exersion    Past Surgical History:  Procedure Laterality Date  . BACK SURGERY  1994   lumbar area  . COLONOSCOPY  2005   WNL, rpt 10 yrs  . COLONOSCOPY  07/2019   1 TA, hemorrhoids, rpt 7 yrs [name])  . CYST REMOVAL NECK Left 08/31/2013   Procedure: EXCISION EPIDERMAL INCLUSION CYST LEFT NECK;  Surgeon: Zenovia Jarred, MD  . ESOPHAGEAL DILATION     three last  . ESOPHAGOGASTRODUODENOSCOPY  12-27-1996   hiatal hernia (Dr. Sharlett Iles)  . SHOULDER ARTHROSCOPY WITH ROTATOR CUFF REPAIR AND SUBACROMIAL DECOMPRESSION Right 01/2020   distal clavicle  resection, RTC repair (Supple)    Family History  Problem Relation Age of Onset  . Valvular heart disease Mother   . Ulcers Father   . CAD Father 4       multiple stents  . CAD Paternal Uncle   . Cancer Neg Hx   . Diabetes Neg Hx   . Stroke Neg Hx     Social History Social History   Tobacco Use  . Smoking status: Never Smoker  . Smokeless tobacco: Former Network engineer Use Topics  . Alcohol use: No    Alcohol/week: 0.0 standard drinks  . Drug use: No    Allergies  Allergen Reactions  . Codeine Other (See Comments)    Keeps him awake    Current Facility-Administered Medications  Medication Dose Route Frequency Provider Last Rate Last Admin  . 0.9 %  sodium chloride infusion  500 mL Intravenous Continuous Milus Banister, MD      . Derrill Memo ON 05/11/2020] indocyanine green (IC-GREEN) injection 2.5 mg  2.5 mg Intravenous Once Keisuke Hollabaugh, Marjory Lies, MD       Current Outpatient Medications  Medication Sig Dispense Refill  . acetaminophen (TYLENOL) 325 MG tablet Take 650 mg by mouth every 6 (six) hours as needed for moderate pain.     Marland Kitchen esomeprazole (NEXIUM) 40 MG capsule Take 1 capsule (40 mg total) by mouth daily. Kenvir  capsule 3  . sildenafil (REVATIO) 20 MG tablet TAKE 2 TO 4 TABLETS BY MOUTH 3 TIMES DAILY 24 tablet 4     Review of Systems Full ROS  was asked and was negative except for the information on the HPI  Physical Exam Blood pressure (!) 150/95, pulse 92, temperature 98.9 F (37.2 C), temperature source Oral, resp. rate 17, height 6\' 1"  (1.854 m), weight 102.1 kg, SpO2 95 %. CONSTITUTIONAL: NAD EYES: Pupils are equal, round, Sclera are non-icteric. EARS, NOSE, MOUTH AND THROAT: Wearing a mask. Hearing is intact to voice. LYMPH NODES:  Lymph nodes in the neck are normal. RESPIRATORY:  Lungs are clear. There is normal respiratory effort, with equal breath sounds bilaterally, and without pathologic use of accessory muscles. CARDIOVASCULAR: Heart is regular without  murmurs, gallops, or rubs. GI: The abdomen is soft,TTP RUQ w + Murphy. There are no palpable masses. There is no hepatosplenomegaly. There are normal bowel sounds in all quadrants. GU: Rectal deferred.   MUSCULOSKELETAL: Normal muscle strength and tone. No cyanosis or edema.   SKIN: Turgor is good and there are no pathologic skin lesions or ulcers. NEUROLOGIC: Motor and sensation is grossly normal. Cranial nerves are grossly intact. PSYCH:  Oriented to person, place and time. Affect is normal.  Data Reviewed  I have personally reviewed the patient's imaging, laboratory findings and medical records.    Assessment/Plan 69 year old male with findings consistent with acute cholecystitis.  Discussed with patient detail about his disease process.  I definitely recommend admission to the hospital I have antibiotics IV fluids and prompt cholecystectomy.  We will tentatively put him on schedule for tomorrow pending or availability.The risks, benefits, complications, treatment options, and expected outcomes were discussed with the patient. The possibilities of bleeding, recurrent infection, finding a normal gallbladder, perforation of viscus organs, damage to surrounding structures, bile leak, abscess formation, needing a drain placed, the need for additional procedures, reaction to medication, pulmonary aspiration,  failure to diagnose a condition, the possible need to convert to an open procedure, and creating a complication requiring transfusion or operation were discussed with the patient. The patient and/or family concurred with the proposed plan, giving informed consent.   Caroleen Hamman, MD FACS General Surgeon 05/10/2020, 8:02 PM

## 2020-05-10 NOTE — ED Notes (Signed)
Consent completed for surgery

## 2020-05-10 NOTE — ED Notes (Signed)
Pt currently using urinal to provide UA. Pt escorted to bedside toilet with standby by assist.

## 2020-05-10 NOTE — ED Triage Notes (Signed)
Pt reports abd bloating and distention and cramping pain that radiates from his stomach up to his chest along with some NV. Pt reports had an episode Sunday but it got better and then another one today. Pt reports also some SOB

## 2020-05-10 NOTE — ED Notes (Signed)
Report called to mya rn all questions answered

## 2020-05-10 NOTE — ED Provider Notes (Signed)
[facility] Emergency Department Provider Note   ____________________________________________   First MD Initiated Contact with Patient 05/10/20 1212     (approximate)  I have reviewed the triage vital signs and the nursing notes.   HISTORY  Chief Complaint Abdominal Pain, Nausea, and Bloated    HPI Mark Martinez is a 69 y.o. male who reports severe cramping and nausea and vomiting today.  Pain started that way and is now just pressure and he says he feels like he is going to blow up.  He reports Sunday he had the same thing but it went away.  He has no chest pain or shortness of breath.  He has no fever or chills.  The pain went around to his back at least once but is not going there now.  Last stool was today and was normal.  He has no fever.  Pain is moderately severe currently.  It involves his whole abdomen.         Past Medical History:  Diagnosis Date  . Cataract   . COVID-19 virus infection 06/2019  . ED (erectile dysfunction)   . GERD (gastroesophageal reflux disease)    with hiatal hernia  . H/O hiatal hernia   . History of prostatitis   . HLD (hyperlipidemia)   . Prediabetes 07/28/2015  . Shortness of breath    exersion    Patient Active Problem List   Diagnosis Date Noted  . Heme positive stool 06/03/2019  . Screening for viral disease 06/03/2019  . Finger laceration, subsequent encounter 06/03/2019  . Diarrhea 05/13/2019  . Advanced directives, counseling/discussion 04/15/2018  . Medicare annual wellness visit, initial 04/14/2018  . TMJ dysfunction 07/28/2015  . Primary osteoarthritis of elbow 07/28/2015  . Prediabetes 07/28/2015  . Persistent cough 08/11/2014  . Epidermal inclusion cyst 08/12/2013  . HLD (hyperlipidemia)   . Healthcare maintenance 01/31/2011  . Overweight(278.02) 01/26/2010  . GERD (gastroesophageal reflux disease) 11/20/2007  . ERECTILE DYSFUNCTION 11/07/2007  . PROSTATITIS, HX OF 11/07/2007    Past  Surgical History:  Procedure Laterality Date  . BACK SURGERY  1994   lumbar area  . COLONOSCOPY  2005   WNL, rpt 10 yrs  . COLONOSCOPY  07/2019   1 TA, hemorrhoids, rpt 7 yrs [name])  . CYST REMOVAL NECK Left 08/31/2013   Procedure: EXCISION EPIDERMAL INCLUSION CYST LEFT NECK;  Surgeon: Zenovia Jarred, MD  . ESOPHAGEAL DILATION     three last  . ESOPHAGOGASTRODUODENOSCOPY  12-27-1996   hiatal hernia (Dr. Sharlett Iles)  . SHOULDER ARTHROSCOPY WITH ROTATOR CUFF REPAIR AND SUBACROMIAL DECOMPRESSION Right 01/2020   distal clavicle resection, RTC repair (Supple)    Prior to Admission medications   Medication Sig Start Date End Date Taking? Authorizing Provider  acetaminophen (TYLENOL) 325 MG tablet Take 650 mg by mouth every 6 (six) hours as needed for moderate pain.     [provider]  esomeprazole (NEXIUM) 40 MG capsule Take 1 capsule (40 mg total) by mouth daily. 04/22/19   Ria Bush, MD  sildenafil (REVATIO) 20 MG tablet TAKE 2 TO 4 TABLETS BY MOUTH 3 TIMES DAILY 12/28/19   Ria Bush, MD    Allergies Codeine  Family History  Problem Relation Age of Onset  . Valvular heart disease Mother   . Ulcers Father   . CAD Father 25       multiple stents  . CAD Paternal Uncle   . Cancer Neg Hx   . Diabetes Neg Hx   .  Stroke Neg Hx     Social History Social History   Tobacco Use  . Smoking status: Never Smoker  . Smokeless tobacco: Former Network engineer Use Topics  . Alcohol use: No    Alcohol/week: 0.0 standard drinks  . Drug use: No    Review of Systems  Constitutional: No fever/chills Eyes: No visual changes. ENT: No sore throat. Cardiovascular: Denies chest pain. Respiratory: Denies shortness of breath. Gastrointestinal: abdominal pain.  No nausea, no vomiting.  No diarrhea.  no onstipation. Genitourinary: Negative for dysuria. Musculoskeletal: Negative for back pain. Skin: Negative for rash. Neurological: Negative for headaches, focal  weakness   ____________________________________________   PHYSICAL EXAM:  VITAL SIGNS: ED Triage Vitals [05/10/20 1131]  Enc Vitals Group     BP (!) 148/93     Pulse Rate 68     Resp 20     Temp (!) 97.4 F (36.3 C)     Temp Source Oral     SpO2 100 %     Weight 225 lb (102.1 kg)     Height 6\' 1"  (1.854 m)     Head Circumference      Peak Flow      Pain Score 10     Pain Loc      Pain Edu?      Excl. in [place]?     Constitutional: Alert and oriented.  Looks uncomfortable Eyes: Conjunctivae are normal.  Head: Atraumatic. Nose: No congestion/rhinnorhea. Mouth/Throat: Mucous membranes are moist.  Oropharynx non-erythematous. Neck: No stridor.   Cardiovascular: Normal rate, regular rhythm. Grossly normal heart sounds.  Good peripheral circulation. Respiratory: Normal respiratory effort.  No retractions. Lungs CTAB. Gastrointestinal: Soft diffusely tender to palpation no distention. No abdominal bruits. No CVA tenderness. Musculoskeletal: No lower extremity tenderness nor edema.   Neurologic:  Normal speech and language.  neurologic deficits are appreciated. No gait instability. Skin:  Skin is warm, dry and intact. No rash noted.   ____________________________________________   LABS (all labs ordered are listed, but only abnormal results are displayed)  Labs Reviewed  COMPREHENSIVE METABOLIC PANEL - Abnormal; Notable for the following components:      Result Value   Glucose, Bld 141 (*)    All other components within normal limits  CBC - Abnormal; Notable for the following components:   WBC 11.4 (*)    Hemoglobin 17.3 (*)    HCT 52.3 (*)    All other components within normal limits  URINALYSIS, COMPLETE (UACMP) WITH MICROSCOPIC - Abnormal; Notable for the following components:   Color, Urine YELLOW (*)    APPearance CLEAR (*)    Specific Gravity, Urine >1.046 (*)    Ketones, ur 20 (*)    All other components within normal limits  RESP PANEL BY RT-PCR (FLU A&B,  COVID) ARPGX2  LIPASE, BLOOD  TROPONIN I (HIGH SENSITIVITY)  TROPONIN I (HIGH SENSITIVITY)   ____________________________________________  EKG  EKG read interpreted by me shows normal sinus rhythm rate of 68 normal axis essentially normal EKG ____________________________________________  RADIOLOGY Gertha Calkin, personally viewed and evaluated these images (plain radiographs) as part of my medical decision making, as well as reviewing the written report by the radiologist.  ED MD interpretation: CT of the abdomen and pelvis and ultrasound the right upper quadrant read by radiology reviewed by me do show some inflammation of the gallbladder.  No other pathology is noted.  Official radiology report(s): CT ABDOMEN PELVIS W CONTRAST  Result Date: 05/10/2020  CLINICAL DATA:  Abdominal distension. Severe cramping. Nausea and vomiting. Patient had similar episode 1 week ago. EXAM: CT ABDOMEN AND PELVIS WITH CONTRAST TECHNIQUE: Multidetector CT imaging of the abdomen and pelvis was performed using the standard protocol following bolus administration of intravenous contrast. CONTRAST:  176mL OMNIPAQUE IOHEXOL 300 MG/ML  SOLN COMPARISON:  None. FINDINGS: Lower chest: Heart size is normal. Lungs are clear. No focal nodule or mass lesion is present. Hepatobiliary: Diffuse fatty infiltration is noted in the liver. No focal lesions are present. Gallbladder wall thickening or edema is present, measuring up to 5 mm. There is some stranding about the gallbladder. The common bile duct is within normal limits. Pancreas: Unremarkable. No pancreatic ductal dilatation or surrounding inflammatory changes. Spleen: Normal in size without focal abnormality. Adrenals/Urinary Tract: Adrenal glands are normal bilaterally. Kidneys and ureters are within normal limits. No stone or mass lesion is present. No obstruction is present. The urinary bladder is normal. Stomach/Bowel: Small hiatal hernia is present. The stomach  and duodenum are otherwise within normal limits. Small bowel is unremarkable. Terminal ileum is normal. The appendix is visualized and within limits. The ascending and transverse colon are scratched at the ascending colon is normal. The transverse colon descending colon, and sigmoid colon are mostly collapsed. Rectum is unremarkable. Vascular/Lymphatic: No significant vascular findings are present. No enlarged abdominal or pelvic lymph nodes. Reproductive: Calcifications are present the prostate gland. Gland size is normal. Other: No abdominal wall hernia or abnormality. No abdominopelvic ascites. Musculoskeletal: Bilateral L5 pars defects are present with associated grade 2 anterolisthesis. Straightening of the normal lordosis is present. Degenerative endplate changes are associated with scoliosis. Asymmetric right-sided foraminal narrowing is present. No focal lytic or blastic lesions are present otherwise. Bony pelvis is intact. Hips are located and within normal limits. IMPRESSION: 1. Gallbladder wall thickening or edema with some stranding about the gallbladder. Findings are concerning for acute cholecystitis. Right upper quadrant ultrasound may be useful for further evaluation. 2. No other acute or focal lesion to explain the patient's symptoms. 3. Bilateral L5 pars defects with associated grade 2 anterolisthesis. 4. Asymmetric right-sided foraminal narrowing is present at L5-S1. Electronically Signed   By: San Morelle M.D.   On: 05/10/2020 13:01   US Abdomen Limited RUQ (LIVER/GB)  Result Date: 05/10/2020 CLINICAL DATA:  Abnormal CT with RIGHT upper quadrant pain in this 69 year old male. EXAM: ULTRASOUND ABDOMEN LIMITED RIGHT UPPER QUADRANT COMPARISON:  CT evaluation from May 10, 2020. FINDINGS: Gallbladder: Sludge in the gallbladder lumen. Small echogenic focus in the gallbladder lumen in the dependent gallbladder. This area shows no shadowing and is seen amidst sludge in the gallbladder.  No reported tenderness over the gallbladder. Wall thickness approximately 4 mm. Common bile duct: Diameter: 4.1 mm Liver: Echogenic hepatic parenchyma compatible with fatty infiltration. No visible lesion on submitted images. Portal vein is patent on color Doppler imaging with normal direction of blood flow towards the liver. Other: No ascites. IMPRESSION: 1. Equivocal ultrasound findings for acute cholecystitis. Gallbladder wall thickening is evident with sludge in the lumen. Small stone versus tumefactive sludge dependently and no reported tenderness over the gallbladder. HIDA scan may add specificity if clinical findings are equivocal. Stranding on the previous CT about the gallbladder remains concerning. 2. Tumefactive sludge or small polyp amidst sludge in the gallbladder. Electronically Signed   By: Zetta Bills M.D.   On: 05/10/2020 15:21    ____________________________________________   PROCEDURES  Procedure(s) performed (including Critical Care):  Procedures   ____________________________________________  INITIAL IMPRESSION / ASSESSMENT AND PLAN / ED COURSE  Discussed patient with Dr. Adora Fridge surgery who is currently in the OR.  He will come down and see the patient as soon as he gets done with the surgery.  He requests antibiotics which had given.  Anticipate  patient will be admitted by surgery.             ____________________________________________   FINAL CLINICAL IMPRESSION(S) / ED DIAGNOSES  Final diagnoses:  Abdominal pain, unspecified abdominal location  Gallbladder disease     ED Discharge Orders    None      *Please note:  Mark Martinez was evaluated in Emergency Department on 05/10/2020 for the symptoms described in the history of present illness. He was evaluated in the context of the global COVID-19 pandemic, which necessitated consideration that the patient might be at risk for infection with the SARS-CoV-2 virus that causes COVID-19.  Institutional protocols and algorithms that pertain to the evaluation of patients at risk for COVID-19 are in a state of rapid change based on information released by regulatory bodies including the CDC and federal and state organizations. These policies and algorithms were followed during the patient's care in the ED.  Some ED evaluations and interventions may be delayed as a result of limited staffing during and the pandemic.*   Note:  This document was prepared using Dragon voice recognition software and may include unintentional dictation errors.    Nena Polio, MD 05/10/20 1755

## 2020-05-10 NOTE — ED Notes (Signed)
Pt presents to ED with c/o of having severe ABD cramping and N/V. Pt is actively vomiting at this time. Pt presents clammy after vomiting. Pt state similar episode 1 week ago and states it went away but states it started again today. Pt denies fevers or chills. Pt states last BM was today and normal. Pt is A&Ox4. Pt denies being around any sick contacts.

## 2020-05-11 ENCOUNTER — Observation Stay: Payer: Medicare HMO | Admitting: Registered Nurse

## 2020-05-11 ENCOUNTER — Encounter: Payer: Self-pay | Admitting: Surgery

## 2020-05-11 ENCOUNTER — Encounter
Admission: EM | Disposition: A | Discharge status: Home / Self Care | Payer: Self-pay | Source: Home / Self Care | Attending: Emergency Medicine

## 2020-05-11 DIAGNOSIS — K810 Acute cholecystitis: Secondary | ICD-10-CM | POA: Diagnosis not present

## 2020-05-11 DIAGNOSIS — K819 Cholecystitis, unspecified: Secondary | ICD-10-CM | POA: Diagnosis not present

## 2020-05-11 LAB — COMPREHENSIVE METABOLIC PANEL
ALT: 22 U/L (ref 0–44)
AST: 24 U/L (ref 15–41)
Albumin: 3.3 g/dL — ABNORMAL LOW (ref 3.5–5.0)
Alkaline Phosphatase: 90 U/L (ref 38–126)
Anion gap: 11 (ref 5–15)
BUN: 16 mg/dL (ref 8–23)
CO2: 25 mmol/L (ref 22–32)
Calcium: 8.4 mg/dL — ABNORMAL LOW (ref 8.9–10.3)
Chloride: 102 mmol/L (ref 98–111)
Creatinine, Ser: 1.09 mg/dL (ref 0.61–1.24)
GFR, Estimated: >60 mL/min (ref >60)
Glucose, Bld: 118 mg/dL — ABNORMAL HIGH (ref 70–99)
Potassium: 3.7 mmol/L (ref 3.5–5.1)
Sodium: 138 mmol/L (ref 135–145)
Total Bilirubin: 1 mg/dL (ref 0.3–1.2)
Total Protein: 6.6 g/dL (ref 6.5–8.1)

## 2020-05-11 LAB — CBC
HCT: 48 % (ref 39.0–52.0)
Hemoglobin: 16.3 g/dL (ref 13.0–17.0)
MCH: 31 pg (ref 26.0–34.0)
MCHC: 34 g/dL (ref 30.0–36.0)
MCV: 91.3 fL (ref 80.0–100.0)
Platelets: 260 10*3/uL (ref 150–400)
RBC: 5.26 MIL/uL (ref 4.22–5.81)
RDW: 13.4 % (ref 11.5–15.5)
WBC: 11 10*3/uL — ABNORMAL HIGH (ref 4.0–10.5)
nRBC: 0 % (ref 0.0–0.2)

## 2020-05-11 LAB — HIV ANTIBODY (ROUTINE TESTING W REFLEX): HIV Screen 4th Generation wRfx: NONREACTIVE

## 2020-05-11 LAB — SURGICAL PCR SCREEN
MRSA, PCR: NEGATIVE
Staphylococcus aureus: NEGATIVE

## 2020-05-11 SURGERY — CHOLECYSTECTOMY, ROBOT-ASSISTED, LAPAROSCOPIC
Anesthesia: General

## 2020-05-11 MED ORDER — DEXAMETHASONE SODIUM PHOSPHATE 10 MG/ML IJ SOLN
INTRAMUSCULAR | Status: AC
  Filled 2020-05-11: qty 1

## 2020-05-11 MED ORDER — OXYCODONE HCL 5 MG PO TABS: 5.0000 mg | ORAL_TABLET | Freq: Four times a day (QID) | ORAL | 0 refills | Status: DC | PRN

## 2020-05-11 MED ORDER — PROPOFOL 10 MG/ML IV BOLUS
INTRAVENOUS | Status: DC | PRN
  Administered 2020-05-11: 130 mg via INTRAVENOUS

## 2020-05-11 MED ORDER — AMOXICILLIN-POT CLAVULANATE 875-125 MG PO TABS: 1.0000 | ORAL_TABLET | Freq: Two times a day (BID) | ORAL | 0 refills | Status: AC

## 2020-05-11 MED ORDER — KETOROLAC TROMETHAMINE 30 MG/ML IJ SOLN
INTRAMUSCULAR | Status: DC | PRN
  Administered 2020-05-11: 30 mg via INTRAVENOUS

## 2020-05-11 MED ORDER — ONDANSETRON HCL 4 MG/2ML IJ SOLN
INTRAMUSCULAR | Status: DC | PRN
  Administered 2020-05-11: 4 mg via INTRAVENOUS

## 2020-05-11 MED ORDER — SUGAMMADEX SODIUM 200 MG/2ML IV SOLN
INTRAVENOUS | Status: DC | PRN
  Administered 2020-05-11: 300 mg via INTRAVENOUS

## 2020-05-11 MED ORDER — PROPOFOL 10 MG/ML IV BOLUS
INTRAVENOUS | Status: AC
  Filled 2020-05-11: qty 20

## 2020-05-11 MED ORDER — BUPIVACAINE-EPINEPHRINE (PF) 0.25% -1:200000 IJ SOLN
INTRAMUSCULAR | Status: AC
  Filled 2020-05-11: qty 30

## 2020-05-11 MED ORDER — FENTANYL CITRATE (PF) 100 MCG/2ML IJ SOLN
INTRAMUSCULAR | Status: AC
  Filled 2020-05-11: qty 2

## 2020-05-11 MED ORDER — LIDOCAINE HCL (CARDIAC) PF 100 MG/5ML IV SOSY
PREFILLED_SYRINGE | INTRAVENOUS | Status: DC | PRN
  Administered 2020-05-11: 100 mg via INTRAVENOUS

## 2020-05-11 MED ORDER — ROCURONIUM BROMIDE 100 MG/10ML IV SOLN
INTRAVENOUS | Status: DC | PRN
  Administered 2020-05-11: 50 mg via INTRAVENOUS
  Administered 2020-05-11: 10 mg via INTRAVENOUS
  Administered 2020-05-11: 20 mg via INTRAVENOUS

## 2020-05-11 MED ORDER — SUGAMMADEX SODIUM 500 MG/5ML IV SOLN
INTRAVENOUS | Status: AC
  Filled 2020-05-11: qty 5

## 2020-05-11 MED ORDER — IBUPROFEN 800 MG PO TABS: 800.0000 mg | ORAL_TABLET | Freq: Three times a day (TID) | ORAL | 0 refills | Status: DC | PRN

## 2020-05-11 MED ORDER — ONDANSETRON HCL 4 MG/2ML IJ SOLN
INTRAMUSCULAR | Status: AC
  Filled 2020-05-11: qty 2

## 2020-05-11 MED ORDER — ROCURONIUM BROMIDE 10 MG/ML (PF) SYRINGE
PREFILLED_SYRINGE | INTRAVENOUS | Status: AC
  Filled 2020-05-11: qty 30

## 2020-05-11 MED ORDER — PANTOPRAZOLE SODIUM 40 MG IV SOLR: 40.0000 mg | Freq: Two times a day (BID) | INTRAVENOUS | Status: DC

## 2020-05-11 MED ORDER — MIDAZOLAM HCL 2 MG/2ML IJ SOLN
INTRAMUSCULAR | Status: DC | PRN
  Administered 2020-05-11: 2 mg via INTRAVENOUS

## 2020-05-11 MED ORDER — MIDAZOLAM HCL 2 MG/2ML IJ SOLN
INTRAMUSCULAR | Status: AC
  Filled 2020-05-11: qty 2

## 2020-05-11 MED ORDER — GLYCOPYRROLATE 0.2 MG/ML IJ SOLN
INTRAMUSCULAR | Status: DC | PRN
  Administered 2020-05-11: .2 mg via INTRAVENOUS

## 2020-05-11 MED ORDER — PHENYLEPHRINE HCL (PRESSORS) 10 MG/ML IV SOLN
INTRAVENOUS | Status: DC | PRN
  Administered 2020-05-11 (×2): 100 ug via INTRAVENOUS
  Administered 2020-05-11: 200 ug via INTRAVENOUS
  Administered 2020-05-11: 100 ug via INTRAVENOUS

## 2020-05-11 MED ORDER — GLYCOPYRROLATE 0.2 MG/ML IJ SOLN
INTRAMUSCULAR | Status: AC
  Filled 2020-05-11: qty 1

## 2020-05-11 MED ORDER — BUPIVACAINE-EPINEPHRINE (PF) 0.25% -1:200000 IJ SOLN
INTRAMUSCULAR | Status: DC | PRN
  Administered 2020-05-11: 30 mL

## 2020-05-11 MED ORDER — CEFAZOLIN SODIUM-DEXTROSE 2-3 GM-%(50ML) IV SOLR
INTRAVENOUS | Status: DC | PRN
  Administered 2020-05-11: 2 g via INTRAVENOUS

## 2020-05-11 MED ORDER — OXYCODONE HCL 5 MG PO TABS
5.0000 mg | ORAL_TABLET | ORAL | Status: DC | PRN
  Administered 2020-05-11: 5 mg via ORAL
  Filled 2020-05-11: qty 1

## 2020-05-11 MED ORDER — EPHEDRINE SULFATE 50 MG/ML IJ SOLN
INTRAMUSCULAR | Status: DC | PRN
  Administered 2020-05-11: 5 mg via INTRAVENOUS
  Administered 2020-05-11: 10 mg via INTRAVENOUS

## 2020-05-11 MED ORDER — EPHEDRINE 5 MG/ML INJ
INTRAVENOUS | Status: AC
  Filled 2020-05-11: qty 10

## 2020-05-11 MED ORDER — CEFAZOLIN SODIUM 1 G IJ SOLR
INTRAMUSCULAR | Status: AC
  Filled 2020-05-11: qty 20

## 2020-05-11 MED ORDER — DEXAMETHASONE SODIUM PHOSPHATE 10 MG/ML IJ SOLN
INTRAMUSCULAR | Status: DC | PRN
  Administered 2020-05-11: 10 mg via INTRAVENOUS

## 2020-05-11 MED ORDER — KETOROLAC TROMETHAMINE 30 MG/ML IJ SOLN
INTRAMUSCULAR | Status: AC
  Filled 2020-05-11: qty 1

## 2020-05-11 MED ORDER — FENTANYL CITRATE (PF) 100 MCG/2ML IJ SOLN
INTRAMUSCULAR | Status: DC | PRN
  Administered 2020-05-11: 100 ug via INTRAVENOUS
  Administered 2020-05-11 (×2): 25 ug via INTRAVENOUS

## 2020-05-11 SURGICAL SUPPLY — 56 items
ADH SKN CLS APL DERMABOND .7 (GAUZE/BANDAGES/DRESSINGS) ×1
APL PRP STRL LF DISP 70% ISPRP (MISCELLANEOUS) ×1
BAG SPEC RTRVL LRG 6X4 10 (ENDOMECHANICALS) ×1
BULB RESERV EVAC DRAIN JP 100C (MISCELLANEOUS) ×1 IMPLANT
CANISTER SUCT 1200ML W/VALVE (MISCELLANEOUS) ×2 IMPLANT
CANNULA REDUC XI 12-8 STAPL (CANNULA) ×2
CANNULA REDUCER 12-8 DVNC XI (CANNULA) ×1 IMPLANT
CHLORAPREP W/TINT 26 (MISCELLANEOUS) ×2 IMPLANT
CLIP VESOLOCK MED LG 6/CT (CLIP) ×2 IMPLANT
COVER WAND RF STERILE (DRAPES) ×2 IMPLANT
DECANTER SPIKE VIAL GLASS SM (MISCELLANEOUS) ×2 IMPLANT
DEFOGGER SCOPE WARMER CLEARIFY (MISCELLANEOUS) ×2 IMPLANT
DERMABOND ADVANCED (GAUZE/BANDAGES/DRESSINGS) ×1
DERMABOND ADVANCED .7 DNX12 (GAUZE/BANDAGES/DRESSINGS) ×1 IMPLANT
DRAIN CHANNEL JP 19F (MISCELLANEOUS) ×1 IMPLANT
DRAPE ARM DVNC X/XI (DISPOSABLE) ×4 IMPLANT
DRAPE COLUMN DVNC XI (DISPOSABLE) ×1 IMPLANT
DRAPE DA VINCI XI ARM (DISPOSABLE) ×8
DRAPE DA VINCI XI COLUMN (DISPOSABLE) ×2
ELECT CAUTERY BLADE 6.4 (BLADE) ×2 IMPLANT
ELECT REM PT RETURN 9FT ADLT (ELECTROSURGICAL) ×2
ELECTRODE REM PT RTRN 9FT ADLT (ELECTROSURGICAL) ×1 IMPLANT
GLOVE BIO SURGEON STRL SZ7 (GLOVE) ×4 IMPLANT
GOWN STRL REUS W/ TWL LRG LVL3 (GOWN DISPOSABLE) ×4 IMPLANT
GOWN STRL REUS W/TWL LRG LVL3 (GOWN DISPOSABLE) ×8
IRRIGATION STRYKERFLOW (MISCELLANEOUS) IMPLANT
IRRIGATOR STRYKERFLOW (MISCELLANEOUS) ×2
IV NS 1000ML (IV SOLUTION)
IV NS 1000ML BAXH (IV SOLUTION) IMPLANT
KIT PINK PAD W/HEAD ARE REST (MISCELLANEOUS) ×2
KIT PINK PAD W/HEAD ARM REST (MISCELLANEOUS) ×1 IMPLANT
LABEL OR SOLS (LABEL) ×2 IMPLANT
MANIFOLD NEPTUNE II (INSTRUMENTS) ×2 IMPLANT
NEEDLE HYPO 22GX1.5 SAFETY (NEEDLE) ×2 IMPLANT
NS IRRIG 500ML POUR BTL (IV SOLUTION) ×2 IMPLANT
OBTURATOR OPTICAL STANDARD 8MM (TROCAR) ×2
OBTURATOR OPTICAL STND 8 DVNC (TROCAR) ×1
OBTURATOR OPTICALSTD 8 DVNC (TROCAR) ×1 IMPLANT
PACK LAP CHOLECYSTECTOMY (MISCELLANEOUS) ×2 IMPLANT
PENCIL ELECTRO HAND CTR (MISCELLANEOUS) ×2 IMPLANT
POUCH SPECIMEN RETRIEVAL 10MM (ENDOMECHANICALS) ×2 IMPLANT
SEAL CANN UNIV 5-8 DVNC XI (MISCELLANEOUS) ×3 IMPLANT
SEAL XI 5MM-8MM UNIVERSAL (MISCELLANEOUS) ×6
SET TUBE SMOKE EVAC HIGH FLOW (TUBING) ×2 IMPLANT
SOLUTION ELECTROLUBE (MISCELLANEOUS) ×2 IMPLANT
SPONGE DRAIN TRACH 4X4 STRL 2S (GAUZE/BANDAGES/DRESSINGS) ×2 IMPLANT
SPONGE LAP 18X18 RF (DISPOSABLE) ×2 IMPLANT
SPONGE LAP 4X18 RFD (DISPOSABLE) IMPLANT
STAPLER CANNULA SEAL DVNC XI (STAPLE) ×1 IMPLANT
STAPLER CANNULA SEAL XI (STAPLE) ×2
SUT ETHILON 3-0 FS-10 30 BLK (SUTURE) ×2
SUT MNCRL AB 4-0 PS2 18 (SUTURE) ×2 IMPLANT
SUT VICRYL 0 AB UR-6 (SUTURE) ×4 IMPLANT
SUTURE EHLN 3-0 FS-10 30 BLK (SUTURE) IMPLANT
TAPE TRANSPORE STRL 2 31045 (GAUZE/BANDAGES/DRESSINGS) ×2 IMPLANT
TROCAR BALLN GELPORT 12X130M (ENDOMECHANICALS) ×2 IMPLANT

## 2020-05-11 NOTE — Anesthesia Preprocedure Evaluation (Signed)
Anesthesia Evaluation  Patient identified by MRN, date of birth, ID band Patient awake    Reviewed: Allergy & Precautions, NPO status , Patient's Chart, lab work & pertinent test results  History of Anesthesia Complications Negative for: history of anesthetic complications  Airway Mallampati: II  TM Distance: >3 FB Neck ROM: Full    Dental  (+) Poor Dentition   Pulmonary neg pulmonary ROS, neg sleep apnea, neg COPD,    breath sounds clear to auscultation- rhonchi (-) wheezing      Cardiovascular Exercise Tolerance: Good (-) hypertension(-) CAD, (-) Past MI, (-) Cardiac Stents and (-) CABG  Rhythm:Regular Rate:Normal - Systolic murmurs and - Diastolic murmurs    Neuro/Psych neg Seizures negative neurological ROS  negative psych ROS   GI/Hepatic Neg liver ROS, hiatal hernia, GERD  ,  Endo/Other  negative endocrine ROSneg diabetes  Renal/GU negative Renal ROS     Musculoskeletal  (+) Arthritis ,   Abdominal (+) - obese,   Peds  Hematology negative hematology ROS (+)   Anesthesia Other Findings Past Medical History: No date: Cataract 06/2019: COVID-19 virus infection No date: ED (erectile dysfunction) No date: GERD (gastroesophageal reflux disease)     Comment:  with hiatal hernia No date: H/O hiatal hernia No date: History of prostatitis No date: HLD (hyperlipidemia) 07/28/2015: Prediabetes No date: Shortness of breath     Comment:  exersion   Reproductive/Obstetrics                             Anesthesia Physical Anesthesia Plan  ASA: II  Anesthesia Plan: General   Post-op Pain Management:    Induction: Intravenous  PONV Risk Score and Plan: 1 and Ondansetron and Dexamethasone  Airway Management Planned: Oral ETT  Additional Equipment:   Intra-op Plan:   Post-operative Plan: Extubation in OR  Informed Consent: I have reviewed the patients History and Physical, chart,  labs and discussed the procedure including the risks, benefits and alternatives for the proposed anesthesia with the patient or authorized representative who has indicated his/her understanding and acceptance.     Dental advisory given  Plan Discussed with: CRNA and Anesthesiologist  Anesthesia Plan Comments:         Anesthesia Quick Evaluation

## 2020-05-11 NOTE — Anesthesia Postprocedure Evaluation (Signed)
Anesthesia Post Note  Patient: Mark Martinez  Procedure(s) Performed: XI ROBOTIC ASSISTED LAPAROSCOPIC CHOLECYSTECTOMY (N/A )  Patient location during evaluation: PACU Anesthesia Type: General Level of consciousness: awake and alert and oriented Pain management: pain level controlled Vital Signs Assessment: post-procedure vital signs reviewed and stable Respiratory status: spontaneous breathing, nonlabored ventilation and respiratory function stable Cardiovascular status: blood pressure returned to baseline and stable Postop Assessment: no signs of nausea or vomiting Anesthetic complications: no   No complications documented.   Last Vitals:  Vitals:   05/11/20 1039 05/11/20 1054  BP: 108/76 103/76  Pulse: 89 88  Resp: 16 11  Temp:  (!) 36.3 C  SpO2: 93% 94%    Last Pain:  Vitals:   05/11/20 1054  TempSrc:   PainSc: 0-No pain                 Tameika Heckmann

## 2020-05-11 NOTE — Discharge Summary (Signed)
[facility] SURGICAL DISCHARGE SUMMARY  Patient ID: Mark Martinez MRN: [id number] DOB/AGE: 1950-09-07 69 y.o.  Admit date: 05/10/2020 Discharge date: 05/11/2020  Discharge Diagnoses Patient Active Problem List   Diagnosis Date Noted  . Acute cholecystitis 05/10/2020    Consultants None  Procedures 05/11/2020:  Robotic Assisted laparoscopic cholecystectomy  HPI: Mark Martinez is a 69 y.o. male presents to the emergency room complaining of abdominal pain.  Patient says the pain started epigastric area and right upper quadrant.  It has been present since early in the morning.  He also has associated nausea and vomiting.  Pain is intermittent severe intensity and colicky in nature.  No fevers no chills no evidence of biliary obstruction.  Apparently about 2 days ago had a similar episode.  CT scan as well as ultrasound personally reviewed there is evidence of/and gallstones.  Normal common bile duct there is no definitive evidence of cholecystitis.  CBC and CMP were completely normal except mild ovation of the white count of 11.4.  He reports that the pain subsided with narcotics given in the emergency room but he still has the pain.  He is otherwise relatively healthy and is able to perform more than 4 METS of activity without any shortness of breath or chest pain. He denies any prior abdominal operations  Hospital Course: Informed consent was obtained and documented, and patient underwent uneventful robotic assisted laparoscopic cholecystectomy (Dr pabon, 05/11/2020).  Post-operatively, patient's pain improved/resolved and advancement of patient's diet and ambulation were well-tolerated. The remainder of patient's hospital course was essentially unremarkable, and discharge planning was initiated accordingly with patient safely able to be discharged home with appropriate discharge instructions, antibiotics (Augmentin x10 days), pain control, and outpatient follow-up after  all of his questions were answered to his expressed satisfaction.   Discharge Condition: good    Allergies as of 05/11/2020      Reactions   Codeine Other (See Comments)   Keeps him awake      Medication List    TAKE these medications   acetaminophen 325 MG tablet Commonly known as: TYLENOL Take 650 mg by mouth every 6 (six) hours as needed for moderate pain.   amoxicillin-clavulanate 875-125 MG tablet Commonly known as: Augmentin Take 1 tablet by mouth 2 (two) times daily for 10 days.   esomeprazole 40 MG capsule Commonly known as: NEXIUM Take 1 capsule (40 mg total) by mouth daily.   ibuprofen 800 MG tablet Commonly known as: ADVIL Take 1 tablet (800 mg total) by mouth every 8 (eight) hours as needed.   oxyCODONE 5 MG immediate release tablet Commonly known as: Oxy IR/ROXICODONE Take 1 tablet (5 mg total) by mouth every 6 (six) hours as needed for severe pain or breakthrough pain.   sildenafil 20 MG tablet Commonly known as: REVATIO TAKE 2 TO 4 TABLETS BY MOUTH 3 TIMES DAILY         Follow-up Information    Pabon, [place] F, MD. Schedule an appointment as soon as possible for a visit in 1 week(s).   Specialty: General Surgery Why: s/p lap chole, has drain Contact information: [address] [place] [address] [phone]                Time spent on discharge management including discussion of hospital course, clinical condition, outpatient instructions, prescriptions, and follow up with the patient and members of the medical team: >30 minutes  -- Edison Simon , PA-C [facility]  05/11/2020,  2:54 PM [phone] M-F: 7am - 4pm

## 2020-05-11 NOTE — Op Note (Signed)
Robotic assisted laparoscopic Cholecystectomy  Pre-operative Diagnosis: Acute cholecystitis  Post-operative Diagnosis: same  Procedure:  Robotic assisted laparoscopic Cholecystectomy  Surgeon: Caroleen Hamman, MD FACS  Anesthesia: Gen. with endotracheal tube  Findings: Acute severe calculus Cholecystitis   Estimated Blood Loss: 10 cc       Specimens: Gallbladder           Complications: none   Procedure Details  The patient was seen again in the Holding Room. The benefits, complications, treatment options, and expected outcomes were discussed with the patient. The risks of bleeding, infection, recurrence of symptoms, failure to resolve symptoms, bile duct damage, bile duct leak, retained common bile duct stone, bowel injury, any of which could require further surgery and/or ERCP, stent, or papillotomy were reviewed with the patient. The likelihood of improving the patient's symptoms with return to their baseline status is good.  The patient and/or family concurred with the proposed plan, giving informed consent.  The patient was taken to Operating Room, identified  and the procedure verified as Laparoscopic Cholecystectomy.  A Time Out was held and the above information confirmed.  Prior to the induction of general anesthesia, antibiotic prophylaxis was administered. VTE prophylaxis was in place. General endotracheal anesthesia was then administered and tolerated well. After the induction, the abdomen was prepped with Chloraprep and draped in the sterile fashion. The patient was positioned in the supine position.  Cut down technique was used to enter the abdominal cavity and a Hasson trochar was placed after two vicryl stitches were anchored to the fascia. Pneumoperitoneum was then created with CO2 and tolerated well without any adverse changes in the patient's vital signs.  Three 8-mm ports were placed under direct vision. All skin incisions  were infiltrated with a local anesthetic agent  before making the incision and placing the trocars.   The patient was positioned  in reverse Trendelenburg, robot was brought to the surgical field and docked in the standard fashion.  We made sure all the instrumentation was kept indirect view at all times and that there were no collision between the arms. I scrubbed out and went to the console.  The gallbladder was identified, the fundus grasped and retracted cephalad. GB was found   to be severely inflamed. Adhesions were lysed bluntly. The infundibulum was grasped and retracted laterally, exposing the peritoneum overlying the triangle of Calot. This was then divided and exposed in a blunt fashion. An extended critical view of the cystic duct and cystic artery was obtained.  The cystic duct was clearly identified and bluntly dissected.   Artery and duct were double clipped and divided. Using ICG cholangiography we visualize the cystic duct  And CBD , no evidence of bile injuries were observed. The gallbladder was taken from the gallbladder fossa in a retrograde fashion with the electrocautery.  Hemostasis was achieved with the electrocautery. nspection of the right upper quadrant was performed. No bleeding, bile duct injury or leak, or bowel injury was noted. I decided to place a 19 blake drain due to severe inflammation. Robotic instruments and robotic arms were undocked in the standard fashion.  I scrubbed back in.  The gallbladder was removed and placed in an Endocatch bag.   Pneumoperitoneum was released.  The periumbilical port site was closed with interrumpted 0 Vicryl sutures. 4-0 subcuticular Monocryl was used to close the skin. Dermabond was  applied.  The patient was then extubated and brought to the recovery room in stable condition. Sponge, lap, and needle counts were correct  at closure and at the conclusion of the case.               Caroleen Hamman, MD, FACS

## 2020-05-11 NOTE — Anesthesia Procedure Notes (Signed)
Procedure Name: Intubation Date/Time: 05/11/2020 8:38 AM Performed by: Hedda Slade, CRNA Pre-anesthesia Checklist: Patient identified, Patient being monitored, Timeout performed, Emergency Drugs available and Suction available Patient Re-evaluated:Patient Re-evaluated prior to induction Oxygen Delivery Method: Circle system utilized Preoxygenation: Pre-oxygenation with 100% oxygen Induction Type: IV induction Ventilation: Mask ventilation without difficulty and Oral airway inserted - appropriate to patient size Laryngoscope Size: 3 and McGraph Grade View: Grade I Tube type: Oral Tube size: 7.5 mm Number of attempts: 1 Airway Equipment and Method: Stylet and Video-laryngoscopy Placement Confirmation: ETT inserted through vocal cords under direct vision,  positive ETCO2 and breath sounds checked- equal and bilateral Secured at: 22 cm Tube secured with: Tape Dental Injury: Teeth and Oropharynx as per pre-operative assessment

## 2020-05-11 NOTE — Discharge Instructions (Signed)
In addition to included general post-operative instructions for laparoscopic cholecystectomy,  Diet: Resume home diet. Recommend limiting intake of fatty or greasy foods for the first few days after gallbladder surgery. You may (or may not) notice diarrhea with eating these foods while your body adjusts to not having a gallbladder. This resolves with time.   Activity: No heavy lifting >20 pounds (children, pets, laundry, garbage) for 4 weeks, but light activity and walking are encouraged. Do not drive or drink alcohol if taking narcotic pain medications or having pain that might distract from driving.  Wound care: IF YOU CAN KEEP DRAIN SITE DRY, 2 days after surgery (11/25), you may shower/get incision wet with soapy water and pat dry (do not rub incisions), but no baths or submerging incision underwater until follow-up. Monitor and record drain output daily.    Medications: Resume all home medications. For mild to moderate pain: acetaminophen (Tylenol) or ibuprofen/naproxen (if no kidney disease). Combining Tylenol with alcohol can substantially increase your risk of causing liver disease. Narcotic pain medications, if prescribed, can be used for severe pain, though may cause nausea, constipation, and drowsiness. Do not combine Tylenol and Percocet (or similar) within a 6 hour period as Percocet (and similar) contain(s) Tylenol. If you do not need the narcotic pain medication, you do not need to fill the prescription.  Call office [phone] / [phone]) at any time if any questions, worsening pain, fevers/chills, bleeding, drainage from incision site, or other concerns.

## 2020-05-11 NOTE — Transfer of Care (Signed)
Immediate Anesthesia Transfer of Care Note  Patient: Mark Martinez  Procedure(s) Performed: XI ROBOTIC ASSISTED LAPAROSCOPIC CHOLECYSTECTOMY (N/A )  Patient Location: PACU  Anesthesia Type:General  Level of Consciousness: sedated  Airway & Oxygen Therapy: Patient Spontanous Breathing and Patient connected to face mask oxygen  Post-op Assessment: Report given to RN and Post -op Vital signs reviewed and stable  Post vital signs: Reviewed and stable  Last Vitals:  Vitals Value Taken Time  BP 117/78 05/11/20 1023  Temp 36.4 C 05/11/20 1023  Pulse 78 05/11/20 1023  Resp 17 05/11/20 1023  SpO2 100 % 05/11/20 1023  Vitals shown include unvalidated device data.  Last Pain:  Vitals:   05/11/20 1023  TempSrc:   PainSc: 0-No pain      Patients Stated Pain Goal: 0 (18/20/99 0689)  Complications: No complications documented.

## 2020-05-13 LAB — SURGICAL PATHOLOGY

## 2020-05-18 ENCOUNTER — Other Ambulatory Visit: Payer: Self-pay | Admitting: Family Medicine

## 2020-05-18 ENCOUNTER — Encounter: Payer: Self-pay | Admitting: Surgery

## 2020-05-18 ENCOUNTER — Other Ambulatory Visit: Payer: Self-pay

## 2020-05-18 ENCOUNTER — Ambulatory Visit (INDEPENDENT_AMBULATORY_CARE_PROVIDER_SITE_OTHER): Payer: Medicare HMO | Admitting: Surgery

## 2020-05-18 VITALS — BP 118/78 | HR 88 | Temp 97.5°F | Ht 72.5 in | Wt 206.0 lb

## 2020-05-18 DIAGNOSIS — Z09 Encounter for follow-up examination after completed treatment for conditions other than malignant neoplasm: Secondary | ICD-10-CM

## 2020-05-18 NOTE — Patient Instructions (Addendum)
You may stop your antibiotics. Drink plenty of fluids including, water and gatorade. You may resume activity as your body will allow. May resume exercise next week. 2 week virtual follow up has been scheduled. See appointment below. If you have any concerns or questions, please call our office.   Dehydration, Adult Dehydration is condition in which there is not enough water or other fluids in the body. This happens when a person loses more fluids than he or she takes in. Important body parts cannot work right without the right amount of fluids. Any loss of fluids from the body can cause dehydration. Dehydration can be mild, worse, or very bad. It should be treated right away to keep it from getting very bad. What are the causes? This condition may be caused by:  Conditions that cause loss of water or other fluids, such as: ? Watery poop (diarrhea). ? Vomiting. ? Sweating a lot. ? Peeing (urinating) a lot.  Not drinking enough fluids, especially when you: ? Are ill. ? Are doing things that take a lot of energy to do.  Other illnesses and conditions, such as fever or infection.  Certain medicines, such as medicines that take extra fluid out of the body (diuretics).  Lack of safe drinking water.  Not being able to get enough water and food. What increases the risk? The following factors may make you more likely to develop this condition:  Having a long-term (chronic) illness that has not been treated the right way, such as: ? Diabetes. ? Heart disease. ? Kidney disease.  Being 56 years of age or older.  Having a disability.  Living in a place that is high above the ground or sea (high in altitude). The thinner, dried air causes more fluid loss.  Doing exercises that put stress on your body for a long time. What are the signs or symptoms? Symptoms of dehydration depend on how bad it is. Mild or worse dehydration  Thirst.  Dry lips or dry mouth.  Feeling dizzy or  light-headed, especially when you stand up from sitting.  Muscle cramps.  Your body making: ? Dark pee (urine). Pee may be the color of tea. ? Less pee than normal. ? Less tears than normal.  Headache. Very bad dehydration  Changes in skin. Skin may: ? Be cold to the touch (clammy). ? Be blotchy or pale. ? Not go back to normal right after you lightly pinch it and let it go.  Little or no tears, pee, or sweat.  Changes in vital signs, such as: ? Fast breathing. ? Low blood pressure. ? Weak pulse. ? Pulse that is more than 100 beats a minute when you are sitting still.  Other changes, such as: ? Feeling very thirsty. ? Eyes that look hollow (sunken). ? Cold hands and feet. ? Being mixed up (confused). ? Being very tired (lethargic) or having trouble waking from sleep. ? Short-term weight loss. ? Loss of consciousness. How is this treated? Treatment for this condition depends on how bad it is. Treatment should start right away. Do not wait until your condition gets very bad. Very bad dehydration is an emergency. You will need to go to a hospital.  Mild or worse dehydration can be treated at home. You may be asked to: ? Drink more fluids. ? Drink an oral rehydration solution (ORS). This drink helps get the right amounts of fluids and salts and minerals in the blood (electrolytes).  Very bad dehydration can be treated: ?  With fluids through an IV tube. ? By getting normal levels of salts and minerals in your blood. This is often done by giving salts and minerals through a tube. The tube is passed through your nose and into your stomach. ? By treating the root cause. Follow these instructions at home: Oral rehydration solution If told by your doctor, drink an ORS:  Make an ORS. Use instructions on the package.  Start by drinking small amounts, about  cup (120 mL) every 5-10 minutes.  Slowly drink more until you have had the amount that your doctor said to have. Eating  and drinking         Drink enough clear fluid to keep your pee pale yellow. If you were told to drink an ORS, finish the ORS first. Then, start slowly drinking other clear fluids. Drink fluids such as: ? Water. Do not drink only water. Doing that can make the salt (sodium) level in your body get too low. ? Water from ice chips you suck on. ? Fruit juice that you have added water to (diluted). ? Low-calorie sports drinks.  Eat foods that have the right amounts of salts and minerals, such as: ? Bananas. ? Oranges. ? Potatoes. ? Tomatoes. ? Spinach.  Do not drink alcohol.  Avoid: ? Drinks that have a lot of sugar. These include:  High-calorie sports drinks.  Fruit juice that you did not add water to.  Soda.  Caffeine. ? Foods that are greasy or have a lot of fat or sugar. General instructions  Take over-the-counter and prescription medicines only as told by your doctor.  Do not take salt tablets. Doing that can make the salt level in your body get too high.  Return to your normal activities as told by your doctor. Ask your doctor what activities are safe for you.  Keep all follow-up visits as told by your doctor. This is important. Contact a doctor if:  You have pain in your belly (abdomen) and the pain: ? Gets worse. ? Stays in one place.  You have a rash.  You have a stiff neck.  You get angry or annoyed (irritable) more easily than normal.  You are more tired or have a harder time waking than normal.  You feel: ? Weak or dizzy. ? Very thirsty. Get help right away if you have:  Any symptoms of very bad dehydration.  Symptoms of vomiting, such as: ? You cannot eat or drink without vomiting. ? Your vomiting gets worse or does not go away. ? Your vomit has blood or green stuff in it.  Symptoms that get worse with treatment.  A fever.  A very bad headache.  Problems with peeing or pooping (having a bowel movement), such as: ? Watery poop that gets  worse or does not go away. ? Blood in your poop (stool). This may cause poop to look black and tarry. ? Not peeing in 6-8 hours. ? Peeing only a small amount of very dark pee in 6-8 hours.  Trouble breathing. These symptoms may be an emergency. Do not wait to see if the symptoms will go away. Get medical help right away. Call your local emergency services (911 in the [place] Do not drive yourself to the hospital. Summary  Dehydration is a condition in which there is not enough water or other fluids in the body. This happens when a person loses more fluids than he or she takes in.  Treatment for this condition depends on how bad  it is. Treatment should be started right away. Do not wait until your condition gets very bad.  Drink enough clear fluid to keep your pee pale yellow. If you were told to drink an oral rehydration solution (ORS), finish the ORS first. Then, start slowly drinking other clear fluids.  Take over-the-counter and prescription medicines only as told by your doctor.  Get help right away if you have any symptoms of very bad dehydration. This information is not intended to replace advice given to you by your health care provider. Make sure you discuss any questions you have with your health care provider. Document Revised: 01/15/2019 Document Reviewed: 01/15/2019 Elsevier Patient Education  [facility] PATIENT INSTRUCTIONS   WOUND CARE INSTRUCTIONS:  Keep a dry clean dressing on the wound if there is drainage. The initial bandage may be removed after 24 hours.  Once the wound has quit draining you may leave it open to air.  If clothing rubs against the wound or causes irritation and the wound is not draining you may cover it with a dry dressing during the daytime.  Try to keep the wound dry and avoid ointments on the wound unless directed to do so.  If the wound becomes bright red and painful or starts to drain infected material that is  not clear, please contact your physician immediately.  If the wound is mildly pink and has a thick firm ridge underneath it, this is normal, and is referred to as a healing ridge.  This will resolve over the next 4-6 weeks.  BATHING: You may shower if you have been informed of this by your surgeon. However, Please do not submerge in a tub, hot tub, or pool until incisions are completely sealed or have been told by your surgeon that you may do so.  DIET:  You may eat any foods that you can tolerate.  It is a good idea to eat a high fiber diet and take in plenty of fluids to prevent constipation.  If you do become constipated you may want to take a mild laxative or take ducolax tablets on a daily basis until your bowel habits are regular.  Constipation can be very uncomfortable, along with straining, after recent surgery.  ACTIVITY:  You are encouraged to cough and deep breath or use your incentive spirometer if you were given one, every 15-30 minutes when awake.  This will help prevent respiratory complications and low grade fevers post-operatively if you had a general anesthetic.  You may want to hug a pillow when coughing and sneezing to add additional support to the surgical area, if you had abdominal or chest surgery, which will decrease pain during these times.  You are encouraged to walk and engage in light activity for the next two weeks.  You should not lift more than 20 pounds, until 06/22/2020 as it could put you at increased risk for complications.  Twenty pounds is roughly equivalent to a plastic bag of groceries. At that time- Listen to your body when lifting, if you have pain when lifting, stop and then try again in a few days. Soreness after doing exercises or activities of daily living is normal as you get back in to your normal routine.  MEDICATIONS:  Try to take narcotic medications and anti-inflammatory medications, such as tylenol, ibuprofen, naprosyn, etc., with food.  This will minimize  stomach upset from the medication.  Should you develop nausea and vomiting from the pain  medication, or develop a rash, please discontinue the medication and contact your physician.  You should not drive, make important decisions, or operate machinery when taking narcotic pain medication.  SUNBLOCK Use sun block to incision area over the next year if this area will be exposed to sun. This helps decrease scarring and will allow you avoid a permanent darkened area over your incision.  QUESTIONS:  Please feel free to call our office if you have any questions, and we will be glad to assist you. [phone]

## 2020-05-19 NOTE — Progress Notes (Signed)
Status post robotic cholecystectomy and drain placement.  Doing better.  No fevers no chills.  He is sore.  Minimal output from the drain.  Pathology discussed with the patient in detail.  Patient having increasing frequency in bowel movements  PE NAD Abd: Drain with serous output removed.  Other incisions healing well without infection.   A/p overall doing well.  Expected postoperative soreness.  He dates that he had a feels significant better as compared to hospitalization.  May do 1 final virtual visit in a couple weeks. May stop antibiotics No clinical evidence of  complications

## 2020-05-20 DIAGNOSIS — H0100A Unspecified blepharitis right eye, upper and lower eyelids: Secondary | ICD-10-CM | POA: Diagnosis not present

## 2020-05-20 DIAGNOSIS — H2513 Age-related nuclear cataract, bilateral: Secondary | ICD-10-CM | POA: Diagnosis not present

## 2020-05-24 DIAGNOSIS — M25611 Stiffness of right shoulder, not elsewhere classified: Secondary | ICD-10-CM | POA: Diagnosis not present

## 2020-05-24 DIAGNOSIS — M25511 Pain in right shoulder: Secondary | ICD-10-CM | POA: Diagnosis not present

## 2020-05-26 DIAGNOSIS — M25511 Pain in right shoulder: Secondary | ICD-10-CM | POA: Diagnosis not present

## 2020-05-26 DIAGNOSIS — M25611 Stiffness of right shoulder, not elsewhere classified: Secondary | ICD-10-CM | POA: Diagnosis not present

## 2020-05-31 DIAGNOSIS — M25611 Stiffness of right shoulder, not elsewhere classified: Secondary | ICD-10-CM | POA: Diagnosis not present

## 2020-05-31 DIAGNOSIS — M25511 Pain in right shoulder: Secondary | ICD-10-CM | POA: Diagnosis not present

## 2020-06-01 ENCOUNTER — Encounter: Payer: Medicare HMO | Admitting: Surgery

## 2020-06-02 ENCOUNTER — Other Ambulatory Visit: Payer: Self-pay

## 2020-06-02 ENCOUNTER — Encounter: Payer: Self-pay | Admitting: Physician Assistant

## 2020-06-02 ENCOUNTER — Ambulatory Visit (INDEPENDENT_AMBULATORY_CARE_PROVIDER_SITE_OTHER): Payer: Medicare HMO | Admitting: Physician Assistant

## 2020-06-02 VITALS — BP 103/67 | HR 78 | Temp 97.8°F | Ht 77.0 in | Wt 208.4 lb

## 2020-06-02 DIAGNOSIS — K810 Acute cholecystitis: Secondary | ICD-10-CM

## 2020-06-02 DIAGNOSIS — M25611 Stiffness of right shoulder, not elsewhere classified: Secondary | ICD-10-CM | POA: Diagnosis not present

## 2020-06-02 DIAGNOSIS — Z09 Encounter for follow-up examination after completed treatment for conditions other than malignant neoplasm: Secondary | ICD-10-CM

## 2020-06-02 DIAGNOSIS — M25511 Pain in right shoulder: Secondary | ICD-10-CM | POA: Diagnosis not present

## 2020-06-02 NOTE — Progress Notes (Signed)
[facility] POST-OP OFFICE VISIT  06/02/2020  HPI: Mark Martinez is a 69 y.o. male 22 days s/p robotic assisted laparoscopic cholecystectomy with [name]  He is overall doing well His biggest complaint is that he has noticed loose stools almost every morning, about 3 each morning. He notes that this is occasionally exacerbated with fatty foods. He tried a single dose of Pepto without improvement Otherwise, he denied any abdominal pain, nausea, emesis.  He is tolerating PO but with decreased appetite No issues with incisions  Vital signs: BP 103/67   Pulse 78   Temp 97.8 F (36.6 C)   Ht 6\' 5"  (1.956 m)   Wt 208 lb 6.4 oz (94.5 kg)   SpO2 97%   BMI 24.71 kg/m    Physical Exam: Constitutional: Well appearing male, NAD Abdomen: Soft, non-tender, non-distended, o rebound/guarding Skin: Laparoscopic incisions are well healed without erythema or drainage   Assessment/Plan: This is a 69 y.o. male  22 days s/p robotic assisted laparoscopic cholecystectomy   - Recommend starting imodium 2mg  BID - TID as needed for diarrhea. Reviewed etiology of diarrhea in cholecystectomy patients  - Pain control prn  - Reviewed lifting restrictions  - Reviewed surgical pathology: Acute on Chronic cholecystitis   - He will call if concerns or not getting better, o/w will follow up prn   -- Edison Simon, PA-C  [facility] 06/02/2020, 11:03 AM [phone] M-F: 7am - 4pm

## 2020-06-02 NOTE — Patient Instructions (Addendum)
Take Imodium, 2 mg twice a day. Once in the morning and once at night. If you feel like you are getting constipated back off and only use once a day. You may also use more than twice a day if needed.  Be sure to drink plenty of water.   You body is still adjusting to not having a gallbladder and this may take a few months to work out.   You appetite will improve with time.   You may do activities as tolerated.   Follow-up with our office as needed.  Please call and ask to speak with a nurse if you develop questions or concerns.

## 2020-06-08 ENCOUNTER — Other Ambulatory Visit: Payer: Self-pay | Admitting: Family Medicine

## 2020-06-09 DIAGNOSIS — M25511 Pain in right shoulder: Secondary | ICD-10-CM | POA: Diagnosis not present

## 2020-06-09 DIAGNOSIS — M25611 Stiffness of right shoulder, not elsewhere classified: Secondary | ICD-10-CM | POA: Diagnosis not present

## 2020-06-12 ENCOUNTER — Other Ambulatory Visit: Payer: Self-pay | Admitting: Family Medicine

## 2020-06-12 DIAGNOSIS — E785 Hyperlipidemia, unspecified: Secondary | ICD-10-CM

## 2020-06-12 DIAGNOSIS — Z125 Encounter for screening for malignant neoplasm of prostate: Secondary | ICD-10-CM

## 2020-06-12 DIAGNOSIS — R7303 Prediabetes: Secondary | ICD-10-CM

## 2020-06-15 ENCOUNTER — Other Ambulatory Visit (INDEPENDENT_AMBULATORY_CARE_PROVIDER_SITE_OTHER): Payer: Medicare HMO

## 2020-06-15 ENCOUNTER — Ambulatory Visit (INDEPENDENT_AMBULATORY_CARE_PROVIDER_SITE_OTHER): Payer: Medicare HMO

## 2020-06-15 ENCOUNTER — Other Ambulatory Visit: Payer: Self-pay

## 2020-06-15 DIAGNOSIS — E785 Hyperlipidemia, unspecified: Secondary | ICD-10-CM | POA: Diagnosis not present

## 2020-06-15 DIAGNOSIS — Z125 Encounter for screening for malignant neoplasm of prostate: Secondary | ICD-10-CM

## 2020-06-15 DIAGNOSIS — R7303 Prediabetes: Secondary | ICD-10-CM | POA: Diagnosis not present

## 2020-06-15 DIAGNOSIS — Z0000 Encounter for general adult medical examination without abnormal findings: Secondary | ICD-10-CM | POA: Diagnosis not present

## 2020-06-15 LAB — LIPID PANEL
Cholesterol: 162 mg/dL (ref 0–200)
HDL: 48.8 mg/dL (ref >39.00)
LDL Cholesterol: 95 mg/dL (ref 0–99)
NonHDL: 113.11
Total CHOL/HDL Ratio: 3
Triglycerides: 89 mg/dL (ref 0.0–149.0)
VLDL: 17.8 mg/dL (ref 0.0–40.0)

## 2020-06-15 LAB — COMPREHENSIVE METABOLIC PANEL
ALT: 57 U/L — ABNORMAL HIGH (ref 0–53)
AST: 49 U/L — ABNORMAL HIGH (ref 0–37)
Albumin: 4.1 g/dL (ref 3.5–5.2)
Alkaline Phosphatase: 126 U/L — ABNORMAL HIGH (ref 39–117)
BUN: 16 mg/dL (ref 6–23)
CO2: 29 mEq/L (ref 19–32)
Calcium: 9 mg/dL (ref 8.4–10.5)
Chloride: 102 mEq/L (ref 96–112)
Creatinine, Ser: 1.11 mg/dL (ref 0.40–1.50)
GFR: 68.4 mL/min (ref >60.00)
Glucose, Bld: 107 mg/dL — ABNORMAL HIGH (ref 70–99)
Potassium: 4.4 mEq/L (ref 3.5–5.1)
Sodium: 139 mEq/L (ref 135–145)
Total Bilirubin: 0.6 mg/dL (ref 0.2–1.2)
Total Protein: 6.6 g/dL (ref 6.0–8.3)

## 2020-06-15 LAB — PSA: PSA: 0.2 ng/mL (ref 0.10–4.00)

## 2020-06-15 LAB — HEMOGLOBIN A1C: Hgb A1c MFr Bld: 5.9 % (ref 4.6–6.5)

## 2020-06-15 NOTE — Progress Notes (Signed)
oth   Subjective:   Mark Martinez is a 69 y.o. male who presents for Medicare Annual/Subsequent preventive examination.  Review of Systems: N/A      I connected with the patient today by telephone and verified that I am speaking with the correct person using two identifiers. Location patient: home Location nurse: work Persons participating in the telephone visit: patient, nurse.   I discussed the limitations, risks, security and privacy concerns of performing an evaluation and management service by telephone and the availability of in person appointments. I also discussed with the patient that there may be a patient responsible charge related to this service. The patient expressed understanding and verbally consented to this telephonic visit.        Cardiac Risk Factors include: advanced age (>54men, >67 women);male gender;Other (see comment), Risk factor comments: hyperlipidemia     Objective:    Today's Vitals   There is no height or weight on file to calculate BMI.  Advanced Directives 06/15/2020 05/10/2020 05/27/2019 08/26/2013  Does Patient Have a Medical Advance Directive? Yes Yes Yes Patient would not like information;Patient does not have advance directive  Type of Advance Directive Healthcare Power of [place];Living will Living will Living will -  Does patient want to make changes to medical advance directive? - No - Patient declined - -  Copy of Healthcare Power of Attorney in Chart? No - copy requested - - -    Current Medications (verified) Outpatient Encounter Medications as of 06/15/2020  Medication Sig   acetaminophen (TYLENOL) 325 MG tablet Take 650 mg by mouth every 6 (six) hours as needed for moderate pain.    esomeprazole (NEXIUM) 40 MG capsule TAKE 1 CAPSULE BY MOUTH EVERY DAY   FLUAD QUADRIVALENT 0.5 ML injection    ibuprofen (ADVIL) 800 MG tablet Take 1 tablet (800 mg total) by mouth every 8 (eight) hours as needed. (Patient not taking: Reported on  06/15/2020)   Facility-Administered Encounter Medications as of 06/15/2020  Medication   0.9 %  sodium chloride infusion    Allergies (verified) Codeine   History: Past Medical History:  Diagnosis Date   Cataract    COVID-19 virus infection 06/2019   ED (erectile dysfunction)    GERD (gastroesophageal reflux disease)    with hiatal hernia   H/O hiatal hernia    History of prostatitis    HLD (hyperlipidemia)    Prediabetes 07/28/2015   Shortness of breath    exersion   Past Surgical History:  Procedure Laterality Date   BACK SURGERY  1994   lumbar area   COLONOSCOPY  2005   WNL, rpt 10 yrs   COLONOSCOPY  07/2019   1 TA, hemorrhoids, rpt 7 yrs Christella Hartigan)   CYST REMOVAL NECK Left 08/31/2013   Procedure: EXCISION EPIDERMAL INCLUSION CYST LEFT NECK;  Surgeon: Liz Malady, MD   ESOPHAGEAL DILATION     three last   ESOPHAGOGASTRODUODENOSCOPY  12-27-1996   hiatal hernia (Dr. Jarold Motto)   SHOULDER ARTHROSCOPY WITH ROTATOR CUFF REPAIR AND SUBACROMIAL DECOMPRESSION Right 01/2020   distal clavicle resection, RTC repair (Supple)   Family History  Problem Relation Age of Onset   Valvular heart disease Mother    Ulcers Father    CAD Father 19       multiple stents   CAD Paternal Uncle    Cancer Neg Hx    Diabetes Neg Hx    Stroke Neg Hx    Social History   Socioeconomic History  Marital status: Married    Spouse name: Not on file   Number of children: 3   Years of education: Not on file   Highest education level: Not on file  Occupational History   Occupation: seeding and landscaping services    Employer: OTHER  Tobacco Use   Smoking status: Never Smoker   Smokeless tobacco: Former Network engineer and Sexual Activity   Alcohol use: No    Alcohol/week: 0.0 standard drinks   Drug use: No   Sexual activity: Not on file  Other Topics Concern   Not on file  Social History Narrative   Caffeine: rare   Lives with wife, 1 dog.    Grown children   Occupation: Works in Biomedical scientist.   Activity: stays active at work   Diet: daily fruits/vegetables, some water   Social Determinants of Health   Financial Resource Strain: Low Risk    Difficulty of Paying Living Expenses: Not hard at all  Food Insecurity: No Food Insecurity   Worried About Charity fundraiser in the Last Year: Never true   Arboriculturist in the Last Year: Never true  Transportation Needs: No Transportation Needs   Lack of Transportation (Medical): No   Lack of Transportation (Non-Medical): No  Physical Activity: Inactive   Days of Exercise per Week: 0 days   Minutes of Exercise per Session: 0 min  Stress: No Stress Concern Present   Feeling of Stress : Not at all  Social Connections: Not on file    Tobacco Counseling Counseling given: Not Answered   Clinical Intake:  Pre-visit preparation completed: Yes  Pain : No/denies pain     Nutritional Risks: None Diabetes: No  How often do you need to have someone help you when you read instructions, pamphlets, or other written materials from your doctor or pharmacy?: 1 - Never What is the last grade level you completed in school?: 12th  Diabetic: No Nutrition Risk Assessment:  Has the patient had any N/V/D within the last 2 months?  No  Does the patient have any non-healing wounds?  No  Has the patient had any unintentional weight loss or weight gain?  No   Diabetes:  Is the patient diabetic?  No  If diabetic, was a CBG obtained today?  N/A Did the patient bring in their glucometer from home?  N/A How often do you monitor your CBG's? N/A.   Financial Strains and Diabetes Management:  Are you having any financial strains with the device, your supplies or your medication? N/A.  Does the patient want to be seen by Chronic Care Management for management of their diabetes?  N/A Would the patient like to be referred to a Nutritionist or for Diabetic Management?   N/A    Interpreter Needed?: No  Information entered by :: CJohnson, LPN   Activities of Daily Living In your present state of health, do you have any difficulty performing the following activities: 06/15/2020 05/10/2020  Hearing? N N  Vision? N N  Difficulty concentrating or making decisions? N N  Walking or climbing stairs? N N  Dressing or bathing? N N  Doing errands, shopping? N N  Preparing Food and eating ? N -  Using the Toilet? N -  In the past six months, have you accidently leaked urine? N -  Do you have problems with loss of bowel control? N -  Managing your Medications? N -  Managing your Finances? N -  Housekeeping or managing  your Housekeeping? N -  Some recent data might be hidden    Patient Care Team: Ria Bush, MD as PCP - General  Indicate any recent Medical Services you may have received from other than [facility] providers in the past year (date may be approximate).     Assessment:   This is a routine wellness examination for [place].  Hearing/Vision screen  Hearing Screening   125Hz  250Hz  500Hz  1000Hz  2000Hz  3000Hz  4000Hz  6000Hz  8000Hz   Right ear:           Left ear:           Vision Screening Comments: Patient gets annual eye exams   Dietary issues and exercise activities discussed: Current Exercise Habits: The patient does not participate in regular exercise at present, Exercise limited by: None identified  Goals     Patient Stated     06/15/2020, I will maintain and continue medications as prescribed.       Depression Screen PHQ 2/9 Scores 06/15/2020 04/22/2019 04/15/2018 04/01/2017  PHQ - 2 Score 0 0 0 0  PHQ- 9 Score 0 - - -    Fall Risk Fall Risk  06/15/2020 05/18/2020 04/22/2019 04/15/2018  Falls in the past year? 0 0 0 No  Number falls in past yr: 0 - - -  Injury with Fall? 0 - - -  Risk for fall due to : No Fall Risks - - -  Follow up Falls evaluation completed;Falls prevention discussed - - -    FALL RISK PREVENTION  PERTAINING TO THE HOME:  Any stairs in or around the home? Yes  If so, are there any without handrails? No  Home free of loose throw rugs in walkways, pet beds, electrical cords, etc? Yes  Adequate lighting in your home to reduce risk of falls? Yes   ASSISTIVE DEVICES UTILIZED TO PREVENT FALLS:  Life alert? No  Use of a cane, walker or w/c? No  Grab bars in the bathroom? No  Shower chair or bench in shower? No  Elevated toilet seat or a handicapped toilet? No   TIMED UP AND GO:  Was the test performed? N/A, telephone visit .    Cognitive Function: MMSE - Mini Mental State Exam 06/15/2020  Orientation to time 5  Orientation to Place 5  Registration 3  Attention/ Calculation 5  Recall 3  Language- repeat 1       Mini Cog  Mini-Cog screen was completed. Maximum score is 22. A value of 0 denotes this part of the MMSE was not completed or the patient failed this part of the Mini-Cog screening.  Immunizations Immunization History  Administered Date(s) Administered   Fluad Quad(high Dose 65+) 03/12/2019, 04/10/2020   Influenza Whole 05/19/2009   Influenza, High Dose Seasonal PF 06/04/2017, 03/21/2018   Influenza, Seasonal, Injecte, Preservative Fre 06/20/2012   Influenza,inj,Quad PF,6+ Mos 03/08/2015, 04/18/2016   Influenza,inj,quad, With Preservative 03/19/2019, 04/06/2020   Influenza-Unspecified 04/14/2013   PFIZER SARS-COV-2 Vaccination 09/17/2019, 10/17/2019   Pneumococcal Polysaccharide-23 04/22/2019   Td 07/07/2002   Tdap 06/20/2012, 05/27/2019    TDAP status: Up to date  Flu Vaccine status: Up to date  Pneumococcal vaccine status: Due, Education has been provided regarding the importance of this vaccine. Advised may receive this vaccine at local pharmacy or Health Dept. Aware to provide a copy of the vaccination record if obtained from local pharmacy or Health Dept. Verbalized acceptance and understanding.  Covid-19 vaccine status: completed first 2  vaccines, due for booster  Qualifies for Shingles Vaccine? Yes   Zostavax completed No   Shingrix Completed?: No.    Education has been provided regarding the importance of this vaccine. Patient has been advised to call insurance company to determine out of pocket expense if they have not yet received this vaccine. Advised may also receive vaccine at local pharmacy or Health Dept. Verbalized acceptance and understanding.  Screening Tests Health Maintenance  Topic Date Due   COVID-19 Vaccine (3 - Booster for Pfizer series) 04/18/2020   PNA vac Low Risk Adult (2 of 2 - PCV13) 04/21/2020   COLONOSCOPY (Pts 45-10yrs Insurance coverage will need to be confirmed)  07/28/2026   TETANUS/TDAP  05/26/2029   INFLUENZA VACCINE  Completed   Hepatitis C Screening  Completed    Health Maintenance  Health Maintenance Due  Topic Date Due   COVID-19 Vaccine (3 - Booster for Pfizer series) 04/18/2020   PNA vac Low Risk Adult (2 of 2 - PCV13) 04/21/2020    Colorectal cancer screening: Type of screening: Colonoscopy. Completed 07/29/2019. Repeat every 7 years  Lung Cancer Screening: (Low Dose CT Chest recommended if Age 43-80 years, 30 pack-year currently smoking OR have quit w/in 15years.) does not qualify.    Additional Screening:  Hepatitis C Screening: does qualify; Completed 03/23/2016  Vision Screening: Recommended annual ophthalmology exams for early detection of glaucoma and other disorders of the eye. Is the patient up to date with their annual eye exam?  Yes  Who is the provider or what is the name of the office in which the patient attends annual eye exams? [facility]  If pt is not established with a provider, would they like to be referred to a provider to establish care? No .   Dental Screening: Recommended annual dental exams for proper oral hygiene  Community Resource Referral / Chronic Care Management: CRR required this visit?  No   CCM required this visit?  No       Plan:     I have personally reviewed and noted the following in the patients chart:    Medical and social history  Use of alcohol, tobacco or illicit drugs   Current medications and supplements  Functional ability and status  Nutritional status  Physical activity  Advanced directives  List of other physicians  Hospitalizations, surgeries, and ER visits in previous 12 months  Vitals  Screenings to include cognitive, depression, and falls  Referrals and appointments  In addition, I have reviewed and discussed with patient certain preventive protocols, quality metrics, and best practice recommendations. A written personalized care plan for preventive services as well as general preventive health recommendations were provided to patient.   Due to this being a telephonic visit, the after visit summary with patients personalized plan was offered to patient via office or my-chart. Patient preferred to pick up at office at next visit or via mychart.   Janalyn Shy, LPN   [id number]

## 2020-06-15 NOTE — Progress Notes (Signed)
PCP notes:  Health Maintenance: P13- due Covid- booster due   Abnormal Screenings: none   Patient concerns: none   Nurse concerns: none   Next PCP appt.: 06/20/2020 @ 3 pm

## 2020-06-15 NOTE — Patient Instructions (Signed)
Mr. Mark Martinez , Thank you for taking time to come for your Medicare Wellness Visit. I appreciate your ongoing commitment to your health goals. Please review the following plan we discussed and let me know if I can assist you in the future.   Screening recommendations/referrals: Colonoscopy: Up to date, completed 07/29/2019, due 07/2026 Recommended yearly ophthalmology/optometry visit for glaucoma screening and checkup Recommended yearly dental visit for hygiene and checkup  Vaccinations: Influenza vaccine: Up to date, completed 04/06/2020, due 01/2021 Pneumococcal vaccine: due, will get at physical Tdap vaccine: Up to date, completed 05/27/2019, due 05/2029 Shingles vaccine: due, check with your insurance regarding coverage if interested    Covid-19: completed first 2 vaccines, due for booster- please get as soon as possible   Advanced directives: Please bring a copy of your POA (Power of [place]) and/or Living Will to your next appointment.   Conditions/risks identified: hyperlipidemia  Next appointment: Follow up in one year for your annual wellness visit.   Preventive Care 68 Years and Older, Male Preventive care refers to lifestyle choices and visits with your health care provider that can promote health and wellness. What does preventive care include?  A yearly physical exam. This is also called an annual well check.  Dental exams once or twice a year.  Routine eye exams. Ask your health care provider how often you should have your eyes checked.  Personal lifestyle choices, including:  Daily care of your teeth and gums.  Regular physical activity.  Eating a healthy diet.  Avoiding tobacco and drug use.  Limiting alcohol use.  Practicing safe sex.  Taking low doses of aspirin every day.  Taking vitamin and mineral supplements as recommended by your health care provider. What happens during an annual well check? The services and screenings done by your health care  provider during your annual well check will depend on your age, overall health, lifestyle risk factors, and family history of disease. Counseling  Your health care provider may ask you questions about your:  Alcohol use.  Tobacco use.  Drug use.  Emotional well-being.  Home and relationship well-being.  Sexual activity.  Eating habits.  History of falls.  Memory and ability to understand (cognition).  Work and work Astronomer. Screening  You may have the following tests or measurements:  Height, weight, and BMI.  Blood pressure.  Lipid and cholesterol levels. These may be checked every 5 years, or more frequently if you are over 2 years old.  Skin check.  Lung cancer screening. You may have this screening every year starting at age 84 if you have a 30-pack-year history of smoking and currently smoke or have quit within the past 15 years.  Fecal occult blood test (FOBT) of the stool. You may have this test every year starting at age 40.  Flexible sigmoidoscopy or colonoscopy. You may have a sigmoidoscopy every 5 years or a colonoscopy every 10 years starting at age 50.  Prostate cancer screening. Recommendations will vary depending on your family history and other risks.  Hepatitis C blood test.  Hepatitis B blood test.  Sexually transmitted disease (STD) testing.  Diabetes screening. This is done by checking your blood sugar (glucose) after you have not eaten for a while (fasting). You may have this done every 1-3 years.  Abdominal aortic aneurysm (AAA) screening. You may need this if you are a current or former smoker.  Osteoporosis. You may be screened starting at age 57 if you are at high risk. Talk with your  health care provider about your test results, treatment options, and if necessary, the need for more tests. Vaccines  Your health care provider may recommend certain vaccines, such as:  Influenza vaccine. This is recommended every year.  Tetanus,  diphtheria, and acellular pertussis (Tdap, Td) vaccine. You may need a Td booster every 10 years.  Zoster vaccine. You may need this after age 58.  Pneumococcal 13-valent conjugate (PCV13) vaccine. One dose is recommended after age 55.  Pneumococcal polysaccharide (PPSV23) vaccine. One dose is recommended after age 17. Talk to your health care provider about which screenings and vaccines you need and how often you need them. This information is not intended to replace advice given to you by your health care provider. Make sure you discuss any questions you have with your health care provider. Document Released: 07/01/2015 Document Revised: 02/22/2016 Document Reviewed: 04/05/2015 Elsevier Interactive Patient Education  2017 [place] Prevention in the Home Falls can cause injuries. They can happen to people of all ages. There are many things you can do to make your home safe and to help prevent falls. What can I do on the outside of my home?  Regularly fix the edges of walkways and driveways and fix any cracks.  Remove anything that might make you trip as you walk through a door, such as a raised step or threshold.  Trim any bushes or trees on the path to your home.  Use bright outdoor lighting.  Clear any walking paths of anything that might make someone trip, such as rocks or tools.  Regularly check to see if handrails are loose or broken. Make sure that both sides of any steps have handrails.  Any raised decks and porches should have guardrails on the edges.  Have any leaves, snow, or ice cleared regularly.  Use sand or salt on walking paths during winter.  Clean up any spills in your garage right away. This includes oil or grease spills. What can I do in the bathroom?  Use night lights.  Install grab bars by the toilet and in the tub and shower. Do not use towel bars as grab bars.  Use non-skid mats or decals in the tub or shower.  If you need to sit down in  the shower, use a plastic, non-slip stool.  Keep the floor dry. Clean up any water that spills on the floor as soon as it happens.  Remove soap buildup in the tub or shower regularly.  Attach bath mats securely with double-sided non-slip rug tape.  Do not have throw rugs and other things on the floor that can make you trip. What can I do in the bedroom?  Use night lights.  Make sure that you have a light by your bed that is easy to reach.  Do not use any sheets or blankets that are too big for your bed. They should not hang down onto the floor.  Have a firm chair that has side arms. You can use this for support while you get dressed.  Do not have throw rugs and other things on the floor that can make you trip. What can I do in the kitchen?  Clean up any spills right away.  Avoid walking on wet floors.  Keep items that you use a lot in easy-to-reach places.  If you need to reach something above you, use a strong step stool that has a grab bar.  Keep electrical cords out of the way.  Do not use  floor polish or wax that makes floors slippery. If you must use wax, use non-skid floor wax.  Do not have throw rugs and other things on the floor that can make you trip. What can I do with my stairs?  Do not leave any items on the stairs.  Make sure that there are handrails on both sides of the stairs and use them. Fix handrails that are broken or loose. Make sure that handrails are as long as the stairways.  Check any carpeting to make sure that it is firmly attached to the stairs. Fix any carpet that is loose or worn.  Avoid having throw rugs at the top or bottom of the stairs. If you do have throw rugs, attach them to the floor with carpet tape.  Make sure that you have a light switch at the top of the stairs and the bottom of the stairs. If you do not have them, ask someone to add them for you. What else can I do to help prevent falls?  Wear shoes that:  Do not have high  heels.  Have rubber bottoms.  Are comfortable and fit you well.  Are closed at the toe. Do not wear sandals.  If you use a stepladder:  Make sure that it is fully opened. Do not climb a closed stepladder.  Make sure that both sides of the stepladder are locked into place.  Ask someone to hold it for you, if possible.  Clearly mark and make sure that you can see:  Any grab bars or handrails.  First and last steps.  Where the edge of each step is.  Use tools that help you move around (mobility aids) if they are needed. These include:  Canes.  Walkers.  Scooters.  Crutches.  Turn on the lights when you go into a dark area. Replace any light bulbs as soon as they burn out.  Set up your furniture so you have a clear path. Avoid moving your furniture around.  If any of your floors are uneven, fix them.  If there are any pets around you, be aware of where they are.  Review your medicines with your doctor. Some medicines can make you feel dizzy. This can increase your chance of falling. Ask your doctor what other things that you can do to help prevent falls. This information is not intended to replace advice given to you by your health care provider. Make sure you discuss any questions you have with your health care provider. Document Released: 03/31/2009 Document Revised: 11/10/2015 Document Reviewed: 07/09/2014 Elsevier Interactive Patient Education  2017 [employer].

## 2020-06-20 ENCOUNTER — Encounter: Payer: Medicare HMO | Admitting: Family Medicine

## 2020-06-22 ENCOUNTER — Encounter: Payer: Self-pay | Admitting: Emergency Medicine

## 2020-06-22 ENCOUNTER — Ambulatory Visit
Admission: EM | Admit: 2020-06-22 | Discharge: 2020-06-22 | Disposition: A | Discharge status: Home / Self Care | Payer: Medicare HMO | Attending: Internal Medicine | Admitting: Internal Medicine

## 2020-06-22 ENCOUNTER — Other Ambulatory Visit: Payer: Self-pay

## 2020-06-22 ENCOUNTER — Telehealth: Payer: Self-pay | Admitting: *Deleted

## 2020-06-22 DIAGNOSIS — J208 Acute bronchitis due to other specified organisms: Secondary | ICD-10-CM

## 2020-06-22 MED ORDER — BENZONATATE 100 MG PO CAPS: 100.0000 mg | ORAL_CAPSULE | Freq: Three times a day (TID) | ORAL | 0 refills | Status: DC

## 2020-06-22 MED ORDER — PREDNISONE 10 MG PO TABS: 20.0000 mg | ORAL_TABLET | Freq: Every day | ORAL | 0 refills | Status: DC

## 2020-06-22 MED ORDER — ALBUTEROL SULFATE HFA 108 (90 BASE) MCG/ACT IN AERS
1.0000 | INHALATION_SPRAY | Freq: Four times a day (QID) | RESPIRATORY_TRACT | 0 refills | Status: DC | PRN
Start: 2020-06-22 — End: 2020-09-27

## 2020-06-22 MED ORDER — FLUTICASONE PROPIONATE 50 MCG/ACT NA SUSP: 1.0000 | Freq: Every day | NASAL | 0 refills | Status: DC

## 2020-06-22 NOTE — ED Triage Notes (Signed)
Pt here with cough, body aches and congestion x 4 days

## 2020-06-22 NOTE — Telephone Encounter (Signed)
Patient called stating that he started with symptoms Saturday and tried to get a covid test done today and he is unable to get one anywhere.  Patient stated that he has a lot of chest congestion and a productive cough that is a mint green and his head is stopped up. Patient stated that he has SOB, tightness in his chest and a rattle in his chest. Patient was advised with his symptoms he should have a face to face evaluation so that someone can listen to his lungs. Patient was given information on [facility] at [facility]. Patient stated that he will plan on heading over to the UC shortly.

## 2020-06-22 NOTE — ED Provider Notes (Signed)
[facility]    CSN: [id number] Arrival date & time: 06/22/20  1641      History   Chief Complaint Chief Complaint  Patient presents with  . Cough  . Generalized Body Aches    HPI Mark Martinez is a 70 y.o. male comes to the urgent care with a 4-day history of nasal congestion, throat discomfort and a cough productive of yellowish sputum with a greenish tinge.  Patient denies any fever, chills or body aches.  Symptoms have been persistent.  He has tried over-the-counter medications with no improvement in his symptoms.  He admits to having some wheezing.  No chest discomfort or chest tightness.  Patient has been vaccinated against COVID-19 virus and is due for his booster vaccination.  No dizziness, near syncope or syncopal episode.  No nausea, vomiting or diarrhea.   HPI  Past Medical History:  Diagnosis Date  . Cataract   . COVID-19 virus infection 06/2019  . ED (erectile dysfunction)   . GERD (gastroesophageal reflux disease)    with hiatal hernia  . H/O hiatal hernia   . History of prostatitis   . HLD (hyperlipidemia)   . Prediabetes 07/28/2015  . Shortness of breath    exersion    Patient Active Problem List   Diagnosis Date Noted  . Acute cholecystitis 05/10/2020  . Encounter for orthopedic follow-up care 01/19/2020  . Impingement syndrome of right shoulder region 10/30/2019  . Subacromial bursitis of right shoulder joint 10/30/2019  . Pain in joint of right shoulder 09/22/2019  . Heme positive stool 06/03/2019  . Screening for viral disease 06/03/2019  . Finger laceration, subsequent encounter 06/03/2019  . Diarrhea 05/13/2019  . Advanced directives, counseling/discussion 04/15/2018  . Medicare annual wellness visit, initial 04/14/2018  . TMJ dysfunction 07/28/2015  . Primary osteoarthritis of elbow 07/28/2015  . Prediabetes 07/28/2015  . Persistent cough 08/11/2014  . Epidermal inclusion cyst 08/12/2013  . HLD (hyperlipidemia)   . Healthcare  maintenance 01/31/2011  . Overweight(278.02) 01/26/2010  . GERD (gastroesophageal reflux disease) 11/20/2007  . ERECTILE DYSFUNCTION 11/07/2007  . PROSTATITIS, HX OF 11/07/2007    Past Surgical History:  Procedure Laterality Date  . BACK SURGERY  1994   lumbar area  . COLONOSCOPY  2005   WNL, rpt 10 yrs  . COLONOSCOPY  07/2019   1 TA, hemorrhoids, rpt 7 yrs [name])  . CYST REMOVAL NECK Left 08/31/2013   Procedure: EXCISION EPIDERMAL INCLUSION CYST LEFT NECK;  Surgeon: Zenovia Jarred, MD  . ESOPHAGEAL DILATION     three last  . ESOPHAGOGASTRODUODENOSCOPY  12-27-1996   hiatal hernia (Dr. Sharlett Iles)  . SHOULDER ARTHROSCOPY WITH ROTATOR CUFF REPAIR AND SUBACROMIAL DECOMPRESSION Right 01/2020   distal clavicle resection, RTC repair (Supple)       Home Medications    Prior to Admission medications   Medication Sig Start Date End Date Taking? Authorizing Provider  albuterol (VENTOLIN HFA) 108 (90 Base) MCG/ACT inhaler Inhale 1-2 puffs into the lungs every 6 (six) hours as needed for wheezing or shortness of breath. 06/22/20  Yes Calin Ellery, Myrene Galas, MD  benzonatate (TESSALON) 100 MG capsule Take 1 capsule (100 mg total) by mouth every 8 (eight) hours. 06/22/20  Yes Amerika Nourse, Myrene Galas, MD  fluticasone (FLONASE) 50 MCG/ACT nasal spray Place 1 spray into both nostrils daily. 06/22/20  Yes Travontae Freiberger, Myrene Galas, MD  predniSONE (DELTASONE) 10 MG tablet Take 2 tablets (20 mg total) by mouth daily. 06/22/20  Yes Isiah Scheel, Myrene Galas, MD  acetaminophen (TYLENOL) 325 MG tablet Take 650 mg by mouth every 6 (six) hours as needed for moderate pain.     [provider]  esomeprazole ([place]) 40 MG capsule TAKE 1 CAPSULE BY MOUTH EVERY DAY 06/09/20   Ria Bush, MD  FLUAD QUADRIVALENT 0.5 ML injection  04/10/20   [provider]    Family History Family History  Problem Relation Age of Onset  . Valvular heart disease Mother   . Ulcers Father   . CAD Father 37       multiple stents   . CAD Paternal Uncle   . Cancer Neg Hx   . Diabetes Neg Hx   . Stroke Neg Hx     Social History Social History   Tobacco Use  . Smoking status: Never Smoker  . Smokeless tobacco: Former Network engineer Use Topics  . Alcohol use: No    Alcohol/week: 0.0 standard drinks  . Drug use: No     Allergies   Codeine   Review of Systems Review of Systems  Constitutional: Negative for activity change, fatigue and fever.  HENT: Positive for congestion, sinus pressure and sore throat.   Respiratory: Positive for cough and wheezing. Negative for shortness of breath.   Gastrointestinal: Negative for nausea and vomiting.  Neurological: Negative for dizziness, seizures, numbness and headaches.     Physical Exam Triage Vital Signs ED Triage Vitals  Enc Vitals Group     BP 06/22/20 1851 129/88     Pulse Rate 06/22/20 1851 99     Resp 06/22/20 1851 (!) 22     Temp 06/22/20 1851 97.9 F (36.6 C)     Temp Source 06/22/20 1851 Oral     SpO2 06/22/20 1851 94 %     Weight --      Height --      Head Circumference --      Peak Flow --      Pain Score 06/22/20 1856 3     Pain Loc --      Pain Edu? --      Excl. in [place]? --    No data found.  Updated Vital Signs BP 129/88   Pulse 99   Temp 97.9 F (36.6 C) (Oral)   Resp (!) 22   SpO2 94%   Visual Acuity Right Eye Distance:   Left Eye Distance:   Bilateral Distance:    Right Eye Near:   Left Eye Near:    Bilateral Near:     Physical Exam Vitals and nursing note reviewed.  Constitutional:      General: He is not in acute distress.    Appearance: He is not ill-appearing.  HENT:     Right Ear: Tympanic membrane normal.     Left Ear: Tympanic membrane normal.     Mouth/Throat:     Pharynx: Posterior oropharyngeal erythema present.  Cardiovascular:     Rate and Rhythm: Normal rate and regular rhythm.     Pulses: Normal pulses.     Heart sounds: Normal heart sounds.  Pulmonary:     Effort: Pulmonary effort is  normal.     Breath sounds: Wheezing present. No rhonchi.  Neurological:     Mental Status: He is alert.      UC Treatments / Results  Labs (all labs ordered are listed, but only abnormal results are displayed) Labs Reviewed  NOVEL CORONAVIRUS, NAA    EKG   Radiology No results found.  Procedures Procedures (  including critical care time)  Medications Ordered in UC Medications - No data to display  Initial Impression / Assessment and Plan / UC Course  I have reviewed the triage vital signs and the nursing notes.  Pertinent labs & imaging results that were available during my care of the patient were reviewed by me and considered in my medical decision making (see chart for details).     1.  Acute viral bronchitis: Albuterol inhaler as needed for shortness of breath/wheezing Tessalon Perles as needed for cough Prednisone 20 mg orally daily for 5 days Fluticasone nasal spray No indication for antibiotic use Lung exam sounds clear except for wheezing-no indication for imaging study. Final Clinical Impressions(s) / UC Diagnoses   Final diagnoses:  Acute viral bronchitis     Discharge Instructions     Please quarantine until COVID-19 test results are available Increase oral fluid intake We will call you with recommendations if your labs are abnormal Take medications as directed Return to urgent care if symptoms worsen.   ED Prescriptions    Medication Sig Dispense Auth. Provider   albuterol (VENTOLIN HFA) 108 (90 Base) MCG/ACT inhaler Inhale 1-2 puffs into the lungs every 6 (six) hours as needed for wheezing or shortness of breath. 18 g Liyah Higham, Britta Mccreedy, MD   benzonatate (TESSALON) 100 MG capsule Take 1 capsule (100 mg total) by mouth every 8 (eight) hours. 21 capsule Mekisha Bittel, Britta Mccreedy, MD   predniSONE (DELTASONE) 10 MG tablet Take 2 tablets (20 mg total) by mouth daily. 15 tablet Phila Shoaf, Britta Mccreedy, MD   fluticasone (FLONASE) 50 MCG/ACT nasal spray Place 1  spray into both nostrils daily. 16 g Markail Diekman, Britta Mccreedy, MD     PDMP not reviewed this encounter.   Merrilee Jansky, MD 06/22/20 [phone]

## 2020-06-22 NOTE — Telephone Encounter (Signed)
Noted. Thank you agree with in person eval.

## 2020-06-22 NOTE — Discharge Instructions (Addendum)
Please quarantine until COVID-19 test results are available Increase oral fluid intake We will call you with recommendations if your labs are abnormal Take medications as directed Return to urgent care if symptoms worsen.

## 2020-06-25 LAB — NOVEL CORONAVIRUS, NAA: SARS-CoV-2, NAA: NOT DETECTED

## 2020-06-27 NOTE — Telephone Encounter (Signed)
Seen at [facility], COVID test negative, treated for viral bronchitis.

## 2020-07-05 ENCOUNTER — Ambulatory Visit (INDEPENDENT_AMBULATORY_CARE_PROVIDER_SITE_OTHER): Payer: Medicare HMO | Admitting: Family Medicine

## 2020-07-05 ENCOUNTER — Other Ambulatory Visit: Payer: Self-pay

## 2020-07-05 ENCOUNTER — Encounter: Payer: Self-pay | Admitting: Family Medicine

## 2020-07-05 VITALS — BP 118/80 | HR 75 | Temp 97.5°F | Ht 72.0 in | Wt 208.2 lb

## 2020-07-05 DIAGNOSIS — J019 Acute sinusitis, unspecified: Secondary | ICD-10-CM

## 2020-07-05 DIAGNOSIS — Z9049 Acquired absence of other specified parts of digestive tract: Secondary | ICD-10-CM | POA: Diagnosis not present

## 2020-07-05 DIAGNOSIS — R7401 Elevation of levels of liver transaminase levels: Secondary | ICD-10-CM | POA: Diagnosis not present

## 2020-07-05 DIAGNOSIS — Z0000 Encounter for general adult medical examination without abnormal findings: Secondary | ICD-10-CM

## 2020-07-05 DIAGNOSIS — E785 Hyperlipidemia, unspecified: Secondary | ICD-10-CM | POA: Diagnosis not present

## 2020-07-05 DIAGNOSIS — K219 Gastro-esophageal reflux disease without esophagitis: Secondary | ICD-10-CM | POA: Diagnosis not present

## 2020-07-05 DIAGNOSIS — Z7189 Other specified counseling: Secondary | ICD-10-CM | POA: Diagnosis not present

## 2020-07-05 DIAGNOSIS — F528 Other sexual dysfunction not due to a substance or known physiological condition: Secondary | ICD-10-CM

## 2020-07-05 DIAGNOSIS — R7303 Prediabetes: Secondary | ICD-10-CM

## 2020-07-05 DIAGNOSIS — R197 Diarrhea, unspecified: Secondary | ICD-10-CM

## 2020-07-05 DIAGNOSIS — R748 Abnormal levels of other serum enzymes: Secondary | ICD-10-CM | POA: Insufficient documentation

## 2020-07-05 MED ORDER — SILDENAFIL CITRATE 20 MG PO TABS
60.0000 mg | ORAL_TABLET | Freq: Every day | ORAL | 6 refills | Status: DC | PRN
Start: 2020-07-05 — End: 2021-09-13

## 2020-07-05 MED ORDER — ESOMEPRAZOLE MAGNESIUM 40 MG PO CPDR
40.0000 mg | DELAYED_RELEASE_CAPSULE | Freq: Every day | ORAL | 3 refills | Status: DC
Start: 2020-07-05 — End: 2021-07-19

## 2020-07-05 MED ORDER — ESOMEPRAZOLE MAGNESIUM 40 MG PO CPDR
40.0000 mg | DELAYED_RELEASE_CAPSULE | Freq: Every day | ORAL | 3 refills | Status: DC
Start: 2020-07-05 — End: 2020-07-05

## 2020-07-05 MED ORDER — AMOXICILLIN 875 MG PO TABS: 875.0000 mg | ORAL_TABLET | Freq: Two times a day (BID) | ORAL | 0 refills | Status: DC

## 2020-07-05 NOTE — Assessment & Plan Note (Signed)
Preventative protocols reviewed and updated unless pt declined. Discussed healthy diet and lifestyle.  

## 2020-07-05 NOTE — Assessment & Plan Note (Signed)
Reviewed with patient. Encouraged limiting added sugar intake.

## 2020-07-05 NOTE — Assessment & Plan Note (Signed)
Chronic, stable period off medications.  The 10-year ASCVD risk score Mikey Bussing DC Brooke Bonito., et al., 2013) is: 12.4%   Values used to calculate the score:     Age: 70 years     Sex: Male     Is Non-Hispanic African American: No     Diabetic: No     Tobacco smoker: No     Systolic Blood Pressure: 176 mmHg     Is BP treated: No     HDL Cholesterol: 48.8 mg/dL     Total Cholesterol: 162 mg/dL

## 2020-07-05 NOTE — Assessment & Plan Note (Signed)
Persistent loose stools since cholecystectomy. Discussed consideration for bile salt binding agent, he will monitor for now, may continue PRN imodium.

## 2020-07-05 NOTE — Patient Instructions (Addendum)
For ongoing sinus pressure - treat with amoxicillin course. Let us know if ongoing symptom.  We discussed bile salt binding medicine to help diarrhea, let me know if interested in trial of this (colestipol).  If interested, check with pharmacy about new 2 shot shingles series (shingrix).  Bring [place] a copy of your living will to update your chart.  Liver function was elevated - return for lab visit in 2-3 weeks to recheck this.  Return as needed or in 1 year for next physical/wellness visit   Health Maintenance After Age 7 After age 52, you are at a higher risk for certain long-term diseases and infections as well as injuries from falls. Falls are a major cause of broken bones and head injuries in people who are older than age 19. Getting regular preventive care can help to keep you healthy and well. Preventive care includes getting regular testing and making lifestyle changes as recommended by your health care provider. Talk with your health care provider about:  Which screenings and tests you should have. A screening is a test that checks for a disease when you have no symptoms.  A diet and exercise plan that is right for you. What should I know about screenings and tests to prevent falls? Screening and testing are the best ways to find a health problem early. Early diagnosis and treatment give you the best chance of managing medical conditions that are common after age 36. Certain conditions and lifestyle choices may make you more likely to have a fall. Your health care provider may recommend:  Regular vision checks. Poor vision and conditions such as cataracts can make you more likely to have a fall. If you wear glasses, make sure to get your prescription updated if your vision changes.  Medicine review. Work with your health care provider to regularly review all of the medicines you are taking, including over-the-counter medicines. Ask your health care provider about any side effects that may  make you more likely to have a fall. Tell your health care provider if any medicines that you take make you feel dizzy or sleepy.  Osteoporosis screening. Osteoporosis is a condition that causes the bones to get weaker. This can make the bones weak and cause them to break more easily.  Blood pressure screening. Blood pressure changes and medicines to control blood pressure can make you feel dizzy.  Strength and balance checks. Your health care provider may recommend certain tests to check your strength and balance while standing, walking, or changing positions.  Foot health exam. Foot pain and numbness, as well as not wearing proper footwear, can make you more likely to have a fall.  Depression screening. You may be more likely to have a fall if you have a fear of falling, feel emotionally low, or feel unable to do activities that you used to do.  Alcohol use screening. Using too much alcohol can affect your balance and may make you more likely to have a fall. What actions can I take to lower my risk of falls? General instructions  Talk with your health care provider about your risks for falling. Tell your health care provider if: ? You fall. Be sure to tell your health care provider about all falls, even ones that seem minor. ? You feel dizzy, sleepy, or off-balance.  Take over-the-counter and prescription medicines only as told by your health care provider. These include any supplements.  Eat a healthy diet and maintain a healthy weight. A healthy diet  includes low-fat dairy products, low-fat (lean) meats, and fiber from whole grains, beans, and lots of fruits and vegetables. Home safety  Remove any tripping hazards, such as rugs, cords, and clutter.  Install safety equipment such as grab bars in bathrooms and safety rails on stairs.  Keep rooms and walkways well-lit. Activity  Follow a regular exercise program to stay fit. This will help you maintain your balance. Ask your health  care provider what types of exercise are appropriate for you.  If you need a cane or walker, use it as recommended by your health care provider.  Wear supportive shoes that have nonskid soles.   Lifestyle  Do not drink alcohol if your health care provider tells you not to drink.  If you drink alcohol, limit how much you have: ? 0-1 drink a day for women. ? 0-2 drinks a day for men.  Be aware of how much alcohol is in your drink. In the [place] one drink equals one typical bottle of beer (12 oz), one-half glass of wine (5 oz), or one shot of hard liquor (1 oz).  Do not use any products that contain nicotine or tobacco, such as cigarettes and e-cigarettes. If you need help quitting, ask your health care provider. Summary  Having a healthy lifestyle and getting preventive care can help to protect your health and wellness after age 56.  Screening and testing are the best way to find a health problem early and help you avoid having a fall. Early diagnosis and treatment give you the best chance for managing medical conditions that are more common for people who are older than age 46.  Falls are a major cause of broken bones and head injuries in people who are older than age 15. Take precautions to prevent a fall at home.  Work with your health care provider to learn what changes you can make to improve your health and wellness and to prevent falls. This information is not intended to replace advice given to you by your health care provider. Make sure you discuss any questions you have with your health care provider. Document Revised: 09/25/2018 Document Reviewed: 04/17/2017 Elsevier Patient Education  2021 [employer].

## 2020-07-05 NOTE — Assessment & Plan Note (Signed)
Chronic, stable on esomeprazole 40mg  daily - refilled.

## 2020-07-05 NOTE — Assessment & Plan Note (Signed)
Recent viral bronchitis s/p [facility] eval. Now with persistent sinus symptoms will cover for acute bacterial sinusitis with amoxicillin course. Update if ongoing or worsening symptoms. He fortunately did test negative for COVID.

## 2020-07-05 NOTE — Progress Notes (Signed)
Patient ID: Mark Martinez, male    DOB: 08/08/1950, 70 y.o.   MRN: [id number]  This visit was conducted in person.  BP 118/80 (BP Location: Left Arm, Patient Position: Sitting, Cuff Size: Large)   Pulse 75   Temp (!) 97.5 F (36.4 C) (Temporal)   Ht 6' (1.829 m)   Wt 208 lb 3 oz (94.4 kg)   SpO2 93%   BMI 28.24 kg/m    CC: CPE Subjective:   HPI: Mark Martinez is a 70 y.o. male presenting on 07/05/2020 for Annual Exam (Prt 2.  Requests new rx for sildenafil sent to [facility] and Headache (C/o sinus HA for past 2 wks.  Does not see where prednisone or benzonatate helped. )   Saw health advisor last month for medicare wellness visit. Note reviewed.   No exam data present  Flowsheet Row Clinical Support from 06/15/2020 in [facility] [place]  PHQ-2 Total Score 0      Fall Risk  06/15/2020 05/18/2020 04/22/2019 04/15/2018  Falls in the past year? 0 0 0 No  Number falls in past yr: 0 - - -  Injury with Fall? 0 - - -  Risk for fall due to : No Fall Risks - - -  Follow up Falls evaluation completed;Falls prevention discussed - - -    Seen earlier this month at [facility] for chest congestion with cough, tested negative for COVID, treated for viral bronchitis with tessalon, albuterol, fluticasone, prednisone course. Doesn't feel significant improvement after meds. Notes persistent bilateral frontal sinus pressure headache - better in the mornings, worse as day progresses. Some persistent productive cough and overall clear nasal discharge. Has coughing fits. Head > chest congestion.   S/p cholecystectomy late last year for acute cholecystitis - recovered well. Some residual post-cholecystectomy diarrhea. Persistent loose stools without abd pain/cramping. Released from surgical care - imodium didn't help.   Preventative: COLONOSCOPY 07/2019 - 1 TA, hemorrhoids, rpt 7 yrs [name]) Prostate -all normal checks in the past. Would like PSA but declines DRE. AAA screen - not  eligible  Lung cancer screen - not eligible  Flu shotyearly COVID vaccine Pfizer 09/2019, 10/2019  Tdap 2014, 05/2019 Pneumovax 04/2019 Shingrix - discussed  Advanced directive discussion- has this at home. Wife would be HCPOA. Asked to bring [place] a copy.  Seatbelt use discussed.  Sunscreen use discussed. No changing moles on skin. Saw derm - good report  Non smoker  Alcohol - none Dentistq6 mo Eye examyearly Bowel - no constipation - ongoing diarrhea Bladder - no incontinence   Caffeine: rare  Lives with wife, 1 dog.  Grown children  Occupation: Works in Biomedical scientist  Activity: stays active at work  Diet: daily fruits/vegetables, some water, decreased sweets     Relevant past medical, surgical, family and social history reviewed and updated as indicated. Interim medical history since our last visit reviewed. Allergies and medications reviewed and updated. Outpatient Medications Prior to Visit  Medication Sig Dispense Refill  . acetaminophen (TYLENOL) 325 MG tablet Take 650 mg by mouth every 6 (six) hours as needed for moderate pain.     Marland Kitchen albuterol (VENTOLIN HFA) 108 (90 Base) MCG/ACT inhaler Inhale 1-2 puffs into the lungs every 6 (six) hours as needed for wheezing or shortness of breath. 18 g 0  . esomeprazole (NEXIUM) 40 MG capsule TAKE 1 CAPSULE BY MOUTH EVERY DAY 90 capsule 0  . FLUAD QUADRIVALENT 0.5 ML injection     . fluticasone (FLONASE)  50 MCG/ACT nasal spray Place 1 spray into both nostrils daily. 16 g 0  . benzonatate (TESSALON) 100 MG capsule Take 1 capsule (100 mg total) by mouth every 8 (eight) hours. 21 capsule 0  . predniSONE (DELTASONE) 10 MG tablet Take 2 tablets (20 mg total) by mouth daily. 15 tablet 0   Facility-Administered Medications Prior to Visit  Medication Dose Route Frequency Provider Last Rate Last Admin  . 0.9 %  sodium chloride infusion  500 mL Intravenous Continuous Milus Banister, MD         Per HPI unless specifically indicated in  ROS section below Review of Systems  Constitutional: Negative for activity change, appetite change, chills, fatigue, fever and unexpected weight change.  HENT: Positive for congestion and sinus pressure. Negative for hearing loss.   Eyes: Negative for visual disturbance.  Respiratory: Positive for cough and wheezing. Negative for chest tightness and shortness of breath.   Cardiovascular: Negative for chest pain, palpitations and leg swelling.  Gastrointestinal: Positive for diarrhea. Negative for abdominal distention, abdominal pain, blood in stool, constipation, nausea and vomiting.  Genitourinary: Negative for difficulty urinating and hematuria.  Musculoskeletal: Negative for arthralgias, myalgias and neck pain.  Skin: Negative for rash.  Neurological: Negative for dizziness, seizures, syncope and headaches.  Hematological: Negative for adenopathy. Does not bruise/bleed easily.  Psychiatric/Behavioral: Negative for dysphoric mood. The patient is not nervous/anxious.    Objective:  BP 118/80 (BP Location: Left Arm, Patient Position: Sitting, Cuff Size: Large)   Pulse 75   Temp (!) 97.5 F (36.4 C) (Temporal)   Ht 6' (1.829 m)   Wt 208 lb 3 oz (94.4 kg)   SpO2 93%   BMI 28.24 kg/m   Wt Readings from Last 3 Encounters:  07/05/20 208 lb 3 oz (94.4 kg)  06/02/20 208 lb 6.4 oz (94.5 kg)  05/18/20 206 lb (93.4 kg)      Physical Exam Vitals and nursing note reviewed.  Constitutional:      General: He is not in acute distress.    Appearance: Normal appearance. He is well-developed and well-nourished.  HENT:     Head: Normocephalic and atraumatic.     Right Ear: Hearing, tympanic membrane, ear canal and external ear normal.     Left Ear: Hearing, tympanic membrane, ear canal and external ear normal.     Nose:     Right Sinus: No maxillary sinus tenderness or frontal sinus tenderness.     Left Sinus: No maxillary sinus tenderness or frontal sinus tenderness.     Mouth/Throat:      Mouth: Oropharynx is clear and moist and mucous membranes are normal.     Pharynx: No posterior oropharyngeal edema.  Eyes:     General: No scleral icterus.    Extraocular Movements: Extraocular movements intact and EOM normal.     Conjunctiva/sclera: Conjunctivae normal.     Pupils: Pupils are equal, round, and reactive to light.  Neck:     Thyroid: No thyroid mass or thyromegaly.     Vascular: No carotid bruit.  Cardiovascular:     Rate and Rhythm: Normal rate and regular rhythm.     Pulses: Normal pulses and intact distal pulses.          Radial pulses are 2+ on the right side and 2+ on the left side.     Heart sounds: Normal heart sounds. No murmur heard.   Pulmonary:     Effort: Pulmonary effort is normal. No respiratory distress.  Breath sounds: Normal breath sounds. No wheezing, rhonchi or rales.  Abdominal:     General: Abdomen is flat. Bowel sounds are normal. There is no distension.     Palpations: Abdomen is soft. There is no mass.     Tenderness: There is no abdominal tenderness. There is no guarding or rebound.     Hernia: No hernia is present.  Musculoskeletal:        General: No edema. Normal range of motion.     Cervical back: Normal range of motion and neck supple.     Right lower leg: No edema.     Left lower leg: No edema.  Lymphadenopathy:     Cervical: No cervical adenopathy.  Skin:    General: Skin is warm and dry.     Findings: No rash.  Neurological:     General: No focal deficit present.     Mental Status: He is alert and oriented to person, place, and time.     Comments: CN grossly intact, station and gait intact  Psychiatric:        Mood and Affect: Mood and affect and mood normal.        Behavior: Behavior normal.        Thought Content: Thought content normal.        Judgment: Judgment normal.       Results for orders placed or performed during the hospital encounter of 06/22/20  Novel Coronavirus, NAA ([facility])   Specimen:  Nasopharyngeal Swab; Nasopharyngeal(NP) swabs in vial transport medium   Nasopharynge  Patient  Result Value Ref Range   SARS-CoV-2, NAA Not Detected Not Detected   Assessment & Plan:  This visit occurred during the SARS-CoV-2 public health emergency.  Safety protocols were in place, including screening questions prior to the visit, additional usage of staff PPE, and extensive cleaning of exam room while observing appropriate contact time as indicated for disinfecting solutions.   Problem List Items Addressed This Visit    Transaminitis    Newly noted after cholecystectomy ?related to this. I have asked him to return in a few weeks to recheck, if persistently elevated consider further eval (ie viral hep panel, ferritin, etc).       Relevant Orders   Comprehensive metabolic panel   S/P laparoscopic cholecystectomy    Recovered well however now with transaminitis - see below.       Prediabetes    Reviewed with patient. Encouraged limiting added sugar intake.       HLD (hyperlipidemia)    Chronic, stable period off medications.  The 10-year ASCVD risk score Mikey Bussing DC Brooke Bonito., et al., 2013) is: 12.4%   Values used to calculate the score:     Age: 67 years     Sex: Male     Is Non-Hispanic African American: No     Diabetic: No     Tobacco smoker: No     Systolic Blood Pressure: 542 mmHg     Is BP treated: No     HDL Cholesterol: 48.8 mg/dL     Total Cholesterol: 162 mg/dL       Relevant Medications   sildenafil (REVATIO) 20 MG tablet   Healthcare maintenance - Primary    Preventative protocols reviewed and updated unless pt declined. Discussed healthy diet and lifestyle.       GERD (gastroesophageal reflux disease)    Chronic, stable on esomeprazole 40mg  daily - refilled.       Relevant Medications   esomeprazole (  NEXIUM) 40 MG capsule   ERECTILE DYSFUNCTION    Sildenafil 20mg  dose working well (3 tab at a time).  Refilled. Pt aware to avoid nitrates while using  medication.       Diarrhea    Persistent loose stools since cholecystectomy. Discussed consideration for bile salt binding agent, he will monitor for now, may continue PRN imodium.       Advanced directives, counseling/discussion    Advanced directive discussion- has this at home. Wife would be HCPOA. Asked to bring [place] a copy.       Acute sinusitis    Recent viral bronchitis s/p [facility] eval. Now with persistent sinus symptoms will cover for acute bacterial sinusitis with amoxicillin course. Update if ongoing or worsening symptoms. He fortunately did test negative for COVID.       Relevant Medications   amoxicillin (AMOXIL) 875 MG tablet       Meds ordered this encounter  Medications  . DISCONTD: esomeprazole (NEXIUM) 40 MG capsule    Sig: Take 1 capsule (40 mg total) by mouth daily at 12 noon.    Dispense:  90 capsule    Refill:  3  . amoxicillin (AMOXIL) 875 MG tablet    Sig: Take 1 tablet (875 mg total) by mouth 2 (two) times daily.    Dispense:  20 tablet    Refill:  0  . sildenafil (REVATIO) 20 MG tablet    Sig: Take 3-5 tablets (60-100 mg total) by mouth daily as needed (relations).    Dispense:  60 tablet    Refill:  6  . esomeprazole (NEXIUM) 40 MG capsule    Sig: Take 1 capsule (40 mg total) by mouth daily at 12 noon.    Dispense:  90 capsule    Refill:  3   Orders Placed This Encounter  Procedures  . Comprehensive metabolic panel    Standing Status:   Future    Standing Expiration Date:   07/05/2021    Patient instructions: For ongoing sinus pressure - treat with amoxicillin course. Let us know if ongoing symptom.  We discussed bile salt binding medicine to help diarrhea, let me know if interested in trial of this (colestipol).  If interested, check with pharmacy about new 2 shot shingles series (shingrix).  Bring [place] a copy of your living will to update your chart.  Liver function was elevated - return for lab visit in 2-3 weeks to recheck this.  Return as  needed or in 1 year for next physical/wellness visit   Follow up plan: Return in about 1 year (around 07/05/2021) for annual exam, prior fasting for blood work, medicare wellness visit.  Ria Bush, MD

## 2020-07-05 NOTE — Assessment & Plan Note (Signed)
Recovered well however now with transaminitis - see below.

## 2020-07-05 NOTE — Assessment & Plan Note (Signed)
Newly noted after cholecystectomy ?related to this. I have asked him to return in a few weeks to recheck, if persistently elevated consider further eval (ie viral hep panel, ferritin, etc).

## 2020-07-05 NOTE — Assessment & Plan Note (Signed)
Advanced directive discussion - has this at home. Wife would be HCPOA. Asked to bring us a copy.  °

## 2020-07-05 NOTE — Assessment & Plan Note (Signed)
Sildenafil 20mg  dose working well (3 tab at a time).  Refilled. Pt aware to avoid nitrates while using medication.

## 2020-07-18 ENCOUNTER — Other Ambulatory Visit: Payer: Self-pay | Admitting: Family Medicine

## 2020-07-19 NOTE — Telephone Encounter (Signed)
Pharmacy requests refill on: Sildenafil 20 mg   LAST REFILL: 07/05/2020 (Q-60,R-6) LAST OV: 07/05/2020 NEXT OV: Not Scheduled  PHARMACY: [facility] [place], [place]

## 2020-07-29 ENCOUNTER — Other Ambulatory Visit (INDEPENDENT_AMBULATORY_CARE_PROVIDER_SITE_OTHER): Payer: Medicare HMO

## 2020-07-29 ENCOUNTER — Other Ambulatory Visit: Payer: Self-pay

## 2020-07-29 DIAGNOSIS — R7401 Elevation of levels of liver transaminase levels: Secondary | ICD-10-CM | POA: Diagnosis not present

## 2020-07-29 LAB — COMPREHENSIVE METABOLIC PANEL
ALT: 80 U/L — ABNORMAL HIGH (ref 0–53)
AST: 84 U/L — ABNORMAL HIGH (ref 0–37)
Albumin: 3.9 g/dL (ref 3.5–5.2)
Alkaline Phosphatase: 122 U/L — ABNORMAL HIGH (ref 39–117)
BUN: 17 mg/dL (ref 6–23)
CO2: 31 mEq/L (ref 19–32)
Calcium: 9 mg/dL (ref 8.4–10.5)
Chloride: 102 mEq/L (ref 96–112)
Creatinine, Ser: 1.03 mg/dL (ref 0.40–1.50)
GFR: 74.76 mL/min (ref >60.00)
Glucose, Bld: 96 mg/dL (ref 70–99)
Potassium: 4.2 mEq/L (ref 3.5–5.1)
Sodium: 140 mEq/L (ref 135–145)
Total Bilirubin: 0.5 mg/dL (ref 0.2–1.2)
Total Protein: 7.1 g/dL (ref 6.0–8.3)

## 2020-07-30 ENCOUNTER — Other Ambulatory Visit: Payer: Self-pay | Admitting: Family Medicine

## 2020-07-30 DIAGNOSIS — R7401 Elevation of levels of liver transaminase levels: Secondary | ICD-10-CM

## 2020-07-30 DIAGNOSIS — Z9049 Acquired absence of other specified parts of digestive tract: Secondary | ICD-10-CM

## 2020-08-01 ENCOUNTER — Telehealth: Payer: Self-pay

## 2020-08-01 NOTE — Telephone Encounter (Signed)
Spoke with patient about his lab results. Patient is scheduled for his Ultrasound on 08/08/20. Patient states he had a couple episodes last week of hurting in his stomach, he thinks it was sometime after supper and he was in his recliner. It last for 3 to 4 seconds, it was sharp but not so severe that it took his breath away. No nausea, no vomiting. Patient states he is still having some trouble with diarrhea almost daily. Usually once a day but today was 3 times. Otherwise he is feeling fine.

## 2020-08-01 NOTE — Addendum Note (Signed)
Addended by: Kris Mouton on: 08/01/2020 02:41 PM   Modules accepted: Orders

## 2020-08-08 ENCOUNTER — Other Ambulatory Visit: Payer: Self-pay | Admitting: Family Medicine

## 2020-08-08 ENCOUNTER — Ambulatory Visit
Admission: RE | Admit: 2020-08-08 | Discharge: 2020-08-08 | Disposition: A | Discharge status: Home / Self Care | Payer: Medicare HMO | Source: Ambulatory Visit | Attending: Family Medicine | Admitting: Family Medicine

## 2020-08-08 ENCOUNTER — Other Ambulatory Visit: Payer: Self-pay

## 2020-08-08 DIAGNOSIS — R7401 Elevation of levels of liver transaminase levels: Secondary | ICD-10-CM

## 2020-08-08 DIAGNOSIS — Z9049 Acquired absence of other specified parts of digestive tract: Secondary | ICD-10-CM | POA: Diagnosis not present

## 2020-08-16 ENCOUNTER — Telehealth: Payer: Self-pay | Admitting: Family Medicine

## 2020-08-16 DIAGNOSIS — R7401 Elevation of levels of liver transaminase levels: Secondary | ICD-10-CM

## 2020-08-16 DIAGNOSIS — Z9049 Acquired absence of other specified parts of digestive tract: Secondary | ICD-10-CM

## 2020-08-16 DIAGNOSIS — R1011 Right upper quadrant pain: Secondary | ICD-10-CM

## 2020-08-16 NOTE — Telephone Encounter (Signed)
His liver numbers are high still and last night he had a low grade pain near his liver

## 2020-08-22 NOTE — Telephone Encounter (Addendum)
I don't think any dietary changes would really help unless he finds a specific food triggers discomfort - in that case avoid that food.  Next step given ongoing discomfort is rpt abd CT vs return to general surgeon for evaluation. Let me know which he'd like to do.

## 2020-08-23 NOTE — Addendum Note (Signed)
Addended by: Ria Bush on: 08/23/2020 05:38 PM   Modules accepted: Orders

## 2020-08-23 NOTE — Telephone Encounter (Addendum)
CT abd ordered.

## 2020-08-23 NOTE — Telephone Encounter (Signed)
Spoke with Mark Martinez relaying Dr. Synthia Innocent message.  Mark Martinez verbalizes understanding and agrees to rpt abd CT.

## 2020-09-07 ENCOUNTER — Ambulatory Visit
Admission: RE | Admit: 2020-09-07 | Discharge: 2020-09-07 | Disposition: A | Discharge status: Home / Self Care | Payer: Medicare HMO | Source: Ambulatory Visit | Attending: Family Medicine | Admitting: Family Medicine

## 2020-09-07 ENCOUNTER — Other Ambulatory Visit: Payer: Self-pay

## 2020-09-07 DIAGNOSIS — M47816 Spondylosis without myelopathy or radiculopathy, lumbar region: Secondary | ICD-10-CM | POA: Diagnosis not present

## 2020-09-07 DIAGNOSIS — Z9049 Acquired absence of other specified parts of digestive tract: Secondary | ICD-10-CM

## 2020-09-07 DIAGNOSIS — R1011 Right upper quadrant pain: Secondary | ICD-10-CM | POA: Diagnosis not present

## 2020-09-07 DIAGNOSIS — R7401 Elevation of levels of liver transaminase levels: Secondary | ICD-10-CM | POA: Diagnosis not present

## 2020-09-07 DIAGNOSIS — R748 Abnormal levels of other serum enzymes: Secondary | ICD-10-CM | POA: Diagnosis not present

## 2020-09-07 DIAGNOSIS — N200 Calculus of kidney: Secondary | ICD-10-CM | POA: Diagnosis not present

## 2020-09-07 DIAGNOSIS — M4186 Other forms of scoliosis, lumbar region: Secondary | ICD-10-CM | POA: Diagnosis not present

## 2020-09-07 MED ORDER — IOHEXOL 300 MG/ML  SOLN
100.0000 mL | Freq: Once | INTRAMUSCULAR | Status: AC | PRN
  Administered 2020-09-07: 100 mL via INTRAVENOUS

## 2020-09-09 ENCOUNTER — Other Ambulatory Visit: Payer: Self-pay

## 2020-09-09 ENCOUNTER — Encounter: Payer: Self-pay | Admitting: Family Medicine

## 2020-09-09 ENCOUNTER — Ambulatory Visit (INDEPENDENT_AMBULATORY_CARE_PROVIDER_SITE_OTHER): Payer: Medicare HMO | Admitting: Family Medicine

## 2020-09-09 VITALS — BP 118/78 | HR 64 | Temp 97.4°F | Ht 72.0 in | Wt 214.0 lb

## 2020-09-09 DIAGNOSIS — R7401 Elevation of levels of liver transaminase levels: Secondary | ICD-10-CM

## 2020-09-09 DIAGNOSIS — M47816 Spondylosis without myelopathy or radiculopathy, lumbar region: Secondary | ICD-10-CM | POA: Diagnosis not present

## 2020-09-09 DIAGNOSIS — K2289 Other specified disease of esophagus: Secondary | ICD-10-CM | POA: Diagnosis not present

## 2020-09-09 DIAGNOSIS — K2100 Gastro-esophageal reflux disease with esophagitis, without bleeding: Secondary | ICD-10-CM

## 2020-09-09 LAB — GAMMA GT: GGT: 70 U/L — ABNORMAL HIGH (ref 7–51)

## 2020-09-09 LAB — IBC PANEL
Iron: 86 ug/dL (ref 42–165)
Saturation Ratios: 16.8 % — ABNORMAL LOW (ref 20.0–50.0)
Transferrin: 365 mg/dL — ABNORMAL HIGH (ref 212.0–360.0)

## 2020-09-09 LAB — HEPATIC FUNCTION PANEL
ALT: 26 U/L (ref 0–53)
AST: 21 U/L (ref 0–37)
Albumin: 4.2 g/dL (ref 3.5–5.2)
Alkaline Phosphatase: 121 U/L — ABNORMAL HIGH (ref 39–117)
Bilirubin, Direct: 0.1 mg/dL (ref 0.0–0.3)
Total Bilirubin: 0.6 mg/dL (ref 0.2–1.2)
Total Protein: 6.9 g/dL (ref 6.0–8.3)

## 2020-09-09 LAB — TSH: TSH: 3.29 u[IU]/mL (ref 0.35–4.50)

## 2020-09-09 LAB — FERRITIN: Ferritin: 20 ng/mL — ABNORMAL LOW (ref 22.0–322.0)

## 2020-09-09 NOTE — Progress Notes (Signed)
Patient ID: Mark Martinez, male    DOB: 1951-06-13, 70 y.o.   MRN: [id number]  This visit was conducted in person.  BP 118/78   Pulse 64   Temp (!) 97.4 F (36.3 C) (Temporal)   Ht 6' (1.829 m)   Wt 214 lb (97.1 kg)   SpO2 94%   BMI 29.02 kg/m    CC: CT scan f/u  Subjective:   HPI: Mark Martinez is a 70 y.o. male presenting on 09/09/2020 for Diagnostic Study Follow Up (CT scan results)   See prior note for details.  S/p cholecystectomy late last year for acute cholecystitis. Residual post-cholecystectomy diarrhea managed with PRN immodium, also found persistent transaminitis after surgery associated with intermittent RUQ discomfort. Abd [place] returned normal, recent CT abdomen with contrast also unrevealing for RUQ pain. It did show small nonobstructive kidney stone, kidney cyst, small HH and esophageal wall thickening thought due to esophagitis.   Denies dysphagia, early satiety, unexpected weight loss.  Last EGD 1998, had esophageal dilation at that time. Long term on nexium since then.  No myalgias or muscle weakness.   Chronic GERD managed on esomeprazole 40mg  daily - overall well controlled, occasional GERD flare managed with cold water or rolaids, belching also helps.      Relevant past medical, surgical, family and social history reviewed and updated as indicated. Interim medical history since our last visit reviewed. Allergies and medications reviewed and updated. Outpatient Medications Prior to Visit  Medication Sig Dispense Refill  . acetaminophen (TYLENOL) 325 MG tablet Take 650 mg by mouth every 6 (six) hours as needed for moderate pain.     Marland Kitchen albuterol (VENTOLIN HFA) 108 (90 Base) MCG/ACT inhaler Inhale 1-2 puffs into the lungs every 6 (six) hours as needed for wheezing or shortness of breath. 18 g 0  . amoxicillin (AMOXIL) 875 MG tablet Take 1 tablet (875 mg total) by mouth 2 (two) times daily. 20 tablet 0  . esomeprazole (NEXIUM) 40 MG capsule Take 1 capsule  (40 mg total) by mouth daily at 12 noon. 90 capsule 3  . sildenafil (REVATIO) 20 MG tablet Take 3-5 tablets (60-100 mg total) by mouth daily as needed (relations). 60 tablet 6   Facility-Administered Medications Prior to Visit  Medication Dose Route Frequency Provider Last Rate Last Admin  . 0.9 %  sodium chloride infusion  500 mL Intravenous Continuous Milus Banister, MD         Per HPI unless specifically indicated in ROS section below Review of Systems Objective:  BP 118/78   Pulse 64   Temp (!) 97.4 F (36.3 C) (Temporal)   Ht 6' (1.829 m)   Wt 214 lb (97.1 kg)   SpO2 94%   BMI 29.02 kg/m   Wt Readings from Last 3 Encounters:  09/09/20 214 lb (97.1 kg)  07/05/20 208 lb 3 oz (94.4 kg)  06/02/20 208 lb 6.4 oz (94.5 kg)      Physical Exam Vitals and nursing note reviewed.  Constitutional:      Appearance: Normal appearance. He is not ill-appearing.  Cardiovascular:     Rate and Rhythm: Normal rate and regular rhythm.     Pulses: Normal pulses.     Heart sounds: Normal heart sounds. No murmur heard.   Pulmonary:     Effort: Pulmonary effort is normal. No respiratory distress.     Breath sounds: Normal breath sounds. No wheezing, rhonchi or rales.  Abdominal:     General:  Bowel sounds are normal. There is no distension.     Palpations: Abdomen is soft. There is no mass.     Tenderness: There is no abdominal tenderness. There is no right CVA tenderness, left CVA tenderness, guarding or rebound. Negative signs include Murphy's sign.     Hernia: No hernia is present.  Musculoskeletal:     Right lower leg: No edema.     Left lower leg: No edema.  Skin:    General: Skin is warm and dry.     Findings: No rash.  Neurological:     Mental Status: He is alert.  Psychiatric:        Mood and Affect: Mood normal.        Behavior: Behavior normal.       Results for orders placed or performed in visit on 07/29/20  Comprehensive metabolic panel  Result Value Ref Range    Sodium 140 135 - 145 mEq/L   Potassium 4.2 3.5 - 5.1 mEq/L   Chloride 102 96 - 112 mEq/L   CO2 31 19 - 32 mEq/L   Glucose, Bld 96 70 - 99 mg/dL   BUN 17 6 - 23 mg/dL   Creatinine, Ser 1.03 0.40 - 1.50 mg/dL   Total Bilirubin 0.5 0.2 - 1.2 mg/dL   Alkaline Phosphatase 122 (H) 39 - 117 U/L   AST 84 (H) 0 - 37 U/L   ALT 80 (H) 0 - 53 U/L   Total Protein 7.1 6.0 - 8.3 g/dL   Albumin 3.9 3.5 - 5.2 g/dL   GFR 74.76 >60.00 mL/min   Calcium 9.0 8.4 - 10.5 mg/dL   CT ABDOMEN W CONTRAST CLINICAL DATA:  Right upper quadrant abdominal pain, elevated liver enzymes. Cholecystectomy in November 2021.  EXAM: CT ABDOMEN WITH CONTRAST  TECHNIQUE: Multidetector CT imaging of the abdomen was performed using the standard protocol following bolus administration of intravenous contrast.  CONTRAST:  131mL OMNIPAQUE IOHEXOL 300 MG/ML  SOLN  COMPARISON:  05/10/2020 ultrasound of 08/08/2020  FINDINGS: Lower chest: Mild distal esophageal wall thickening and suspected small type 1 hiatal hernia. Esophagitis would be the most common cause for the mild wall thickening.  Hepatobiliary: Cholecystectomy. No biliary dilatation or perihepatic fluid collection. The liver parenchyma appears unremarkable.  Pancreas: Unremarkable  Spleen: Unremarkable  Adrenals/Urinary Tract: The adrenal glands appear normal.  2 mm right mid upper kidney nonobstructive renal calculus on image 102 series 4. No other calculi are observed. 0.5 cm left kidney lower pole hypodense lesion on image 22 series 9 is likely a small cyst but technically too small to characterize.  Stomach/Bowel: Unremarkable  Vascular/Lymphatic: Unremarkable  Other: No supplemental non-categorized findings.  Musculoskeletal: Levoconvex lumbar scoliosis with rotary component. Chronic bilateral pars defects with 1.2 cm of grade 2 anterolisthesis of L5 on S1. Grade 1 degenerative retrolisthesis at L2-3 and L3-4. Loss of disc height in the  lumbar spine especially at L2-3, L3-4, and L5-S1. There is resulting right foraminal impingement at L2-3, L3-4, L4-5, and L5-S1; and left foraminal impingement at L5-S1.  IMPRESSION: 1. A specific cause for the patient's right upper quadrant abdominal pain and elevated liver enzymes is not identified. 2. 2 mm nonobstructive right kidney upper pole calculus. 3. Lumbar scoliosis, spondylosis, and degenerative disc disease, resulting in multilevel impingement. 4. Chronic pars defects at L5 with grade 2 anterolisthesis of L5 on S1. 5. Mild distal esophageal wall thickening suggesting esophagitis. Suspected small type 1 hiatal hernia.  Electronically Signed   By: Thayer Jew  Janeece Fitting M.D.   On: 09/08/2020 12:16   Assessment & Plan:  This visit occurred during the SARS-CoV-2 public health emergency.  Safety protocols were in place, including screening questions prior to the visit, additional usage of staff PPE, and extensive cleaning of exam room while observing appropriate contact time as indicated for disinfecting solutions.   Problem List Items Addressed This Visit    GERD (gastroesophageal reflux disease)    Chronic, long term on esomeprazole 40mg  daily with good control. See above.       Transaminitis - Primary    Noted transaminitis since cholecystectomy, associated with intermittent mild RUQ discomfort. Workup to date unrevealing including abd [place] and contrasted CT abdomen. Will rpt labwork today. Discussed possible GI vs gen surg eval if persistent LFT elevation.       Esophageal thickening    Possible esophagitis per latest CT abdomen, no red flags. This is despite daily PPI. Consider GI eval pending labs from today.       Lumbar spondylosis    Discussed lumbar spine findings          No orders of the defined types were placed in this encounter.  No orders of the defined types were placed in this encounter.   Patient Instructions  For diarrhea - may continue as needed  immodium. Let me know if interested in trying bile acid binders (cholesterol medicine).  CT scan largely reassuring - no cause found for increased liver function or right upper abdominal discomfort.  labwork today for further evaluation. If normal, we may refer you to GI to check on esophagus again.   Follow up plan: Return if symptoms worsen or fail to improve.  Ria Bush, MD

## 2020-09-09 NOTE — Assessment & Plan Note (Signed)
Discussed lumbar spine findings

## 2020-09-09 NOTE — Assessment & Plan Note (Addendum)
Possible esophagitis per latest CT abdomen, no red flags. This is despite daily PPI. Consider GI eval pending labs from today.

## 2020-09-09 NOTE — Assessment & Plan Note (Signed)
Chronic, long term on esomeprazole 40mg  daily with good control. See above.

## 2020-09-09 NOTE — Patient Instructions (Addendum)
For diarrhea - may continue as needed immodium. Let me know if interested in trying bile acid binders (cholesterol medicine).  CT scan largely reassuring - no cause found for increased liver function or right upper abdominal discomfort.  labwork today for further evaluation. If normal, we may refer you to GI to check on esophagus again.

## 2020-09-09 NOTE — Assessment & Plan Note (Signed)
Noted transaminitis since cholecystectomy, associated with intermittent mild RUQ discomfort. Workup to date unrevealing including abd [place] and contrasted CT abdomen. Will rpt labwork today. Discussed possible GI vs gen surg eval if persistent LFT elevation.

## 2020-09-12 ENCOUNTER — Other Ambulatory Visit: Payer: Self-pay | Admitting: Family Medicine

## 2020-09-12 DIAGNOSIS — K2289 Other specified disease of esophagus: Secondary | ICD-10-CM

## 2020-09-12 DIAGNOSIS — E611 Iron deficiency: Secondary | ICD-10-CM | POA: Insufficient documentation

## 2020-09-12 DIAGNOSIS — R1011 Right upper quadrant pain: Secondary | ICD-10-CM | POA: Insufficient documentation

## 2020-09-12 DIAGNOSIS — K2100 Gastro-esophageal reflux disease with esophagitis, without bleeding: Secondary | ICD-10-CM

## 2020-09-12 LAB — HEPATITIS PANEL, ACUTE
Hep A IgM: NONREACTIVE
Hep B C IgM: NONREACTIVE
Hepatitis B Surface Ag: NONREACTIVE
Hepatitis C Ab: NONREACTIVE
SIGNAL TO CUT-OFF: 0.02 (ref <1.00)

## 2020-09-26 ENCOUNTER — Encounter: Payer: Self-pay | Admitting: *Deleted

## 2020-09-27 ENCOUNTER — Ambulatory Visit: Payer: Medicare HMO | Admitting: Gastroenterology

## 2020-09-27 ENCOUNTER — Encounter: Payer: Self-pay | Admitting: Gastroenterology

## 2020-09-27 VITALS — BP 128/80 | HR 92 | Ht 72.0 in | Wt 217.2 lb

## 2020-09-27 DIAGNOSIS — R933 Abnormal findings on diagnostic imaging of other parts of digestive tract: Secondary | ICD-10-CM

## 2020-09-27 DIAGNOSIS — K219 Gastro-esophageal reflux disease without esophagitis: Secondary | ICD-10-CM | POA: Diagnosis not present

## 2020-09-27 NOTE — Progress Notes (Signed)
09/27/2020 [place] [id number] 1951-02-03   HISTORY OF PRESENT ILLNESS: This is a pleasant 70 year old male who is a patient of Dr. [name].  He underwent a laparoscopic cholecystectomy by Dr. Dahlia Byes in November 2021.  He tells me that this was performed as an urgent surgery for what appears to be acute cholecystitis.  He says that he has persisted with some right upper quadrant abdominal pain ever since his surgery.  He also admits to intermittent diarrhea, usually after eating.  He says that it has decreased in frequency, but is still present.  He says at this point it is more of an inconvenience and he will see if it continues to improve with time.  Nonetheless, he also had some persistently elevated LFTs with alk phos of 126, AST 84, ALT 80, total bilirubin normal.  Most recently, just 2 weeks ago, all of LFTs were back down to normal with AST and ALT in the 20s and alk phos just slightly elevated at 121.  He has had both a repeat ultrasound and CT scan performed over the last couple of months and those have not shown any cause of his pain or his elevated liver enzymes.  CBD is normal.  Incidentally on the CT scan he was noted to have mild distal esophageal wall thickening suggesting esophagitis and the suspected small type I hiatal hernia.  He takes Nexium 40 mg daily and has been on that for a long time.  He tells me that as long as he takes his medication he typically does not have any symptoms, but if he forgets to take it then he definitely knows it by lunchtime.  He tells me that he sleeps sitting up in his chair because of back issues.   Past Medical History:  Diagnosis Date  . Cataract   . COVID-19 virus infection 06/2019  . ED (erectile dysfunction)   . Elevated LFTs   . Esophageal stricture   . GERD (gastroesophageal reflux disease)    with hiatal hernia  . H/O hiatal hernia   . History of prostatitis   . HLD (hyperlipidemia)   . Prediabetes 07/28/2015  . Shortness of  breath    exersion  . Tubular adenoma of colon    Past Surgical History:  Procedure Laterality Date  . BACK SURGERY  1994   lumbar area  . CHOLECYSTECTOMY    . COLONOSCOPY  2005   WNL, rpt 10 yrs  . COLONOSCOPY  07/2019   1 TA, hemorrhoids, rpt 7 yrs [name])  . CYST REMOVAL NECK Left 08/31/2013   Procedure: EXCISION EPIDERMAL INCLUSION CYST LEFT NECK;  Surgeon: Zenovia Jarred, MD  . ESOPHAGEAL DILATION     three last  . ESOPHAGOGASTRODUODENOSCOPY  12-27-1996   hiatal hernia (Dr. Sharlett Iles)  . SHOULDER ARTHROSCOPY WITH ROTATOR CUFF REPAIR AND SUBACROMIAL DECOMPRESSION Right 01/2020   distal clavicle resection, RTC repair ([facility])    reports that he has never smoked. He quit smokeless tobacco use about 11 years ago. He reports that he does not drink alcohol and does not use drugs. family history includes CAD in his paternal uncle; CAD (age of onset: 75) in his father; Ulcers in his father; Valvular heart disease in his mother. Allergies  Allergen Reactions  . Codeine Other (See Comments)    Keeps him awake      Outpatient Encounter Medications as of 09/27/2020  Medication Sig  . acetaminophen (TYLENOL) 325 MG tablet Take 650 mg by mouth every 6 (  six) hours as needed for moderate pain.   Marland Kitchen esomeprazole (NEXIUM) 40 MG capsule Take 1 capsule (40 mg total) by mouth daily at 12 noon.  . sildenafil (REVATIO) 20 MG tablet Take 3-5 tablets (60-100 mg total) by mouth daily as needed (relations).  . [DISCONTINUED] albuterol (VENTOLIN HFA) 108 (90 Base) MCG/ACT inhaler Inhale 1-2 puffs into the lungs every 6 (six) hours as needed for wheezing or shortness of breath.  . [DISCONTINUED] amoxicillin (AMOXIL) 875 MG tablet Take 1 tablet (875 mg total) by mouth 2 (two) times daily.   Facility-Administered Encounter Medications as of 09/27/2020  Medication  . 0.9 %  sodium chloride infusion    REVIEW OF SYSTEMS  : All other systems reviewed and negative except where noted in the History of  Present Illness.   PHYSICAL EXAM: BP 128/80   Pulse 92   Ht 6' (1.829 m)   Wt 217 lb 3.2 oz (98.5 kg)   SpO2 97%   BMI 29.46 kg/m  General: Well developed white male in no acute distress Head: Normocephalic and atraumatic Eyes:  Sclerae anicteric, conjunctiva pink. Ears: Normal auditory acuity Lungs: Clear throughout to auscultation; no W/R/R. Heart: Regular rate and rhythm; no M/R/G. Abdomen: Soft, non-distended.  BS present.  Non-tender. Musculoskeletal: Symmetrical with no gross deformities  Skin: No lesions on visible extremities Extremities: No edema  Neurological: Alert oriented x 4, grossly non-focal Psychological:  Alert and cooperative. Normal mood and affect  ASSESSMENT AND PLAN: *Abnormal CT scan showing mild distal esophageal wall thickening suggestive of esophagitis and a suspected small type I hiatal hernia.  These findings were incidental and were seen despite taking Nexium 40 mg daily.  Last EGD was in 2005.  We will plan for EGD with Dr. [name].  Pending findings he may need to increase PPI to twice daily, but I will hold off for now since he is relatively asymptomatic  The risks, benefits, and alternatives to EGD were discussed with the patient and he consents to proceed.    CC:  Ria Bush, MD

## 2020-09-27 NOTE — Patient Instructions (Signed)
You have been scheduled for an endoscopy. Please follow written instructions given to you at your visit today. If you use inhalers (even only as needed), please bring them with you on the day of your procedure.  If you are age 69 or older, your body mass index should be between 23-30. Your Body mass index is 29.46 kg/m. If this is out of the aforementioned range listed, please consider follow up with your Primary Care Provider.  Due to recent changes in healthcare laws, you may see the results of your imaging and laboratory studies on MyChart before your provider has had a chance to review them.  We understand that in some cases there may be results that are confusing or concerning to you. Not all laboratory results come back in the same time frame and the provider may be waiting for multiple results in order to interpret others.  Please give [place] 48 hours in order for your provider to thoroughly review all the results before contacting the office for clarification of your results.

## 2020-09-28 NOTE — Progress Notes (Signed)
I agree with the above note, plan 

## 2020-10-18 ENCOUNTER — Ambulatory Visit: Payer: Medicare HMO | Admitting: Gastroenterology

## 2020-10-18 ENCOUNTER — Encounter: Payer: Self-pay | Admitting: Gastroenterology

## 2020-10-18 ENCOUNTER — Other Ambulatory Visit: Payer: Self-pay

## 2020-10-18 VITALS — BP 123/86 | HR 67 | Resp 18

## 2020-10-18 DIAGNOSIS — K2970 Gastritis, unspecified, without bleeding: Secondary | ICD-10-CM

## 2020-10-18 DIAGNOSIS — K2990 Gastroduodenitis, unspecified, without bleeding: Secondary | ICD-10-CM

## 2020-10-18 DIAGNOSIS — K319 Disease of stomach and duodenum, unspecified: Secondary | ICD-10-CM | POA: Diagnosis not present

## 2020-10-18 DIAGNOSIS — K219 Gastro-esophageal reflux disease without esophagitis: Secondary | ICD-10-CM | POA: Diagnosis not present

## 2020-10-18 NOTE — Patient Instructions (Signed)
Thank you for letting us take care of your healthcare needs today. Please see handouts on Gastritis and Hiatal Hernia.   YOU HAD AN ENDOSCOPIC PROCEDURE TODAY AT [facility]:   Refer to the procedure report that was given to you for any specific questions about what was found during the examination.  If the procedure report does not answer your questions, please call your gastroenterologist to clarify.  If you requested that your care partner not be given the details of your procedure findings, then the procedure report has been included in a sealed envelope for you to review at your convenience later.  YOU SHOULD EXPECT: Some feelings of bloating in the abdomen. Passage of more gas than usual.  Walking can help get rid of the air that was put into your GI tract during the procedure and reduce the bloating. If you had a lower endoscopy (such as a colonoscopy or flexible sigmoidoscopy) you may notice spotting of blood in your stool or on the toilet paper. If you underwent a bowel prep for your procedure, you may not have a normal bowel movement for a few days.  Please Note:  You might notice some irritation and congestion in your nose or some drainage.  This is from the oxygen used during your procedure.  There is no need for concern and it should clear up in a day or so.  SYMPTOMS TO REPORT IMMEDIATELY:   Following upper endoscopy (EGD)  Vomiting of blood or coffee ground material  New chest pain or pain under the shoulder blades  Painful or persistently difficult swallowing  New shortness of breath  Fever of 100F or higher  Black, tarry-looking stools  For urgent or emergent issues, a gastroenterologist can be reached at any hour by calling [phone]. Do not use MyChart messaging for urgent concerns.    DIET:  We do recommend a small meal at first, but then you may proceed to your regular diet.  Drink plenty of fluids but you should avoid alcoholic beverages for 24  hours.  ACTIVITY:  You should plan to take it easy for the rest of today and you should NOT DRIVE or use heavy machinery until tomorrow (because of the sedation medicines used during the test).    FOLLOW UP: Our staff will call the number listed on your records 48-72 hours following your procedure to check on you and address any questions or concerns that you may have regarding the information given to you following your procedure. If we do not reach you, we will leave a message.  We will attempt to reach you two times.  During this call, we will ask if you have developed any symptoms of COVID 19. If you develop any symptoms (ie: fever, flu-like symptoms, shortness of breath, cough etc.) before then, please call [phone].  If you test positive for Covid 19 in the 2 weeks post procedure, please call and report this information to [place].    If any biopsies were taken you will be contacted by phone or by letter within the next 1-3 weeks.  Please call us at [phone] if you have not heard about the biopsies in 3 weeks.    SIGNATURES/CONFIDENTIALITY: You and/or your care partner have signed paperwork which will be entered into your electronic medical record.  These signatures attest to the fact that that the information above on your After Visit Summary has been reviewed and is understood.  Full responsibility of the confidentiality of this  discharge information lies with you and/or your care-partner. 

## 2020-10-18 NOTE — Progress Notes (Signed)
A and O x3. Report to RN. Tolerated MAC anesthesia well.Teeth unchanged after procedure.

## 2020-10-18 NOTE — Progress Notes (Signed)
Pt's states no medical or surgical changes since previsit or office visit. 

## 2020-10-18 NOTE — Op Note (Signed)
Mark Martinez: Mark Martinez Procedure Date: 10/18/2020 10:02 AM MRN: [id number] Endoscopist: Milus Banister , MD Age: 70 Referring MD:  Date of Birth: 1950/09/02 Gender: Male Account #: [id number] Procedure:                Upper GI endoscopy Indications:              Abnormal CT of the GI tract; suggested a thick                            distal esohagus; no dysphagia, no weight loss, GERD                            symptoms under good control on QD PPI Medicines:                Monitored Anesthesia Care Procedure:                Pre-Anesthesia Assessment:                           - Prior to the procedure, a History and Physical                            was performed, and patient medications and                            allergies were reviewed. The patient's tolerance of                            previous anesthesia was also reviewed. The risks                            and benefits of the procedure and the sedation                            options and risks were discussed with the patient.                            All questions were answered, and informed consent                            was obtained. Prior Anticoagulants: The patient has                            taken no previous anticoagulant or antiplatelet                            agents. ASA Grade Assessment: II - A patient with                            mild systemic disease. After reviewing the risks                            and benefits, the patient was deemed in  satisfactory condition to undergo the procedure.                           After obtaining informed consent, the endoscope was                            passed under direct vision. Throughout the                            procedure, the patient's blood pressure, pulse, and                            oxygen saturations were monitored continuously. The                            Endoscope was  introduced through the mouth, and                            advanced to the second part of duodenum. The upper                            GI endoscopy was accomplished without difficulty.                            The patient tolerated the procedure well. Scope In: Scope Out: Findings:                 Mild inflammation characterized by erythema,                            friability and granularity was found in the gastric                            antrum. Biopsies were taken with a cold forceps for                            histology.                           A small hiatal hernia was present.                           The exam was otherwise without abnormality. Complications:            No immediate complications. Estimated blood loss:                            None. Estimated Blood Loss:     Estimated blood loss: none. Impression:               - Mild non-specific gastritis.                           - Small hiatal hernia.                           - The examination  was otherwise normal. The CT scan                            suggesting a 'thick esophagus' was a false positive                            result. Recommendation:           - Patient has a contact number available for                            emergencies. The signs and symptoms of potential                            delayed complications were discussed with the                            patient. Return to normal activities tomorrow.                            Written discharge instructions were provided to the                            patient.                           - Resume previous diet.                           - Continue present medications.                           - Await pathology results. Milus Banister, MD 10/18/2020 10:13:31 AM This report has been signed electronically.

## 2020-10-18 NOTE — Progress Notes (Signed)
Called to room to assist during endoscopic procedure.  Patient ID and intended procedure confirmed with present staff. Received instructions for my participation in the procedure from the performing physician.  

## 2020-10-20 ENCOUNTER — Telehealth: Payer: Self-pay | Admitting: *Deleted

## 2020-10-20 NOTE — Telephone Encounter (Signed)
  Follow up Call-  Call back number 10/18/2020 07/29/2019  Post procedure Call Back phone  # [phone]  Permission to leave phone message Yes Yes  Some recent data might be hidden     Patient questions:  Do you have a fever, pain , or abdominal swelling? No. Pain Score  0 *  Have you tolerated food without any problems? Yes.    Have you been able to return to your normal activities? Yes.    Do you have any questions about your discharge instructions: Diet   No. Medications  No. Follow up visit  No.  Do you have questions or concerns about your Care? No.  Actions: * If pain score is 4 or above: No action needed, pain <4.  1. Have you developed a fever since your procedure? no  2.   Have you had an respiratory symptoms (SOB or cough) since your procedure? no  3.   Have you tested positive for COVID 19 since your procedure no  4.   Have you had any family members/close contacts diagnosed with the COVID 19 since your procedure?  no   If yes to any of these questions please route to Joylene John, RN and Joella Prince, RN

## 2020-10-28 ENCOUNTER — Encounter: Payer: Self-pay | Admitting: Gastroenterology

## 2020-11-01 ENCOUNTER — Telehealth: Payer: Self-pay | Admitting: Gastroenterology

## 2020-11-01 NOTE — Telephone Encounter (Signed)
Patient calling for path results 

## 2020-11-01 NOTE — Telephone Encounter (Signed)
The pt has been advised of the information in the letter. He is also aware a copy is in My Chart.  He had no questions.      Mark Martinez [place] [id number]   Dear [name],  The biopsies taken during your recent upper endoscopy showed no sign of infection, serious inflammation or cancer.  You should continue to follow the recommendations that we discussed at the time of your procedure.  If you have any questions or concerns, please don't hesitate to call.  Sincerely,    Milus Banister, MD  [facility]                       [name], [phone]                      1      Print Letter   Notes  None

## 2021-01-02 ENCOUNTER — Telehealth: Payer: Self-pay | Admitting: *Deleted

## 2021-01-02 MED ORDER — CHOLESTYRAMINE 4 G PO PACK: 4.0000 g | PACK | Freq: Two times a day (BID) | ORAL | 6 refills | Status: DC

## 2021-01-02 NOTE — Telephone Encounter (Signed)
May try cholestyramine 4g once daily for a few days then bump up to twice daily. Take with meals. Update [place] with effect.

## 2021-01-02 NOTE — Telephone Encounter (Signed)
Patient called back stating that he has tried OTC anti-diarrhea medication and it is not working well for him. Patient stated that he either has diarrhea or is constipated. Patient stated that Dr.Gutierrez told him if OTC anti-diarrhea medication did not work for him he could order a medication that would help with bile absorption. Patient is requesting that this be sent in for him. Pharmacy [facility]

## 2021-01-02 NOTE — Telephone Encounter (Signed)
Patient left a voicemail stating that he is calling about a bowel absorbant medication that he was advised that he may be able to get.  Called patient back to get more information. Left a message on voicemail for patient to call the office back.

## 2021-01-04 NOTE — Telephone Encounter (Signed)
Spoke with pt relaying Dr. Synthia Innocent message.  Pt verbalizes understanding.  States he picked up med last night.

## 2021-01-04 NOTE — Telephone Encounter (Signed)
Pt called returning your call 

## 2021-01-04 NOTE — Telephone Encounter (Signed)
Lvm asking pt to call back.  Need to relay Dr. G's message.  

## 2021-04-10 ENCOUNTER — Ambulatory Visit: Payer: Medicare HMO | Admitting: Dermatology

## 2021-04-10 DIAGNOSIS — J0110 Acute frontal sinusitis, unspecified: Secondary | ICD-10-CM | POA: Diagnosis not present

## 2021-04-10 DIAGNOSIS — J40 Bronchitis, not specified as acute or chronic: Secondary | ICD-10-CM | POA: Diagnosis not present

## 2021-04-10 DIAGNOSIS — R059 Cough, unspecified: Secondary | ICD-10-CM | POA: Diagnosis not present

## 2021-04-11 ENCOUNTER — Ambulatory Visit: Payer: Medicare HMO | Admitting: Dermatology

## 2021-04-17 ENCOUNTER — Telehealth: Payer: Self-pay | Admitting: Family Medicine

## 2021-04-17 NOTE — Telephone Encounter (Signed)
Ongoing symptoms for 2 wks with negative covid test - please schedule in-person office visit for tomorrow.   What symptoms is he currently having? Would offer codeine cough syrup.

## 2021-04-17 NOTE — Telephone Encounter (Signed)
[facility] and they said he had a sinus infection and bronchitis. Pt was given anabiotics, an inhaler, cough syrup and pills. Pt wants to know if the provider can prescribe some medication for him because the medication given did not work. Pt does have a virtual visit 11/1.

## 2021-04-17 NOTE — Telephone Encounter (Addendum)
Sinus headache, coughing, coughing up phlegm, patient stated that he had been given, a cough medicine,doxycycline, inhaler and a coughing pill. He stated that none of this has helped him. Patient is scheduled for tomorrow morning at 8:30 am.

## 2021-04-18 ENCOUNTER — Ambulatory Visit (INDEPENDENT_AMBULATORY_CARE_PROVIDER_SITE_OTHER): Payer: Medicare HMO | Admitting: Family Medicine

## 2021-04-18 ENCOUNTER — Encounter: Payer: Self-pay | Admitting: Family Medicine

## 2021-04-18 ENCOUNTER — Ambulatory Visit: Payer: Medicare HMO | Admitting: Family Medicine

## 2021-04-18 ENCOUNTER — Other Ambulatory Visit: Payer: Self-pay

## 2021-04-18 DIAGNOSIS — J22 Unspecified acute lower respiratory infection: Secondary | ICD-10-CM | POA: Insufficient documentation

## 2021-04-18 MED ORDER — AZITHROMYCIN 250 MG PO TABS: ORAL_TABLET | ORAL | 0 refills | Status: DC

## 2021-04-18 MED ORDER — PROMETHAZINE-DM 6.25-15 MG/5ML PO SYRP: 5.0000 mL | ORAL_SOLUTION | Freq: Three times a day (TID) | ORAL | 0 refills | Status: DC | PRN

## 2021-04-18 MED ORDER — PREDNISONE 20 MG PO TABS
ORAL_TABLET | ORAL | 0 refills | Status: DC
Start: 2021-04-18 — End: 2021-05-09

## 2021-04-18 NOTE — Patient Instructions (Addendum)
Stop doxycycline. Start azithromycin (zpack).  Take prednisone taper.  Phenergan cough syrup refilled.  Let us know if not improving with this.

## 2021-04-18 NOTE — Assessment & Plan Note (Addendum)
Tested negative for COVID x2. Acute worsening after initial improvement of upper respiratory infection consistent with bacterial superinfection. However lungs largely clear, appears well but with ongoing coughing fits. He has not responded to doxycycline course. Will treat with azithromycin to cover atypical bronchitis including pertussis. Will Rx prednisone taper as well as refill phenergan cough syrup. Update if not improving with this. Pt agrees with plan.

## 2021-04-18 NOTE — Progress Notes (Signed)
Patient ID: Mark Martinez, male    DOB: 1951/05/03, 70 y.o.   MRN: [id number]  This visit was conducted in person.  BP 118/82   Pulse 99   Temp 98 F (36.7 C) (Temporal)   Ht 6' (1.829 m)   Wt 211 lb 2 oz (95.8 kg)   SpO2 94%   BMI 28.63 kg/m    CC: cough Subjective:   HPI: [name] is a 70 y.o. male presenting on 04/18/2021 for Cough (C/o cough and sinus.  Sxs started 04/07/21.  Seen at [facility], tx abx.  Sxs no better. )   3 wks ago developed cold symptoms with expected improvement, then acutely worse 10 days ago.   Seen at [facility] 04/10/2021 diagnosed with acute sinusitis as well as bronchitis, treated with doxycycline 10d course, tessalon 200mg  perls, phenergan cough syrup and albuterol inhaler. No significant improvement with any of this. He has taken 9 days of abx without improvement.  Ongoing symptoms of sinus headache, productive cough, head and chest congestion. Initially feverish Tmax 100. Coughing fits, feels sweaty after cough, coughing to the point of gagging.   No fevers/chills, ear or tooth pain, ST.  No h/o asthma, no smoking history.   No sick contacts at home.  Negative COVID test x2.   GERD on nexium 40mg  daily. Recent EGD 10/2020. Also on questran with benefit.      Relevant past medical, surgical, family and social history reviewed and updated as indicated. Interim medical history since our last visit reviewed. Allergies and medications reviewed and updated. Outpatient Medications Prior to Visit  Medication Sig Dispense Refill   acetaminophen (TYLENOL) 325 MG tablet Take 650 mg by mouth every 6 (six) hours as needed for moderate pain.      albuterol (VENTOLIN HFA) 108 (90 Base) MCG/ACT inhaler Inhale into the lungs.     cholestyramine (QUESTRAN) 4 g packet Take 1 packet (4 g total) by mouth 2 (two) times daily before a meal. 60 each 6   esomeprazole (NEXIUM) 40 MG capsule Take 1 capsule (40 mg total) by mouth daily at 12 noon. 90  capsule 3   sildenafil (REVATIO) 20 MG tablet Take 3-5 tablets (60-100 mg total) by mouth daily as needed (relations). 60 tablet 6   doxycycline (VIBRAMYCIN) 100 MG capsule Take by mouth.     promethazine-dextromethorphan (PROMETHAZINE-DM) 6.25-15 MG/5ML syrup Take by mouth.     No facility-administered medications prior to visit.     Per HPI unless specifically indicated in ROS section below Review of Systems  Objective:  BP 118/82   Pulse 99   Temp 98 F (36.7 C) (Temporal)   Ht 6' (1.829 m)   Wt 211 lb 2 oz (95.8 kg)   SpO2 94%   BMI 28.63 kg/m   Wt Readings from Last 3 Encounters:  04/18/21 211 lb 2 oz (95.8 kg)  09/27/20 217 lb 3.2 oz (98.5 kg)  09/09/20 214 lb (97.1 kg)      Physical Exam Vitals and nursing note reviewed.  Constitutional:      Appearance: Normal appearance. He is not ill-appearing.  HENT:     Head: Normocephalic and atraumatic.     Right Ear: Hearing, tympanic membrane, ear canal and external ear normal. There is no impacted cerumen.     Left Ear: Hearing, tympanic membrane, ear canal and external ear normal. There is no impacted cerumen.     Nose: Nose normal. No mucosal edema, congestion or rhinorrhea.  Right Turbinates: Not enlarged or swollen.     Left Turbinates: Not enlarged or swollen.     Right Sinus: No maxillary sinus tenderness or frontal sinus tenderness.     Left Sinus: No maxillary sinus tenderness or frontal sinus tenderness.     Mouth/Throat:     Mouth: Mucous membranes are moist.     Pharynx: Oropharynx is clear. No oropharyngeal exudate or posterior oropharyngeal erythema.  Eyes:     Extraocular Movements: Extraocular movements intact.     Conjunctiva/sclera: Conjunctivae normal.     Pupils: Pupils are equal, round, and reactive to light.  Cardiovascular:     Rate and Rhythm: Normal rate and regular rhythm.     Pulses: Normal pulses.     Heart sounds: Normal heart sounds. No murmur heard. Pulmonary:     Effort: Pulmonary  effort is normal. No respiratory distress.     Breath sounds: Normal breath sounds. No wheezing, rhonchi or rales.     Comments: Lungs largely clear. Coughing fits of dry cough present  Musculoskeletal:     Cervical back: Normal range of motion and neck supple. No rigidity.     Right lower leg: No edema.     Left lower leg: No edema.  Lymphadenopathy:     Cervical: No cervical adenopathy.  Skin:    General: Skin is warm and dry.     Findings: No rash.  Neurological:     Mental Status: He is alert.  Psychiatric:        Mood and Affect: Mood normal.        Behavior: Behavior normal.       Assessment & Plan:  This visit occurred during the SARS-CoV-2 public health emergency.  Safety protocols were in place, including screening questions prior to the visit, additional usage of staff PPE, and extensive cleaning of exam room while observing appropriate contact time as indicated for disinfecting solutions.   Problem List Items Addressed This Visit     Acute respiratory infection    Tested negative for COVID x2. Acute worsening after initial improvement of upper respiratory infection consistent with bacterial superinfection. However lungs largely clear, appears well but with ongoing coughing fits. He has not responded to doxycycline course. Will treat with azithromycin to cover atypical bronchitis including pertussis. Will Rx prednisone taper as well as refill phenergan cough syrup. Update if not improving with this. Pt agrees with plan.       Relevant Medications   azithromycin (ZITHROMAX) 250 MG tablet     Meds ordered this encounter  Medications   promethazine-dextromethorphan (PROMETHAZINE-DM) 6.25-15 MG/5ML syrup    Sig: Take 5 mLs by mouth 3 (three) times daily as needed for cough (sedation precautions).    Dispense:  120 mL    Refill:  0   azithromycin (ZITHROMAX) 250 MG tablet    Sig: Take two tablets on day one followed by one tablet on days 2-5    Dispense:  6 each     Refill:  0   predniSONE (DELTASONE) 20 MG tablet    Sig: Take two tablets daily for 3 days followed by one tablet daily for 4 days    Dispense:  10 tablet    Refill:  0    No orders of the defined types were placed in this encounter.   Patient Instructions  Stop doxycycline. Start azithromycin (zpack).  Take prednisone taper.  Phenergan cough syrup refilled.  Let us know if not improving with this.  Follow up plan: Return if symptoms worsen or fail to improve.  Ria Bush, MD

## 2021-04-19 ENCOUNTER — Ambulatory Visit: Payer: Medicare HMO | Admitting: Dermatology

## 2021-04-19 DIAGNOSIS — Z1283 Encounter for screening for malignant neoplasm of skin: Secondary | ICD-10-CM

## 2021-04-19 DIAGNOSIS — L720 Epidermal cyst: Secondary | ICD-10-CM | POA: Diagnosis not present

## 2021-04-19 DIAGNOSIS — L719 Rosacea, unspecified: Secondary | ICD-10-CM

## 2021-04-19 DIAGNOSIS — L02212 Cutaneous abscess of back [any part, except buttock]: Secondary | ICD-10-CM

## 2021-04-19 DIAGNOSIS — L03312 Cellulitis of back [any part except buttock]: Secondary | ICD-10-CM

## 2021-04-19 DIAGNOSIS — L820 Inflamed seborrheic keratosis: Secondary | ICD-10-CM | POA: Diagnosis not present

## 2021-04-19 DIAGNOSIS — L0291 Cutaneous abscess, unspecified: Secondary | ICD-10-CM

## 2021-04-19 DIAGNOSIS — L821 Other seborrheic keratosis: Secondary | ICD-10-CM

## 2021-04-19 MED ORDER — MUPIROCIN 2 % EX OINT: TOPICAL_OINTMENT | CUTANEOUS | 0 refills | Status: DC

## 2021-04-19 NOTE — Progress Notes (Signed)
New Patient Visit  Subjective  Mark Martinez is a 70 y.o. male who presents for the following: Annual Exam (Check sun exposed areas ). Pt c/o several irritated growths on his arms and back, area on the back drained yesterday.  Pt currently taking Azithromycin prescribed by his PCP for a different condition. The patient presents for Upper Body Skin Exam (UBSE) for skin cancer screening and mole check.  The following portions of the chart were reviewed this encounter and updated as appropriate:   Tobacco  Allergies  Meds  Problems  Med Hx  Surg Hx  Fam Hx     Review of Systems:  No other skin or systemic complaints except as noted in HPI or Assessment and Plan.  Objective  Well appearing patient in no apparent distress; mood and affect are within normal limits.  All skin waist up examined.  face Mid face erythema with telangiectasias +/- scattered inflammatory papules.   chest, back,arms Stuck-on, waxy, tan-brown papule or plaque --Discussed benign etiology and prognosis.   forearms 5 (5) Erythematous keratotic or waxy stuck-on papule or plaque.   Mid Back Subcutaneous papule/nodule with erythema and edema, tender to touch.    Assessment & Plan  Rosacea face Rosacea is a chronic progressive skin condition usually affecting the face of adults, causing redness and/or acne bumps. It is treatable but not curable. It sometimes affects the eyes (ocular rosacea) as well. It may respond to topical and/or systemic medication and can flare with stress, sun exposure, alcohol, exercise and some foods.  Daily application of broad spectrum spf 30+ sunscreen to face is recommended to reduce flares.   Discussed BBL / laser - pt declines tx.  Seborrheic keratosis chest, back,arms Reassured benign age-related growth.  Recommend observation.  Discussed cryotherapy if spot(s) become irritated or inflamed.   Inflamed seborrheic keratosis forearms x5 Reassured benign age-related  growth.  Recommend observation.  Discussed cryotherapy if spot(s) become irritated or inflamed.   Prior to procedure, discussed risks of blister formation, small wound, skin dyspigmentation, or rare scar following cryotherapy. Recommend Vaseline ointment to treated areas while healing.   Destruction of lesion - forearms 5 Complexity: simple   Destruction method: cryotherapy   Informed consent: discussed and consent obtained   Timeout:  patient name, date of birth, surgical site, and procedure verified Lesion destroyed using liquid nitrogen: Yes   Region frozen until ice ball extended beyond lesion: Yes   Outcome: patient tolerated procedure well with no complications   Post-procedure details: wound care instructions given    Epidermal inclusion cyst with inflammation; rupture and abscess formation with cellulitis Mid Back Benign-appearing. Exam most consistent with an epidermal inclusion cyst. Discussed that a cyst is a benign growth that can grow over time and sometimes get irritated or inflamed. Recommend observation if it is not bothersome. Discussed option of surgical excision to remove it if it is growing, symptomatic, or other changes noted. Please call for new or changing lesions so they can be evaluated.  Cont taking Azithromycin as prescribed by his PCP Start Mupirocin ointment apply to skin during each band aid change  Incision and Drainage - Mid Back -complicated Location: mid back   Informed Consent: Discussed risks (permanent scarring, light or dark discoloration, infection, pain, bleeding, bruising, redness, damage to adjacent structures, and recurrence of the lesion) and benefits of the procedure, as well as the alternatives.  Informed consent was obtained.  Preparation: The area was prepped with alcohol.  Anesthesia: none  Procedure Details: An incision was made overlying the lesion. The lesion drained blood.  A small amount of fluid was drained.    Antibiotic  ointment and a sterile pressure dressing were applied. The patient tolerated procedure well.  Total number of lesions drained: 1  Plan: The patient was instructed on post-op care. Recommend OTC analgesia as needed for pain.  Related Medications mupirocin ointment (BACTROBAN) 2 % Apply to skin qd-bid  Return in about 1 year (around 04/19/2022) for TBSE.  IMarye Round, CMA, am acting as scribe for Sarina Ser, MD .  Documentation: I have reviewed the above documentation for accuracy and completeness, and I agree with the above.  Sarina Ser, MD

## 2021-04-19 NOTE — Patient Instructions (Signed)

## 2021-04-20 ENCOUNTER — Encounter: Payer: Self-pay | Admitting: Dermatology

## 2021-05-03 ENCOUNTER — Telehealth: Payer: Self-pay | Admitting: Family Medicine

## 2021-05-03 DIAGNOSIS — R052 Subacute cough: Secondary | ICD-10-CM

## 2021-05-03 DIAGNOSIS — R0600 Dyspnea, unspecified: Secondary | ICD-10-CM

## 2021-05-03 DIAGNOSIS — J22 Unspecified acute lower respiratory infection: Secondary | ICD-10-CM

## 2021-05-03 NOTE — Telephone Encounter (Signed)
Spoke with pt relaying Dr. Synthia Innocent message.  Pt verbalizes understanding and will try to come in tomorrow at [facility].

## 2021-05-03 NOTE — Addendum Note (Signed)
Addended by: Ria Bush on: 05/03/2021 02:19 PM   Modules accepted: Orders

## 2021-05-03 NOTE — Telephone Encounter (Signed)
  Encourage patient to contact the pharmacy for refills or they can request refills through [place]:  Please schedule appointment if longer than 1 year  NEXT APPOINTMENT DATE:06/16/21  MEDICATION:predniSONE (DELTASONE) 20 MG tablet,azithromycin (ZITHROMAX) 250 MG tablet    Is the patient out of medication?   PHARMACY:[id number] [address], [place] - [phone]  Let patient know to contact pharmacy at the end of the day to make sure medication is ready.  Please notify patient to allow 48-72 hours to process  CLINICAL FILLS OUT ALL BELOW:   LAST REFILL:  QTY:  REFILL DATE:    OTHER COMMENTS:    Okay for refill?  Please advise

## 2021-05-03 NOTE — Telephone Encounter (Signed)
Spoke with pt asking for update on sxs.  States he's feeling better but still has nasal drainage, prod cough, with thick mucous and feels winded after moving around.  Pt states he just returned from a hunting trip that may be delaying him feeling better.  He stopped taking Mucinex last week.  Says he needs something to break up the mucous more.  Plz advise.  Gives permission to lvm.

## 2021-05-03 NOTE — Telephone Encounter (Signed)
Please have him come in for CXR. Ordered.

## 2021-05-04 ENCOUNTER — Telehealth: Payer: Self-pay | Admitting: Family Medicine

## 2021-05-04 ENCOUNTER — Ambulatory Visit (INDEPENDENT_AMBULATORY_CARE_PROVIDER_SITE_OTHER)
Admission: RE | Admit: 2021-05-04 | Discharge: 2021-05-04 | Disposition: A | Discharge status: Home / Self Care | Payer: Medicare HMO | Source: Ambulatory Visit | Attending: Family Medicine | Admitting: Family Medicine

## 2021-05-04 DIAGNOSIS — J22 Unspecified acute lower respiratory infection: Secondary | ICD-10-CM | POA: Diagnosis not present

## 2021-05-04 DIAGNOSIS — R0600 Dyspnea, unspecified: Secondary | ICD-10-CM

## 2021-05-04 DIAGNOSIS — R911 Solitary pulmonary nodule: Secondary | ICD-10-CM

## 2021-05-04 DIAGNOSIS — R052 Subacute cough: Secondary | ICD-10-CM

## 2021-05-04 DIAGNOSIS — R059 Cough, unspecified: Secondary | ICD-10-CM | POA: Diagnosis not present

## 2021-05-04 NOTE — Telephone Encounter (Signed)
Called and LMOVM and at home identifying myself and letting him know I was calling on behalf of Dr. Darnell Level re: his results.  I didn't leave specific info about the CXR or plan for f/u imaging/labs.  I will defer to Dr. Darnell Level re: f/u and orders.  Dr. Darnell Level is aware of the plan and will manage going forward.

## 2021-05-04 NOTE — Telephone Encounter (Signed)
Released results via mychart I have ordered STAT CT and Cr .

## 2021-05-04 NOTE — Telephone Encounter (Signed)
Diane with [facility] called to flag an urgent Xray result on pt for covering provider for Dr Darnell Level today. Will flag for Dr Damita Dunnings to review.

## 2021-05-05 ENCOUNTER — Other Ambulatory Visit: Payer: Self-pay

## 2021-05-05 ENCOUNTER — Other Ambulatory Visit (INDEPENDENT_AMBULATORY_CARE_PROVIDER_SITE_OTHER): Payer: Medicare HMO

## 2021-05-05 DIAGNOSIS — R911 Solitary pulmonary nodule: Secondary | ICD-10-CM | POA: Diagnosis not present

## 2021-05-05 LAB — BASIC METABOLIC PANEL
BUN: 17 mg/dL (ref 7–25)
CO2: 28 mmol/L (ref 20–32)
Calcium: 9.2 mg/dL (ref 8.6–10.3)
Chloride: 102 mmol/L (ref 98–110)
Creat: 1.02 mg/dL (ref 0.70–1.35)
Glucose, Bld: 99 mg/dL (ref 65–99)
Potassium: 4.9 mmol/L (ref 3.5–5.3)
Sodium: 137 mmol/L (ref 135–146)

## 2021-05-05 NOTE — Addendum Note (Signed)
Addended by: Leeanne Rio on: 05/05/2021 12:02 PM   Modules accepted: Orders

## 2021-05-05 NOTE — Telephone Encounter (Signed)
Pt called returning a call about results

## 2021-05-05 NOTE — Telephone Encounter (Signed)
Spoke with pt.  (See Imaging, 05/04/21)

## 2021-05-09 ENCOUNTER — Other Ambulatory Visit: Payer: Self-pay

## 2021-05-09 ENCOUNTER — Other Ambulatory Visit: Payer: Self-pay | Admitting: Family Medicine

## 2021-05-09 ENCOUNTER — Telehealth: Payer: Self-pay

## 2021-05-09 ENCOUNTER — Ambulatory Visit (INDEPENDENT_AMBULATORY_CARE_PROVIDER_SITE_OTHER)
Admission: RE | Admit: 2021-05-09 | Discharge: 2021-05-09 | Disposition: A | Discharge status: Home / Self Care | Payer: Medicare HMO | Source: Ambulatory Visit | Attending: Family Medicine | Admitting: Family Medicine

## 2021-05-09 DIAGNOSIS — R1011 Right upper quadrant pain: Secondary | ICD-10-CM

## 2021-05-09 DIAGNOSIS — R911 Solitary pulmonary nodule: Secondary | ICD-10-CM | POA: Diagnosis not present

## 2021-05-09 DIAGNOSIS — K449 Diaphragmatic hernia without obstruction or gangrene: Secondary | ICD-10-CM | POA: Diagnosis not present

## 2021-05-09 DIAGNOSIS — J984 Other disorders of lung: Secondary | ICD-10-CM | POA: Diagnosis not present

## 2021-05-09 DIAGNOSIS — R932 Abnormal findings on diagnostic imaging of liver and biliary tract: Secondary | ICD-10-CM

## 2021-05-09 DIAGNOSIS — R7401 Elevation of levels of liver transaminase levels: Secondary | ICD-10-CM

## 2021-05-09 DIAGNOSIS — J40 Bronchitis, not specified as acute or chronic: Secondary | ICD-10-CM | POA: Diagnosis not present

## 2021-05-09 MED ORDER — IOHEXOL 300 MG/ML  SOLN
80.0000 mL | Freq: Once | INTRAMUSCULAR | Status: AC | PRN
  Administered 2021-05-09: 80 mL via INTRAVENOUS

## 2021-05-09 NOTE — Telephone Encounter (Signed)
Noted. Spoke with patient. Ordered CT abd/pelvis for further evaluation.

## 2021-05-09 NOTE — Telephone Encounter (Signed)
Narda Rutherford called to make sure Dr Darnell Level sees CT Report.   Abnormal, nodular and stranded appearance of the gallbladder fossa and adjacent fat in the included upper abdomen. This is of uncertain significance, concerning potentially for colon malignancy. Recommend contrast enhanced CT examination of the abdomen and pelvis to further evaluate.

## 2021-05-10 NOTE — Telephone Encounter (Signed)
Pt returned call . Would like a call back regarding CT scan

## 2021-05-10 NOTE — Telephone Encounter (Signed)
Spoke with pt.  States he got a vm from [place] to call back.

## 2021-05-16 NOTE — Telephone Encounter (Signed)
Pt has an appt on 05/19/21 for the CT

## 2021-05-17 ENCOUNTER — Ambulatory Visit: Payer: Medicare HMO | Admitting: Dermatology

## 2021-05-19 ENCOUNTER — Ambulatory Visit (INDEPENDENT_AMBULATORY_CARE_PROVIDER_SITE_OTHER)
Admission: RE | Admit: 2021-05-19 | Discharge: 2021-05-19 | Disposition: A | Discharge status: Home / Self Care | Payer: Medicare HMO | Source: Ambulatory Visit | Attending: Family Medicine | Admitting: Family Medicine

## 2021-05-19 ENCOUNTER — Other Ambulatory Visit: Payer: Self-pay

## 2021-05-19 DIAGNOSIS — R932 Abnormal findings on diagnostic imaging of liver and biliary tract: Secondary | ICD-10-CM | POA: Diagnosis not present

## 2021-05-19 DIAGNOSIS — R1011 Right upper quadrant pain: Secondary | ICD-10-CM | POA: Diagnosis not present

## 2021-05-19 DIAGNOSIS — R109 Unspecified abdominal pain: Secondary | ICD-10-CM | POA: Diagnosis not present

## 2021-05-19 DIAGNOSIS — R7401 Elevation of levels of liver transaminase levels: Secondary | ICD-10-CM

## 2021-05-19 MED ORDER — IOHEXOL 300 MG/ML  SOLN
100.0000 mL | Freq: Once | INTRAMUSCULAR | Status: AC | PRN
  Administered 2021-05-19: 100 mL via INTRAVENOUS

## 2021-05-22 ENCOUNTER — Telehealth: Payer: Self-pay | Admitting: *Deleted

## 2021-05-22 DIAGNOSIS — K769 Liver disease, unspecified: Secondary | ICD-10-CM

## 2021-05-22 NOTE — Telephone Encounter (Signed)
Diane with [facility] called stating that she has a call report.on a CT that the patient had.done. Diane stated the results are in Epic for the PCP to review.

## 2021-05-22 NOTE — Telephone Encounter (Signed)
Spoke with patient. Will proceed with MRI. Will forward to referral pool - I'd like to get done in the next few wks.

## 2021-05-22 NOTE — Telephone Encounter (Signed)
See result note. I've been trying to reach patient.  Will try again later.

## 2021-05-23 DIAGNOSIS — H02834 Dermatochalasis of left upper eyelid: Secondary | ICD-10-CM | POA: Diagnosis not present

## 2021-05-23 DIAGNOSIS — H2513 Age-related nuclear cataract, bilateral: Secondary | ICD-10-CM | POA: Diagnosis not present

## 2021-05-23 DIAGNOSIS — H5201 Hypermetropia, right eye: Secondary | ICD-10-CM | POA: Diagnosis not present

## 2021-05-23 DIAGNOSIS — H02831 Dermatochalasis of right upper eyelid: Secondary | ICD-10-CM | POA: Diagnosis not present

## 2021-05-25 NOTE — Telephone Encounter (Signed)
Attempted [place] online through [employer] and received  The code is invalid or it is a code that does not require an authorization through [employer].  [facility] and Josem Kaufmann is required, and can be done over the phone [phone] Scan needs to be done before 06/17/21 as insurance will end. Ref# [id number]  NO Josem Kaufmann is required.  Ref# [id number]  Patient is aware , he is going to call to schedule this with [facility].

## 2021-05-25 NOTE — Telephone Encounter (Signed)
Noted! Thank you

## 2021-06-08 ENCOUNTER — Ambulatory Visit
Admission: RE | Admit: 2021-06-08 | Discharge: 2021-06-08 | Disposition: A | Discharge status: Home / Self Care | Payer: Medicare HMO | Source: Ambulatory Visit | Attending: Family Medicine | Admitting: Family Medicine

## 2021-06-08 ENCOUNTER — Other Ambulatory Visit: Payer: Self-pay

## 2021-06-08 ENCOUNTER — Telehealth: Payer: Self-pay

## 2021-06-08 DIAGNOSIS — N281 Cyst of kidney, acquired: Secondary | ICD-10-CM | POA: Diagnosis not present

## 2021-06-08 DIAGNOSIS — Z125 Encounter for screening for malignant neoplasm of prostate: Secondary | ICD-10-CM

## 2021-06-08 DIAGNOSIS — Z9049 Acquired absence of other specified parts of digestive tract: Secondary | ICD-10-CM | POA: Diagnosis not present

## 2021-06-08 DIAGNOSIS — K769 Liver disease, unspecified: Secondary | ICD-10-CM | POA: Diagnosis not present

## 2021-06-08 DIAGNOSIS — R7303 Prediabetes: Secondary | ICD-10-CM

## 2021-06-08 DIAGNOSIS — E785 Hyperlipidemia, unspecified: Secondary | ICD-10-CM

## 2021-06-08 DIAGNOSIS — K750 Abscess of liver: Secondary | ICD-10-CM | POA: Insufficient documentation

## 2021-06-08 DIAGNOSIS — R932 Abnormal findings on diagnostic imaging of liver and biliary tract: Secondary | ICD-10-CM

## 2021-06-08 MED ORDER — GADOBUTROL 1 MMOL/ML IV SOLN
10.0000 mL | Freq: Once | INTRAVENOUS | Status: AC | PRN
  Administered 2021-06-08: 09:00:00 10 mL via INTRAVENOUS

## 2021-06-08 NOTE — Telephone Encounter (Signed)
I talked to Dr. Darnell Level and he is going to address.  I thank all involved.

## 2021-06-08 NOTE — Telephone Encounter (Signed)
Mark Martinez with [facility] radiology called report; report is in Epic for MRI of liver W and WO contrast. Dr Darnell Level is out of office; spoke with Valinda Hoar CMA and sending note to Dr Darnell Level, Dr Damita Dunnings and Lattie Haw CMA.

## 2021-06-08 NOTE — Telephone Encounter (Signed)
Spoke with patient. Will send to IR to discuss biopsy.  Will bring him in tomorrow for fasting labs for upcoming physical as well as further labs for recent abnormal gallbladder on CT/MRI

## 2021-06-09 ENCOUNTER — Other Ambulatory Visit (INDEPENDENT_AMBULATORY_CARE_PROVIDER_SITE_OTHER): Payer: Medicare HMO

## 2021-06-09 DIAGNOSIS — R1011 Right upper quadrant pain: Secondary | ICD-10-CM | POA: Diagnosis not present

## 2021-06-09 DIAGNOSIS — R932 Abnormal findings on diagnostic imaging of liver and biliary tract: Secondary | ICD-10-CM

## 2021-06-09 DIAGNOSIS — Z125 Encounter for screening for malignant neoplasm of prostate: Secondary | ICD-10-CM | POA: Diagnosis not present

## 2021-06-09 DIAGNOSIS — E785 Hyperlipidemia, unspecified: Secondary | ICD-10-CM | POA: Diagnosis not present

## 2021-06-09 DIAGNOSIS — Q441 Other congenital malformations of gallbladder: Secondary | ICD-10-CM | POA: Diagnosis not present

## 2021-06-09 DIAGNOSIS — B999 Unspecified infectious disease: Secondary | ICD-10-CM | POA: Diagnosis not present

## 2021-06-09 DIAGNOSIS — R7303 Prediabetes: Secondary | ICD-10-CM | POA: Diagnosis not present

## 2021-06-09 LAB — LIPID PANEL
Cholesterol: 140 mg/dL (ref 0–200)
HDL: 49.7 mg/dL (ref >39.00)
LDL Cholesterol: 73 mg/dL (ref 0–99)
NonHDL: 90.58
Total CHOL/HDL Ratio: 3
Triglycerides: 87 mg/dL (ref 0.0–149.0)
VLDL: 17.4 mg/dL (ref 0.0–40.0)

## 2021-06-09 LAB — COMPREHENSIVE METABOLIC PANEL
ALT: 19 U/L (ref 0–53)
AST: 20 U/L (ref 0–37)
Albumin: 3.9 g/dL (ref 3.5–5.2)
Alkaline Phosphatase: 181 U/L — ABNORMAL HIGH (ref 39–117)
BUN: 13 mg/dL (ref 6–23)
CO2: 28 mEq/L (ref 19–32)
Calcium: 9.5 mg/dL (ref 8.4–10.5)
Chloride: 100 mEq/L (ref 96–112)
Creatinine, Ser: 0.96 mg/dL (ref 0.40–1.50)
GFR: 80.86 mL/min (ref >60.00)
Glucose, Bld: 98 mg/dL (ref 70–99)
Potassium: 4.7 mEq/L (ref 3.5–5.1)
Sodium: 137 mEq/L (ref 135–145)
Total Bilirubin: 0.7 mg/dL (ref 0.2–1.2)
Total Protein: 7.5 g/dL (ref 6.0–8.3)

## 2021-06-09 LAB — CBC WITH DIFFERENTIAL/PLATELET
Basophils Absolute: 0 10*3/uL (ref 0.0–0.1)
Basophils Relative: 0.3 % (ref 0.0–3.0)
Eosinophils Absolute: 0.2 10*3/uL (ref 0.0–0.7)
Eosinophils Relative: 1.8 % (ref 0.0–5.0)
HCT: 45.1 % (ref 39.0–52.0)
Hemoglobin: 15.1 g/dL (ref 13.0–17.0)
Lymphocytes Relative: 15.9 % (ref 12.0–46.0)
Lymphs Abs: 1.5 10*3/uL (ref 0.7–4.0)
MCHC: 33.4 g/dL (ref 30.0–36.0)
MCV: 88.2 fl (ref 78.0–100.0)
Monocytes Absolute: 0.7 10*3/uL (ref 0.1–1.0)
Monocytes Relative: 7.4 % (ref 3.0–12.0)
Neutro Abs: 7 10*3/uL (ref 1.4–7.7)
Neutrophils Relative %: 74.6 % (ref 43.0–77.0)
Platelets: 380 10*3/uL (ref 150.0–400.0)
RBC: 5.12 Mil/uL (ref 4.22–5.81)
RDW: 13.5 % (ref 11.5–15.5)
WBC: 9.4 10*3/uL (ref 4.0–10.5)

## 2021-06-09 LAB — PSA: PSA: 0.21 ng/mL (ref 0.10–4.00)

## 2021-06-09 LAB — HEMOGLOBIN A1C: Hgb A1c MFr Bld: 6.4 % (ref 4.6–6.5)

## 2021-06-09 LAB — SEDIMENTATION RATE: Sed Rate: 97 mm/hr — ABNORMAL HIGH (ref 0–20)

## 2021-06-09 LAB — C-REACTIVE PROTEIN: CRP: 7.9 mg/dL (ref 0.5–20.0)

## 2021-06-14 LAB — CEA: CEA: <0.5 ng/mL

## 2021-06-14 LAB — CANCER ANTIGEN 19-9: CA 19-9: 9 U/mL (ref <34)

## 2021-06-14 NOTE — Addendum Note (Signed)
Addended by: Ria Bush on: 06/14/2021 05:33 PM   Modules accepted: Orders

## 2021-06-14 NOTE — Progress Notes (Signed)
Subjective:   Mark Martinez is a 70 y.o. male who presents for Medicare Annual/Subsequent preventive examination.  I connected with Charlann Noss today by telephone and verified that I am speaking with the correct person using two identifiers. Location patient: home Location provider: work Persons participating in the virtual visit: patient, Marine scientist.    I discussed the limitations, risks, security and privacy concerns of performing an evaluation and management service by telephone and the availability of in person appointments. I also discussed with the patient that there may be a patient responsible charge related to this service. The patient expressed understanding and verbally consented to this telephonic visit.    Interactive audio and video telecommunications were attempted between this provider and patient, however failed, due to patient having technical difficulties OR patient did not have access to video capability.  We continued and completed visit with audio only.  Some vital signs may be absent or patient reported.   Time Spent with patient on telephone encounter: 20 minutes  Review of Systems     Cardiac Risk Factors include: advanced age (>16men, >20 women);dyslipidemia     Objective:    Today's Vitals   06/16/21 1157  Weight: 211 lb (95.7 kg)  Height: 6' (1.829 m)   Body mass index is 28.62 kg/m.  Advanced Directives 06/16/2021 06/15/2020 05/10/2020 05/27/2019 08/26/2013  Does Patient Have a Medical Advance Directive? Yes Yes Yes Yes Patient would not like information;Patient does not have advance directive  Type of Advance Directive [place];Living will [place];Living will Living will Living will -  Does patient want to make changes to medical advance directive? Yes (MAU/Ambulatory/Procedural Areas - Information given) - No - Patient declined - -  Copy of [place] in Chart? - No - copy requested - - -     Current Medications (verified) Outpatient Encounter Medications as of 06/16/2021  Medication Sig   acetaminophen (TYLENOL) 325 MG tablet Take 650 mg by mouth every 6 (six) hours as needed for moderate pain.    albuterol (VENTOLIN HFA) 108 (90 Base) MCG/ACT inhaler Inhale into the lungs.   cholestyramine (QUESTRAN) 4 g packet Take 1 packet (4 g total) by mouth 2 (two) times daily before a meal.   esomeprazole (NEXIUM) 40 MG capsule Take 1 capsule (40 mg total) by mouth daily at 12 noon.   mupirocin ointment (BACTROBAN) 2 % Apply to skin qd-bid   promethazine-dextromethorphan (PROMETHAZINE-DM) 6.25-15 MG/5ML syrup Take 5 mLs by mouth 3 (three) times daily as needed for cough (sedation precautions).   sildenafil (REVATIO) 20 MG tablet Take 3-5 tablets (60-100 mg total) by mouth daily as needed (relations).   No facility-administered encounter medications on file as of 06/16/2021.    Allergies (verified) Codeine   History: Past Medical History:  Diagnosis Date   Cataract    COVID-19 virus infection 06/2019   ED (erectile dysfunction)    Elevated LFTs    Esophageal stricture    GERD (gastroesophageal reflux disease)    with hiatal hernia   H/O hiatal hernia    History of prostatitis    HLD (hyperlipidemia)    Prediabetes 07/28/2015   Shortness of breath    exersion   Tubular adenoma of colon    Past Surgical History:  Procedure Laterality Date   BACK SURGERY  1994   lumbar area   CHOLECYSTECTOMY     COLONOSCOPY  2005   WNL, rpt 10 yrs   COLONOSCOPY  07/2019   1 TA, hemorrhoids, rpt 7 yrs [name])   CYST REMOVAL NECK Left 08/31/2013   Procedure: EXCISION EPIDERMAL INCLUSION CYST LEFT NECK;  Surgeon: Zenovia Jarred, MD   ESOPHAGEAL DILATION     three last   ESOPHAGOGASTRODUODENOSCOPY  12-27-1996   hiatal hernia (Dr. Sharlett Iles)   SHOULDER ARTHROSCOPY WITH ROTATOR CUFF REPAIR AND SUBACROMIAL DECOMPRESSION Right 01/2020   distal clavicle resection, RTC repair ([place])    Family History  Problem Relation Age of Onset   Valvular heart disease Mother    Ulcers Father    CAD Father 64       multiple stents   CAD Paternal Uncle    Cancer Neg Hx    Diabetes Neg Hx    Stroke Neg Hx    Colon cancer Neg Hx    Esophageal cancer Neg Hx    Pancreatic cancer Neg Hx    Liver disease Neg Hx    Stomach cancer Neg Hx    Social History   Socioeconomic History   Marital status: Married    Spouse name: Not on file   Number of children: 3   Years of education: Not on file   Highest education level: Not on file  Occupational History   Occupation: seeding and landscaping services    Employer: OTHER  Tobacco Use   Smoking status: Never   Smokeless tobacco: Former    Quit date: 01/30/2009  Vaping Use   Vaping Use: Never used  Substance and Sexual Activity   Alcohol use: No   Drug use: No   Sexual activity: Not Currently  Other Topics Concern   Not on file  Social History Narrative   Caffeine: rare   Lives with wife, 1 dog.   Grown children   Occupation: Works in Biomedical scientist.   Activity: stays active at work   Diet: daily fruits/vegetables, some water   Social Determinants of Health   Financial Resource Strain: Low Risk    Difficulty of Paying Living Expenses: Not hard at all  Food Insecurity: No Food Insecurity   Worried About Charity fundraiser in the Last Year: Never true   Arboriculturist in the Last Year: Never true  Transportation Needs: No Transportation Needs   Lack of Transportation (Medical): No   Lack of Transportation (Non-Medical): No  Physical Activity: Insufficiently Active   Days of Exercise per Week: 3 days   Minutes of Exercise per Session: 20 min  Stress: No Stress Concern Present   Feeling of Stress : Not at all  Social Connections: Socially Integrated   Frequency of Communication with Friends and Family: More than three times a week   Frequency of Social Gatherings with Friends and Family: More than three times a week    Attends Religious Services: More than 4 times per year   Active Member of [employer] or Organizations: Yes   Attends Music therapist: More than 4 times per year   Marital Status: Married    Tobacco Counseling Counseling given: Not Answered   Clinical Intake:  Pre-visit preparation completed: Yes  Pain : No/denies pain     BMI - recorded: 28.62 Nutritional Status: BMI 25 -29 Overweight Nutritional Risks: None Diabetes: No  How often do you need to have someone help you when you read instructions, pamphlets, or other written materials from your doctor or pharmacy?: 1 - Never  Diabetic? No  Interpreter Needed?: No  Information entered by :: Orrin Brigham LPN  Activities of Daily Living In your present state of health, do you have any difficulty performing the following activities: 06/16/2021  Hearing? N  Vision? N  Difficulty concentrating or making decisions? N  Walking or climbing stairs? N  Dressing or bathing? N  Doing errands, shopping? N  Preparing Food and eating ? N  Using the Toilet? N  In the past six months, have you accidently leaked urine? N  Do you have problems with loss of bowel control? N  Managing your Medications? N  Managing your Finances? N  Housekeeping or managing your Housekeeping? N  Some recent data might be hidden    Patient Care Team: Ria Bush, MD as PCP - General  Indicate any recent Medical Services you may have received from other than [facility] providers in the past year (date may be approximate).     Assessment:   This is a routine wellness examination for [place].  Hearing/Vision screen Hearing Screening - Comments:: No issues Vision Screening - Comments:: Last exam 04/2021, Dr. Bing Plume, reading glasses  Dietary issues and exercise activities discussed: Current Exercise Habits: Home exercise routine, Type of exercise: Other - see comments (Total gym machine), Time (Minutes): 20, Frequency (Times/Week): 3,  Weekly Exercise (Minutes/Week): 60, Intensity: Mild   Goals Addressed             This Visit's Progress    Patient Stated       Would like to maintain current routine       Depression Screen PHQ 2/9 Scores 06/16/2021 06/15/2020 04/22/2019 04/15/2018 04/01/2017  PHQ - 2 Score 0 0 0 0 0  PHQ- 9 Score - 0 - - -    Fall Risk Fall Risk  06/16/2021 06/15/2020 05/18/2020 04/22/2019 04/15/2018  Falls in the past year? 0 0 0 0 No  Number falls in past yr: 0 0 - - -  Injury with Fall? 0 0 - - -  Risk for fall due to : No Fall Risks No Fall Risks - - -  Follow up Falls prevention discussed Falls evaluation completed;Falls prevention discussed - - -    FALL RISK PREVENTION PERTAINING TO THE HOME:  Any stairs in or around the home? Yes  If so, are there any without handrails? No Home free of loose throw rugs in walkways, pet beds, electrical cords, etc? Yes  Adequate lighting in your home to reduce risk of falls? Yes   ASSISTIVE DEVICES UTILIZED TO PREVENT FALLS:  Life alert? No  Use of a cane, walker or w/c? No  Grab bars in the bathroom? No  Shower chair or bench in shower? No  Elevated toilet seat or a handicapped toilet? Yes   TIMED UP AND GO:  Was the test performed? No .    Cognitive Function: Normal cognitive status assessed by this Nurse Health Advisor. No abnormalities found.   MMSE - Mini Mental State Exam 06/15/2020  Orientation to time 5  Orientation to Place 5  Registration 3  Attention/ Calculation 5  Recall 3  Language- repeat 1        Immunizations Immunization History  Administered Date(s) Administered   Fluad Quad(high Dose 65+) 03/12/2019, 04/10/2020   Influenza Whole 05/19/2009   Influenza, High Dose Seasonal PF 06/04/2017, 03/21/2018   Influenza, Seasonal, Injecte, Preservative Fre 06/20/2012   Influenza,inj,Quad PF,6+ Mos 03/08/2015, 04/18/2016   Influenza,inj,quad, With Preservative 03/19/2019, 04/06/2020   Influenza-Unspecified  04/14/2013   PFIZER(Purple Top)SARS-COV-2 Vaccination 09/17/2019, 10/17/2019   Pneumococcal Polysaccharide-23 04/22/2019   Td  07/07/2002   Tdap 06/20/2012, 05/27/2019    TDAP status: Up to date  Flu Vaccine status: Due, Education has been provided regarding the importance of this vaccine. Advised may receive this vaccine at local pharmacy or Health Dept. Aware to provide a copy of the vaccination record if obtained from local pharmacy or Health Dept. Verbalized acceptance and understanding.  Pneumococcal vaccine status: Up to date  Covid-19 vaccine status: Information provided on how to obtain vaccines.   Qualifies for Shingles Vaccine? Yes   Zostavax completed No   Shingrix Completed?: No.    Education has been provided regarding the importance of this vaccine. Patient has been advised to call insurance company to determine out of pocket expense if they have not yet received this vaccine. Advised may also receive vaccine at local pharmacy or Health Dept. Verbalized acceptance and understanding.  Screening Tests Health Maintenance  Topic Date Due   Zoster Vaccines- Shingrix (1 of 2) Never done   COVID-19 Vaccine (3 - Pfizer risk series) 11/14/2019   Pneumonia Vaccine 41+ Years old (2 - PCV) 04/21/2020   INFLUENZA VACCINE  09/15/2021 (Originally 01/16/2021)   COLONOSCOPY (Pts 45-63yrs Insurance coverage will need to be confirmed)  07/28/2026   TETANUS/TDAP  05/26/2029   Hepatitis C Screening  Completed   HPV VACCINES  Aged Out    Health Maintenance  Health Maintenance Due  Topic Date Due   Zoster Vaccines- Shingrix (1 of 2) Never done   COVID-19 Vaccine (3 - Pfizer risk series) 11/14/2019   Pneumonia Vaccine 2+ Years old (2 - PCV) 04/21/2020    Colorectal cancer screening: Type of screening: Colonoscopy. Completed 07/29/19. Repeat every 7 years  Lung Cancer Screening: (Low Dose CT Chest recommended if Age 85-80 years, 30 pack-year currently smoking OR have quit w/in 15years.)  does not qualify.     Additional Screening:  Hepatitis C Screening: does qualify; Completed 09/09/20  Vision Screening: Recommended annual ophthalmology exams for early detection of glaucoma and other disorders of the eye. Is the patient up to date with their annual eye exam?  Yes  Who is the provider or what is the name of the office in which the patient attends annual eye exams? Dr. Bing Plume   Dental Screening: Recommended annual dental exams for proper oral hygiene  Community Resource Referral / Chronic Care Management: CRR required this visit?  No   CCM required this visit?  No      Plan:     I have personally reviewed and noted the following in the patients chart:   Medical and social history Use of alcohol, tobacco or illicit drugs  Current medications and supplements including opioid prescriptions. Patient is not currently taking opioid prescriptions. Functional ability and status Nutritional status Physical activity Advanced directives List of other physicians Hospitalizations, surgeries, and ER visits in previous 12 months Vitals Screenings to include cognitive, depression, and falls Referrals and appointments  In addition, I have reviewed and discussed with patient certain preventive protocols, quality metrics, and best practice recommendations. A written personalized care plan for preventive services as well as general preventive health recommendations were provided to patient.   Due to this being a telephonic visit, the after visit summary with patients personalized plan was offered to patient via mail or my-chart. Patient would like to access on my-chart.    Loma Messing, LPN  28/00/3491   Nurse Health Advisor  Nurse Notes: none

## 2021-06-14 NOTE — Telephone Encounter (Signed)
I spoke with interventional radiologist at [facility].  Will order CT guided biopsy.

## 2021-06-15 ENCOUNTER — Other Ambulatory Visit: Payer: Self-pay | Admitting: Internal Medicine

## 2021-06-16 ENCOUNTER — Other Ambulatory Visit: Payer: Self-pay | Admitting: Radiology

## 2021-06-16 ENCOUNTER — Ambulatory Visit (INDEPENDENT_AMBULATORY_CARE_PROVIDER_SITE_OTHER): Payer: Medicare HMO

## 2021-06-16 VITALS — Ht 72.0 in | Wt 211.0 lb

## 2021-06-16 DIAGNOSIS — Z0000 Encounter for general adult medical examination without abnormal findings: Secondary | ICD-10-CM

## 2021-06-16 NOTE — Patient Instructions (Signed)
Mark Martinez , Thank you for taking time to complete your Medicare Wellness Visit. I appreciate your ongoing commitment to your health goals. Please review the following plan we discussed and let me know if I can assist you in the future.   Screening recommendations/referrals: Colonoscopy: up to date, completed 07/29/19, due 07/28/26 Recommended yearly ophthalmology/optometry visit for glaucoma screening and checkup Recommended yearly dental visit for hygiene and checkup  Vaccinations: Influenza vaccine: Due-May obtain vaccine at our office or your local pharmacy. Pneumococcal vaccine: up to date Tdap vaccine: up to date, completed 05/27/19, due 05/26/29 Shingles vaccine: Discuss with your local pharmacy   Covid-19: newest booster available at your local pharmacy  Advanced directives: Please bring a copy of Living Will and/or [place] for your chart.   Conditions/risks identified: see problem list  Next appointment: Follow up in one year for your annual wellness visit. 06/21/22 @ 9:00am, this will be a telephone visit.   Preventive Care 69 Years and Older, Male Preventive care refers to lifestyle choices and visits with your health care provider that can promote health and wellness. What does preventive care include? A yearly physical exam. This is also called an annual well check. Dental exams once or twice a year. Routine eye exams. Ask your health care provider how often you should have your eyes checked. Personal lifestyle choices, including: Daily care of your teeth and gums. Regular physical activity. Eating a healthy diet. Avoiding tobacco and drug use. Limiting alcohol use. Practicing safe sex. Taking low doses of aspirin every day. Taking vitamin and mineral supplements as recommended by your health care provider. What happens during an annual well check? The services and screenings done by your health care provider during your annual well check will depend  on your age, overall health, lifestyle risk factors, and family history of disease. Counseling  Your health care provider may ask you questions about your: Alcohol use. Tobacco use. Drug use. Emotional well-being. Home and relationship well-being. Sexual activity. Eating habits. History of falls. Memory and ability to understand (cognition). Work and work Statistician. Screening  You may have the following tests or measurements: Height, weight, and BMI. Blood pressure. Lipid and cholesterol levels. These may be checked every 5 years, or more frequently if you are over 92 years old. Skin check. Lung cancer screening. You may have this screening every year starting at age 36 if you have a 30-pack-year history of smoking and currently smoke or have quit within the past 15 years. Fecal occult blood test (FOBT) of the stool. You may have this test every year starting at age 47. Flexible sigmoidoscopy or colonoscopy. You may have a sigmoidoscopy every 5 years or a colonoscopy every 10 years starting at age 44. Prostate cancer screening. Recommendations will vary depending on your family history and other risks. Hepatitis C blood test. Hepatitis B blood test. Sexually transmitted disease (STD) testing. Diabetes screening. This is done by checking your blood sugar (glucose) after you have not eaten for a while (fasting). You may have this done every 1-3 years. Abdominal aortic aneurysm (AAA) screening. You may need this if you are a current or former smoker. Osteoporosis. You may be screened starting at age 20 if you are at high risk. Talk with your health care provider about your test results, treatment options, and if necessary, the need for more tests. Vaccines  Your health care provider may recommend certain vaccines, such as: Influenza vaccine. This is recommended every year. Tetanus, diphtheria, and  acellular pertussis (Tdap, Td) vaccine. You may need a Td booster every 10  years. Zoster vaccine. You may need this after age 12. Pneumococcal 13-valent conjugate (PCV13) vaccine. One dose is recommended after age 3. Pneumococcal polysaccharide (PPSV23) vaccine. One dose is recommended after age 56. Talk to your health care provider about which screenings and vaccines you need and how often you need them. This information is not intended to replace advice given to you by your health care provider. Make sure you discuss any questions you have with your health care provider. Document Released: 07/01/2015 Document Revised: 02/22/2016 Document Reviewed: 04/05/2015 Elsevier Interactive Patient Education  2017 [place] Prevention in the Home Falls can cause injuries. They can happen to people of all ages. There are many things you can do to make your home safe and to help prevent falls. What can I do on the outside of my home? Regularly fix the edges of walkways and driveways and fix any cracks. Remove anything that might make you trip as you walk through a door, such as a raised step or threshold. Trim any bushes or trees on the path to your home. Use bright outdoor lighting. Clear any walking paths of anything that might make someone trip, such as rocks or tools. Regularly check to see if handrails are loose or broken. Make sure that both sides of any steps have handrails. Any raised decks and porches should have guardrails on the edges. Have any leaves, snow, or ice cleared regularly. Use sand or salt on walking paths during winter. Clean up any spills in your garage right away. This includes oil or grease spills. What can I do in the bathroom? Use night lights. Install grab bars by the toilet and in the tub and shower. Do not use towel bars as grab bars. Use non-skid mats or decals in the tub or shower. If you need to sit down in the shower, use a plastic, non-slip stool. Keep the floor dry. Clean up any water that spills on the floor as soon as it  happens. Remove soap buildup in the tub or shower regularly. Attach bath mats securely with double-sided non-slip rug tape. Do not have throw rugs and other things on the floor that can make you trip. What can I do in the bedroom? Use night lights. Make sure that you have a light by your bed that is easy to reach. Do not use any sheets or blankets that are too big for your bed. They should not hang down onto the floor. Have a firm chair that has side arms. You can use this for support while you get dressed. Do not have throw rugs and other things on the floor that can make you trip. What can I do in the kitchen? Clean up any spills right away. Avoid walking on wet floors. Keep items that you use a lot in easy-to-reach places. If you need to reach something above you, use a strong step stool that has a grab bar. Keep electrical cords out of the way. Do not use floor polish or wax that makes floors slippery. If you must use wax, use non-skid floor wax. Do not have throw rugs and other things on the floor that can make you trip. What can I do with my stairs? Do not leave any items on the stairs. Make sure that there are handrails on both sides of the stairs and use them. Fix handrails that are broken or loose. Make sure that  handrails are as long as the stairways. Check any carpeting to make sure that it is firmly attached to the stairs. Fix any carpet that is loose or worn. Avoid having throw rugs at the top or bottom of the stairs. If you do have throw rugs, attach them to the floor with carpet tape. Make sure that you have a light switch at the top of the stairs and the bottom of the stairs. If you do not have them, ask someone to add them for you. What else can I do to help prevent falls? Wear shoes that: Do not have high heels. Have rubber bottoms. Are comfortable and fit you well. Are closed at the toe. Do not wear sandals. If you use a stepladder: Make sure that it is fully opened.  Do not climb a closed stepladder. Make sure that both sides of the stepladder are locked into place. Ask someone to hold it for you, if possible. Clearly mark and make sure that you can see: Any grab bars or handrails. First and last steps. Where the edge of each step is. Use tools that help you move around (mobility aids) if they are needed. These include: Canes. Walkers. Scooters. Crutches. Turn on the lights when you go into a dark area. Replace any light bulbs as soon as they burn out. Set up your furniture so you have a clear path. Avoid moving your furniture around. If any of your floors are uneven, fix them. If there are any pets around you, be aware of where they are. Review your medicines with your doctor. Some medicines can make you feel dizzy. This can increase your chance of falling. Ask your doctor what other things that you can do to help prevent falls. This information is not intended to replace advice given to you by your health care provider. Make sure you discuss any questions you have with your health care provider. Document Released: 03/31/2009 Document Revised: 11/10/2015 Document Reviewed: 07/09/2014 Elsevier Interactive Patient Education  2017 [employer].

## 2021-06-16 NOTE — Progress Notes (Signed)
Patient on schedule for CT Liver biopsy 06/20/2021, called and spoke with patient on phone with pre procedure instructions given. Made aware to be here @ 0930, NPO after MN prior to procedure and driver post procedure/recovery/discharge. Stated understanding.

## 2021-06-20 ENCOUNTER — Other Ambulatory Visit: Payer: Self-pay | Admitting: Interventional Radiology

## 2021-06-20 ENCOUNTER — Ambulatory Visit
Admission: RE | Admit: 2021-06-20 | Discharge: 2021-06-20 | Disposition: A | Discharge status: Home / Self Care | Payer: Medicare HMO | Source: Ambulatory Visit | Attending: Interventional Radiology | Admitting: Interventional Radiology

## 2021-06-20 ENCOUNTER — Other Ambulatory Visit: Payer: Self-pay | Admitting: Family Medicine

## 2021-06-20 ENCOUNTER — Other Ambulatory Visit: Payer: Self-pay

## 2021-06-20 ENCOUNTER — Ambulatory Visit
Admission: RE | Admit: 2021-06-20 | Discharge: 2021-06-20 | Disposition: A | Discharge status: Home / Self Care | Payer: Medicare HMO | Source: Ambulatory Visit | Attending: Family Medicine | Admitting: Family Medicine

## 2021-06-20 DIAGNOSIS — R932 Abnormal findings on diagnostic imaging of liver and biliary tract: Secondary | ICD-10-CM

## 2021-06-20 DIAGNOSIS — K750 Abscess of liver: Secondary | ICD-10-CM | POA: Insufficient documentation

## 2021-06-20 DIAGNOSIS — R7989 Other specified abnormal findings of blood chemistry: Secondary | ICD-10-CM | POA: Diagnosis present

## 2021-06-20 DIAGNOSIS — K6811 Postprocedural retroperitoneal abscess: Secondary | ICD-10-CM | POA: Diagnosis not present

## 2021-06-20 DIAGNOSIS — R1011 Right upper quadrant pain: Secondary | ICD-10-CM | POA: Diagnosis present

## 2021-06-20 DIAGNOSIS — T8143XA Infection following a procedure, organ and space surgical site, initial encounter: Secondary | ICD-10-CM | POA: Diagnosis not present

## 2021-06-20 LAB — COMPREHENSIVE METABOLIC PANEL
ALT: 31 U/L (ref 0–44)
AST: 32 U/L (ref 15–41)
Albumin: 3.4 g/dL — ABNORMAL LOW (ref 3.5–5.0)
Alkaline Phosphatase: 182 U/L — ABNORMAL HIGH (ref 38–126)
Anion gap: 7 (ref 5–15)
BUN: 21 mg/dL (ref 8–23)
CO2: 25 mmol/L (ref 22–32)
Calcium: 8.8 mg/dL — ABNORMAL LOW (ref 8.9–10.3)
Chloride: 104 mmol/L (ref 98–111)
Creatinine, Ser: 1.04 mg/dL (ref 0.61–1.24)
GFR, Estimated: >60 mL/min (ref >60)
Glucose, Bld: 109 mg/dL — ABNORMAL HIGH (ref 70–99)
Potassium: 4.5 mmol/L (ref 3.5–5.1)
Sodium: 136 mmol/L (ref 135–145)
Total Bilirubin: 0.7 mg/dL (ref 0.3–1.2)
Total Protein: 7.8 g/dL (ref 6.5–8.1)

## 2021-06-20 LAB — CBC WITH DIFFERENTIAL/PLATELET
Abs Immature Granulocytes: 0.02 10*3/uL (ref 0.00–0.07)
Basophils Absolute: 0 10*3/uL (ref 0.0–0.1)
Basophils Relative: 0 %
Eosinophils Absolute: 0.2 10*3/uL (ref 0.0–0.5)
Eosinophils Relative: 3 %
HCT: 49.6 % (ref 39.0–52.0)
Hemoglobin: 15.9 g/dL (ref 13.0–17.0)
Immature Granulocytes: 0 %
Lymphocytes Relative: 18 %
Lymphs Abs: 1.5 10*3/uL (ref 0.7–4.0)
MCH: 28.3 pg (ref 26.0–34.0)
MCHC: 32.1 g/dL (ref 30.0–36.0)
MCV: 88.4 fL (ref 80.0–100.0)
Monocytes Absolute: 0.8 10*3/uL (ref 0.1–1.0)
Monocytes Relative: 10 %
Neutro Abs: 5.7 10*3/uL (ref 1.7–7.7)
Neutrophils Relative %: 69 %
Platelets: 348 10*3/uL (ref 150–400)
RBC: 5.61 MIL/uL (ref 4.22–5.81)
RDW: 13.3 % (ref 11.5–15.5)
WBC: 8.3 10*3/uL (ref 4.0–10.5)
nRBC: 0 % (ref 0.0–0.2)

## 2021-06-20 LAB — PROTIME-INR
INR: 1.2 (ref 0.8–1.2)
Prothrombin Time: 15.2 seconds (ref 11.4–15.2)

## 2021-06-20 MED ORDER — FENTANYL CITRATE (PF) 100 MCG/2ML IJ SOLN
INTRAMUSCULAR | Status: AC
  Filled 2021-06-20: qty 2

## 2021-06-20 MED ORDER — SODIUM CHLORIDE 0.9 % IV SOLN: INTRAVENOUS | Status: DC

## 2021-06-20 MED ORDER — FENTANYL CITRATE (PF) 100 MCG/2ML IJ SOLN
INTRAMUSCULAR | Status: AC | PRN
  Administered 2021-06-20: 25 ug via INTRAVENOUS
  Administered 2021-06-20: 50 ug via INTRAVENOUS

## 2021-06-20 MED ORDER — MIDAZOLAM HCL 5 MG/5ML IJ SOLN
INTRAMUSCULAR | Status: AC | PRN
Start: 2021-06-20 — End: 2021-06-20
  Administered 2021-06-20: 1 mg via INTRAVENOUS
  Administered 2021-06-20: .5 mg via INTRAVENOUS

## 2021-06-20 MED ORDER — MIDAZOLAM HCL 2 MG/2ML IJ SOLN
INTRAMUSCULAR | Status: AC
  Filled 2021-06-20: qty 2

## 2021-06-20 NOTE — Progress Notes (Signed)
Patient clinically stable post Abscess drain placement per Dr Earleen Newport, tolerated well, vitals stable pre and post procedure. Received Versed 1.5 mg along with Fentanyl 75 mcg IV for procedure. Wife to bedside post procedure with update given. Report given to [employer] in specials post procedure.

## 2021-06-20 NOTE — Procedures (Signed)
Interventional Radiology Procedure Note  Procedure: Image guided drain placement, 15F drain, into sub-hepatic abscess/intra-hepatic abscess.   To bulb  Complications: None  EBL: None Sample: Culture sent  Recommendations: - Routine drain care, with sterile flushes, record output.  ~5cc daily flush is adequate - drain care education - DC 2 hours when goals met - follow up Cx - routine wound care  Signed,  Dulcy Fanny. Earleen Newport, DO

## 2021-06-20 NOTE — H&P (Signed)
Chief Complaint: Patient was seen in consultation today for gallbladder fossa (liver) mass versus fluid collection at the request of [place]  Referring Physician(s): [place]  Supervising Physician: Corrie Mckusick  Patient Status: [facility] - Out-pt  History of Present Illness: Mark Martinez is a 71 y.o. male with PMHx significant for acute severe calculus cholecystitis s/p laparoscopic cholecystectomy 04/2020 with Dr. Dahlia Byes. The patient is here today for scheduled elective GB fossa (liver) mass biopsy per his family medicine physician. Upon reviewing imaging today with my attending, Dr. Earleen Newport it is undetermined if the GB fossa area of concern is solid versus a fluid collection with possible infection/abscess. The patient did have RUQ pain and abnormal liver labs in 08/2020 and CT imaging was performed at that time without any abnormality seen. He has had a colonoscopy in 07/2019 with 1 polyp removed, pathology without evidence of malignancy, recent tumor marker labs all WNL and Cbc without leukocytosis. The patient admits to ongoing intermittent non significant RUQ pain at times and denies any fever or chills.   The patient denies any current chest pain or shortness of breath. He denies any current blood thinner use, denies any known bleeding or clotting disorder. The patient denies any recent infections, fever or chills. The patient denies any history of sleep apnea or chronic oxygen use. He has no known complications to sedation.    Past Medical History:  Diagnosis Date   Cataract    COVID-19 virus infection 06/2019   ED (erectile dysfunction)    Elevated LFTs    Esophageal stricture    GERD (gastroesophageal reflux disease)    with hiatal hernia   H/O hiatal hernia    History of prostatitis    HLD (hyperlipidemia)    Prediabetes 07/28/2015   Shortness of breath    exersion   Tubular adenoma of colon     Past Surgical History:  Procedure Laterality Date   BACK  SURGERY  1994   lumbar area   CHOLECYSTECTOMY     COLONOSCOPY  2005   WNL, rpt 10 yrs   COLONOSCOPY  07/2019   1 TA, hemorrhoids, rpt 7 yrs [name])   CYST REMOVAL NECK Left 08/31/2013   Procedure: EXCISION EPIDERMAL INCLUSION CYST LEFT NECK;  Surgeon: Zenovia Jarred, MD   ESOPHAGEAL DILATION     three last   ESOPHAGOGASTRODUODENOSCOPY  12-27-1996   hiatal hernia (Dr. Sharlett Iles)   SHOULDER ARTHROSCOPY WITH ROTATOR CUFF REPAIR AND SUBACROMIAL DECOMPRESSION Right 01/2020   distal clavicle resection, RTC repair ([place])    Allergies: Codeine  Medications: Prior to Admission medications   Medication Sig Start Date End Date Taking? Authorizing Provider  acetaminophen (TYLENOL) 325 MG tablet Take 650 mg by mouth every 6 (six) hours as needed for moderate pain.    Yes [provider]  albuterol (VENTOLIN HFA) 108 (90 Base) MCG/ACT inhaler Inhale into the lungs. 04/10/21 04/10/22 Yes [provider]  esomeprazole (NEXIUM) 40 MG capsule Take 1 capsule (40 mg total) by mouth daily at 12 noon. 07/05/20  Yes Ria Bush, MD  sildenafil (REVATIO) 20 MG tablet Take 3-5 tablets (60-100 mg total) by mouth daily as needed (relations). 07/05/20  Yes Ria Bush, MD  cholestyramine Lucrezia Starch) 4 g packet Take 1 packet (4 g total) by mouth 2 (two) times daily before a meal. Patient not taking: Reported on 06/20/2021 01/02/21   Ria Bush, MD  mupirocin ointment Drue Stager) 2 % Apply to skin qd-bid Patient not taking: Reported on 06/20/2021 04/19/21  Ralene Bathe, MD  promethazine-dextromethorphan (PROMETHAZINE-DM) 6.25-15 MG/5ML syrup Take 5 mLs by mouth 3 (three) times daily as needed for cough (sedation precautions). Patient not taking: Reported on 06/20/2021 04/18/21   Ria Bush, MD     Family History  Problem Relation Age of Onset   Valvular heart disease Mother    Ulcers Father    CAD Father 21       multiple stents   CAD Paternal Uncle    Cancer Neg  Hx    Diabetes Neg Hx    Stroke Neg Hx    Colon cancer Neg Hx    Esophageal cancer Neg Hx    Pancreatic cancer Neg Hx    Liver disease Neg Hx    Stomach cancer Neg Hx     Social History   Socioeconomic History   Marital status: Married    Spouse name: Not on file   Number of children: 3   Years of education: Not on file   Highest education level: Not on file  Occupational History   Occupation: seeding and landscaping services    Employer: OTHER  Tobacco Use   Smoking status: Never   Smokeless tobacco: Former    Quit date: 01/30/2009  Vaping Use   Vaping Use: Never used  Substance and Sexual Activity   Alcohol use: No   Drug use: No   Sexual activity: Not Currently  Other Topics Concern   Not on file  Social History Narrative   Caffeine: rare   Lives with wife, 1 dog.   Grown children   Occupation: Works in Biomedical scientist.   Activity: stays active at work   Diet: daily fruits/vegetables, some water   Social Determinants of Health   Financial Resource Strain: Low Risk    Difficulty of Paying Living Expenses: Not hard at all  Food Insecurity: No Food Insecurity   Worried About Charity fundraiser in the Last Year: Never true   Arboriculturist in the Last Year: Never true  Transportation Needs: No Transportation Needs   Lack of Transportation (Medical): No   Lack of Transportation (Non-Medical): No  Physical Activity: Insufficiently Active   Days of Exercise per Week: 3 days   Minutes of Exercise per Session: 20 min  Stress: No Stress Concern Present   Feeling of Stress : Not at all  Social Connections: Socially Integrated   Frequency of Communication with Friends and Family: More than three times a week   Frequency of Social Gatherings with Friends and Family: More than three times a week   Attends Religious Services: More than 4 times per year   Active Member of [employer] or Organizations: Yes   Attends Music therapist: More than 4 times per year    Marital Status: Married    Review of Systems: A 12 point ROS discussed and pertinent positives are indicated in the HPI above.  All other systems are negative.  Review of Systems  Vital Signs: Pulse (!) 103    Ht 6' (1.829 m)    Wt 215 lb (97.5 kg)    SpO2 99%    BMI 29.16 kg/m   Physical Exam Constitutional:      Appearance: Normal appearance.  HENT:     Head: Normocephalic and atraumatic.  Cardiovascular:     Rate and Rhythm: Regular rhythm. Tachycardia present.  Pulmonary:     Effort: Pulmonary effort is normal.     Breath sounds: Wheezing present.  Abdominal:  General: Abdomen is flat.     Palpations: Abdomen is soft.  Skin:    General: Skin is warm and dry.  Neurological:     Mental Status: He is alert and oriented to person, place, and time.    Imaging: MR LIVER W WO CONTRAST  Result Date: 06/08/2021 CLINICAL DATA:  Postcholecystectomy November 2021.  Abdominal pain. EXAM: MRI ABDOMEN WITHOUT AND WITH CONTRAST TECHNIQUE: Multiplanar multisequence MR imaging of the abdomen was performed both before and after the administration of intravenous contrast. CONTRAST:  16mL GADAVIST GADOBUTROL 1 MMOL/ML IV SOLN COMPARISON:  CT 05/19/2021, 05/10/2020 FINDINGS: Lower chest:  Lung bases are clear. Hepatobiliary: Along the inferior margin of the RIGHT hepatic lobe adjacent the gallbladder fossa there are several branching irregular channels which have peripheral enhancement and central non enhancement. For example 4.2 by 2.3 cm channel on image 51/series 5. Similar channel extends along the anterior inferior margin of the liver measuring 7.2 by 2.0 on image 46/15. These enhancing channels coalesce centrally (image 47/15) in the region the gallbladder fossa. These channels are less well depicted on T2 weighted imaging as mildly hyperintense (images 19 through 21 of series 4). Lesions do demonstrate restricted diffusion (image 19/series 9). In the subcapsular RIGHT hepatic lobe laterally  there is a small similar peripheral enhancing lesion measuring 13 mm (image 25/series 15) Third lesion anterior to the subcapsular central LEFT hepatic lobe (image 28/5) appears to be a benign cysts. No biliary duct dilatation.  Common bile duct normal Pancreas: Normal pancreatic parenchymal intensity. No ductal dilatation or inflammation. Spleen: Normal spleen. Adrenals/urinary tract: Adrenal glands normal. Several benign nonenhancing cysts within the kidneys. Stomach/Bowel: Stomach and limited of the small bowel is unremarkable Vascular/Lymphatic: Abdominal aortic normal caliber. No retroperitoneal periportal lymphadenopathy. Musculoskeletal: No aggressive osseous lesion IMPRESSION: 1. Along the inferior margin RIGHT hepatic lobe, aggressive appearing peripheral enhancing collection in the gallbladder fossa with branching channels extending from central coalescence of channels. Differential would include aggressive postcholecystectomy delayed infection along the capsule of the liver versus MALIGNANCY. Recommend tissue sampling. 2. Much smaller focus focus in the posterior RIGHT hepatic lobe has similar imaging findings same process but much smaller. 3. No biliary duct dilatation.  Normal pancreas. These results will be called to the ordering clinician or representative by the Radiologist Assistant, and communication documented in the PACS or [employer]. Electronically Signed   By: Suzy Bouchard M.D.   On: 06/08/2021 14:03    Labs:  CBC: Recent Labs    06/09/21 0843 06/20/21 0943  WBC 9.4 8.3  HGB 15.1 15.9  HCT 45.1 49.6  PLT 380.0 348    COAGS: Recent Labs    06/20/21 0943  INR 1.2    BMP: Recent Labs    07/29/20 0817 05/05/21 1529 06/09/21 0843 06/20/21 0943  NA 140 137 137 136  K 4.2 4.9 4.7 4.5  CL 102 102 100 104  CO2 31 28 28 25   GLUCOSE 96 99 98 109*  BUN 17 17 13 21   CALCIUM 9.0 9.2 9.5 8.8*  CREATININE 1.03 1.02 0.96 1.04  GFRNONAA  --   --   --  >60     LIVER FUNCTION TESTS: Recent Labs    07/29/20 0817 09/09/20 1021 06/09/21 0843 06/20/21 0943  BILITOT 0.5 0.6 0.7 0.7  AST 84* 21 20 32  ALT 80* 26 19 31   ALKPHOS 122* 121* 181* 182*  PROT 7.1 6.9 7.5 7.8  ALBUMIN 3.9 4.2 3.9 3.4*    TUMOR  MARKERS: Recent Labs    06/09/21 0843  CEA <0.5  CA199 9    Assessment and Plan: 71 year old male s/p laparoscopic cholecystectomy 04/2020 for acute severe calculus cholecystitis. The patient is here today for scheduled elective GB fossa (liver) mass biopsy. Upon reviewing imaging today with my attending, Dr. Earleen Newport it is undetermined if the GB fossa area of concern is solid versus a fluid collection with possible infection/abscess. The patient did have RUQ pain and abnormal liver labs in 08/2020 and CT imaging was performed at that time without any abnormality seen. He has had a colonoscopy in 07/2019 with 1 polyp removed, pathology without evidence of malignancy, recent tumor marker labs all WNL and Cbc without leukocytosis. The patient admits to ongoing intermittent non significant RUQ pain at times and denies any fever or chills.   The patient has been NPO, no blood thinners taken, labs and vitals have been reviewed.  Risks and benefits of CT and US guided GB fossa (liver) mass biopsy versus fluid collection aspiration with possible drainage catheter placement with moderate sedation was discussed with the patient and/or patient's family including, but not limited to bleeding, infection, transient sepsis and need for admission and antibiotic therapy, damage to adjacent structures or low yield requiring additional tests.  All of the questions were answered and there is agreement to proceed.  Consent signed and in chart.   Thank you for this interesting consult.  I greatly enjoyed meeting Mark Martinez and look forward to participating in their care.  A copy of this report was sent to the requesting provider on this date.  Electronically  Signed: Hedy Jacob, PA-C 06/20/2021, 10:41 AM   I spent a total of 30 Minutes in face to face in clinical consultation, greater than 50% of which was counseling/coordinating care for gallbladder fossa (liver) mass versus fluid collection.

## 2021-06-21 ENCOUNTER — Telehealth: Payer: Self-pay | Admitting: Family Medicine

## 2021-06-21 NOTE — Telephone Encounter (Signed)
Spoke with patient - some soreness at site otherwise doing well.  Has drain in place, draining it daily. Doesn't know how much drained yesterday.

## 2021-06-25 LAB — AEROBIC/ANAEROBIC CULTURE W GRAM STAIN (SURGICAL/DEEP WOUND)
Culture: NO GROWTH
Gram Stain: NONE SEEN

## 2021-06-26 ENCOUNTER — Emergency Department
Admission: EM | Admit: 2021-06-26 | Discharge: 2021-06-26 | Disposition: A | Discharge status: Home / Self Care | Payer: Medicare HMO | Attending: Emergency Medicine | Admitting: Emergency Medicine

## 2021-06-26 ENCOUNTER — Emergency Department: Payer: Medicare HMO

## 2021-06-26 ENCOUNTER — Telehealth: Payer: Self-pay | Admitting: Family Medicine

## 2021-06-26 ENCOUNTER — Other Ambulatory Visit: Payer: Self-pay

## 2021-06-26 DIAGNOSIS — T85520A Displacement of bile duct prosthesis, initial encounter: Secondary | ICD-10-CM | POA: Diagnosis not present

## 2021-06-26 DIAGNOSIS — Y829 Unspecified medical devices associated with adverse incidents: Secondary | ICD-10-CM | POA: Insufficient documentation

## 2021-06-26 DIAGNOSIS — K750 Abscess of liver: Secondary | ICD-10-CM | POA: Diagnosis not present

## 2021-06-26 DIAGNOSIS — N200 Calculus of kidney: Secondary | ICD-10-CM | POA: Diagnosis not present

## 2021-06-26 DIAGNOSIS — T82898A Other specified complication of vascular prosthetic devices, implants and grafts, initial encounter: Secondary | ICD-10-CM | POA: Diagnosis not present

## 2021-06-26 DIAGNOSIS — T85698A Other mechanical complication of other specified internal prosthetic devices, implants and grafts, initial encounter: Secondary | ICD-10-CM

## 2021-06-26 DIAGNOSIS — R109 Unspecified abdominal pain: Secondary | ICD-10-CM | POA: Diagnosis not present

## 2021-06-26 DIAGNOSIS — R1011 Right upper quadrant pain: Secondary | ICD-10-CM | POA: Diagnosis present

## 2021-06-26 LAB — CBC WITH DIFFERENTIAL/PLATELET
Abs Immature Granulocytes: 0.04 10*3/uL (ref 0.00–0.07)
Basophils Absolute: 0 10*3/uL (ref 0.0–0.1)
Basophils Relative: 0 %
Eosinophils Absolute: 0.1 10*3/uL (ref 0.0–0.5)
Eosinophils Relative: 1 %
HCT: 48.8 % (ref 39.0–52.0)
Hemoglobin: 15.7 g/dL (ref 13.0–17.0)
Immature Granulocytes: 0 %
Lymphocytes Relative: 15 %
Lymphs Abs: 1.6 10*3/uL (ref 0.7–4.0)
MCH: 28.5 pg (ref 26.0–34.0)
MCHC: 32.2 g/dL (ref 30.0–36.0)
MCV: 88.7 fL (ref 80.0–100.0)
Monocytes Absolute: 0.8 10*3/uL (ref 0.1–1.0)
Monocytes Relative: 7 %
Neutro Abs: 8.3 10*3/uL — ABNORMAL HIGH (ref 1.7–7.7)
Neutrophils Relative %: 77 %
Platelets: 335 10*3/uL (ref 150–400)
RBC: 5.5 MIL/uL (ref 4.22–5.81)
RDW: 13.2 % (ref 11.5–15.5)
WBC: 10.8 10*3/uL — ABNORMAL HIGH (ref 4.0–10.5)
nRBC: 0 % (ref 0.0–0.2)

## 2021-06-26 LAB — COMPREHENSIVE METABOLIC PANEL
ALT: 25 U/L (ref 0–44)
AST: 24 U/L (ref 15–41)
Albumin: 3.3 g/dL — ABNORMAL LOW (ref 3.5–5.0)
Alkaline Phosphatase: 198 U/L — ABNORMAL HIGH (ref 38–126)
Anion gap: 8 (ref 5–15)
BUN: 17 mg/dL (ref 8–23)
CO2: 25 mmol/L (ref 22–32)
Calcium: 8.5 mg/dL — ABNORMAL LOW (ref 8.9–10.3)
Chloride: 99 mmol/L (ref 98–111)
Creatinine, Ser: 0.98 mg/dL (ref 0.61–1.24)
GFR, Estimated: >60 mL/min (ref >60)
Glucose, Bld: 104 mg/dL — ABNORMAL HIGH (ref 70–99)
Potassium: 4.3 mmol/L (ref 3.5–5.1)
Sodium: 132 mmol/L — ABNORMAL LOW (ref 135–145)
Total Bilirubin: 0.5 mg/dL (ref 0.3–1.2)
Total Protein: 7.6 g/dL (ref 6.5–8.1)

## 2021-06-26 LAB — LIPASE, BLOOD: Lipase: 28 U/L (ref 11–51)

## 2021-06-26 MED ORDER — OXYCODONE-ACETAMINOPHEN 5-325 MG PO TABS: 1.0000 | ORAL_TABLET | ORAL | 0 refills | Status: DC | PRN

## 2021-06-26 MED ORDER — LACTATED RINGERS IV BOLUS
1000.0000 mL | Freq: Once | INTRAVENOUS | Status: AC
Start: 2021-06-26 — End: 2021-06-26
  Administered 2021-06-26: 1000 mL via INTRAVENOUS

## 2021-06-26 MED ORDER — ONDANSETRON 4 MG PO TBDP: 4.0000 mg | ORAL_TABLET | Freq: Three times a day (TID) | ORAL | 0 refills | Status: DC | PRN

## 2021-06-26 MED ORDER — AMOXICILLIN-POT CLAVULANATE 875-125 MG PO TABS: 1.0000 | ORAL_TABLET | Freq: Two times a day (BID) | ORAL | 0 refills | Status: AC

## 2021-06-26 MED ORDER — ONDANSETRON HCL 4 MG/2ML IJ SOLN
4.0000 mg | Freq: Once | INTRAMUSCULAR | Status: AC
  Administered 2021-06-26: 4 mg via INTRAVENOUS
  Filled 2021-06-26: qty 2

## 2021-06-26 MED ORDER — MORPHINE SULFATE (PF) 4 MG/ML IV SOLN
4.0000 mg | Freq: Once | INTRAVENOUS | Status: AC
  Administered 2021-06-26: 4 mg via INTRAVENOUS
  Filled 2021-06-26: qty 1

## 2021-06-26 MED ORDER — IOHEXOL 300 MG/ML  SOLN
100.0000 mL | Freq: Once | INTRAMUSCULAR | Status: AC | PRN
  Administered 2021-06-26: 100 mL via INTRAVENOUS
  Filled 2021-06-26: qty 100

## 2021-06-26 NOTE — ED Triage Notes (Addendum)
Pt comes with c/o post op problem. Pt states he had liver biopsy last Tuesday. Pt had drain placed. Pt states over  the last few days the pain got worse in his side. Pt states it hurts to take a deep breath. Pt states he also doesn't think it is draining right.  Wife reports low grade fever at home of 99.4 Pt states it doesn't really hurt at the site mostly feels painful under his ribs and skin.

## 2021-06-26 NOTE — Telephone Encounter (Signed)
Pt called stating that he had a liver biopsy and now its hurting. Pt wanted to let Dr Darnell Level know that he was on his way to the ER.

## 2021-06-26 NOTE — Telephone Encounter (Addendum)
Abscess culture collected returned no growth final (06/25/2021).  Seen at ER with reassuring evaluation, treated with pain med and augmentin course started.  Plz call for update on symptoms, see how he's doing after ER evaluation.

## 2021-06-26 NOTE — ED Provider Notes (Signed)
[facility] Provider Note    Event Date/Time   First MD Initiated Contact with Patient 06/26/21 0957     (approximate)   History   Chief Complaint Post-op Problem   HPI  MARQ REBELLO is a 71 y.o. male with past medical history of hyperlipidemia and GERD who presents to the ED complaining of postop problem.  He has been dealing with increasing discomfort in the right upper quadrant of his abdomen for the past couple of months, there was initially concern for malignancy based off of outpatient MRI.  He had biopsy performed with interventional radiology that was more consistent with abscess and 10 French drain was placed on January 3 with 27 cc of purulent fluid drained at that time.  Plan was to wait for culture data before prescribing antibiotics at that time.  Patient states that drain was initially continually draining purulent fluid for the first few days after placement.  For the past couple of days, there has been minimal drainage and patient reports severe sharp pain in his right upper quadrant with any movement or deep breath.  He has not had any nausea or vomiting, denies any changes in his bowel movements or burning when he urinates.  He describes subjective fevers and chills but temperature has not gotten higher than 99.4 at home.  He spoke with his PCP about this and was referred to the ED for further evaluation, he has not yet seen a surgeon for this issue.     Physical Exam   Triage Vital Signs: ED Triage Vitals  Enc Vitals Group     BP 06/26/21 0929 123/87     Pulse Rate 06/26/21 0929 (!) 105     Resp 06/26/21 0929 18     Temp 06/26/21 0929 98 F (36.7 C)     Temp Source 06/26/21 0929 Oral     SpO2 06/26/21 0929 93 %     Weight 06/26/21 0948 214 lb 15.2 oz (97.5 kg)     Height 06/26/21 0948 6' (1.829 m)     Head Circumference --      Peak Flow --      Pain Score 06/26/21 0931 3     Pain Loc --      Pain Edu? --      Excl. in [place]? --      Most recent vital signs: Vitals:   06/26/21 1212 06/26/21 1336  BP: 122/87 119/89  Pulse: 83 80  Resp: 18 18  Temp:    SpO2: 94% 92%    Constitutional: Alert and oriented. Eyes: Conjunctivae are normal. Head: Atraumatic. Nose: No congestion/rhinnorhea. Mouth/Throat: Mucous membranes are moist.  Cardiovascular: Normal rate, regular rhythm. Grossly normal heart sounds.  2+ radial pulses bilaterally. Respiratory: Normal respiratory effort.  No retractions. Lungs CTAB. Gastrointestinal: Soft and tender to palpation in the right upper quadrant with no rebound or guarding.  Drain in place to right upper quadrant, sutures intact with no erythema, warmth, or drainage from site.  No drainage noted and JP bulb. No distention. Genitourinary: Deferred. Musculoskeletal: No lower extremity tenderness nor edema.  Neurologic:  Normal speech and language. No gross focal neurologic deficits are appreciated.    ED Results / Procedures / Treatments   Labs (all labs ordered are listed, but only abnormal results are displayed) Labs Reviewed  CBC WITH DIFFERENTIAL/PLATELET - Abnormal; Notable for the following components:      Result Value   WBC 10.8 (*)  Neutro Abs 8.3 (*)    All other components within normal limits  COMPREHENSIVE METABOLIC PANEL - Abnormal; Notable for the following components:   Sodium 132 (*)    Glucose, Bld 104 (*)    Calcium 8.5 (*)    Albumin 3.3 (*)    Alkaline Phosphatase 198 (*)    All other components within normal limits  LIPASE, BLOOD     RADIOLOGY CT scan reviewed by me with appropriate positioning of drain and decreased fluid collection, radiology read pending.  PROCEDURES:  Critical Care performed: No  Procedures   MEDICATIONS ORDERED IN ED: Medications  morphine 4 MG/ML injection 4 mg (4 mg Intravenous Given 06/26/21 1037)  ondansetron (ZOFRAN) injection 4 mg (4 mg Intravenous Given 06/26/21 1037)  lactated ringers bolus 1,000 mL (1,000 mLs  Intravenous New Bag/Given 06/26/21 1037)  iohexol (OMNIPAQUE) 300 MG/ML solution 100 mL (100 mLs Intravenous Contrast Given 06/26/21 1204)     IMPRESSION / MDM / ASSESSMENT AND PLAN / ED COURSE  I reviewed the triage vital signs and the nursing notes.                              71 y.o. male with past medical history of hyperlipidemia and GERD who presents to the ED with increasing right upper quadrant abdominal pain after placement of drain for apparent hepatic abscess 6 days ago.  Patient is status postcholecystectomy.  Chart reviewed from recent procedure with IR, patient had 27 cc of what was described as purulent fluid drained however culture has shown no growth.  Differential diagnosis includes, but is not limited to, worsening hepatic abscess, malignancy, hepatitis, lower lobe pneumonia, cellulitis.  Examination of drainage site is reassuring with no evidence of superficial cellulitis or abscess noted.  We will further assess with CT scan for displacement of drain versus worsening abscess or other acute process.  LFTs and lipase are pending, we will treat symptomatically with IV morphine and Zofran.  Labs are reassuring, LFTs are within normal limits, CBC and BMP show no anemia or electrolyte abnormality.  CT scan shows appropriate positioning of right upper quadrant drain with improved fluid collection, no signs of worsening infection or other acute process to explain patient's pain.  Findings reviewed with Dr. Christian Mate of general surgery, who recommends flushing drain and as long as it is functioning appropriately, then patient would be appropriate for outpatient follow-up and course of antibiotics.  Drain was flushed by nursing staff with minimal resistance, no indication for placement of drain at this time.  Will prescribe short course of pain medication along with course of Augmentin.  Patient counseled to follow-up with general surgery as well as his PCP, was counseled to return to the ED  for new worsening symptoms.  Patient and family agree with plan.       FINAL CLINICAL IMPRESSION(S) / ED DIAGNOSES   Final diagnoses:  Liver abscess  Broken Jackson-Pratt drain, initial encounter     Rx / DC Orders   ED Discharge Orders          Ordered    oxyCODONE-acetaminophen (PERCOCET) 5-325 MG tablet  Every 4 hours PRN        06/26/21 1446    amoxicillin-clavulanate (AUGMENTIN) 875-125 MG tablet  2 times daily        06/26/21 1446    ondansetron (ZOFRAN-ODT) 4 MG disintegrating tablet  Every 8 hours PRN  06/26/21 1446             Note:  This document was prepared using Dragon voice recognition software and may include unintentional dictation errors.   Blake Divine, MD 06/26/21 1451

## 2021-06-26 NOTE — ED Notes (Signed)
See triage note  presents with possible post op pain  states he had a JP drain placed last Tuesday   developed some pain/soreness under anterior rib area for the past couple of days  subjective fever yesterday  afebrile on arrival   dressing dry and intact

## 2021-06-27 NOTE — Telephone Encounter (Signed)
Spoke to patient by telephone and was advised that he has an appointment next week hopefully to get the drain tube removed. Patient stated that he had a CT done yesterday and was told that every thing was in place and working like it should. Patient stated that the area where the drain tube is hurts when he coughs, sneezes or moves certain ways.  Patient was advised to try and support the area when he has sudden movement Patient stated that he thinks that he will be alright once the tube is removed.

## 2021-06-28 ENCOUNTER — Other Ambulatory Visit: Payer: Self-pay | Admitting: Family Medicine

## 2021-07-05 ENCOUNTER — Ambulatory Visit: Payer: Medicare HMO | Admitting: Surgery

## 2021-07-05 ENCOUNTER — Other Ambulatory Visit: Payer: Self-pay

## 2021-07-05 ENCOUNTER — Encounter: Payer: Self-pay | Admitting: Surgery

## 2021-07-05 VITALS — BP 108/78 | HR 96 | Temp 98.3°F | Ht 72.5 in | Wt 207.8 lb

## 2021-07-05 DIAGNOSIS — K750 Abscess of liver: Secondary | ICD-10-CM | POA: Diagnosis not present

## 2021-07-05 NOTE — Patient Instructions (Addendum)
CT Abdomen/Pelvis scheduled 07/06/2021 @ 9:45 Outpatient Imaging.  Do not eat/drink 4 hours prior to having the scan. Please go across the street to Outpatient imaging and pick up your prep kit for the CT scan after your appointment today.  Please see your follow up appointment listed below.

## 2021-07-06 ENCOUNTER — Ambulatory Visit
Admission: RE | Admit: 2021-07-06 | Discharge: 2021-07-06 | Disposition: A | Discharge status: Home / Self Care | Payer: Medicare HMO | Source: Ambulatory Visit | Attending: Surgery | Admitting: Surgery

## 2021-07-06 DIAGNOSIS — K750 Abscess of liver: Secondary | ICD-10-CM

## 2021-07-06 DIAGNOSIS — M47816 Spondylosis without myelopathy or radiculopathy, lumbar region: Secondary | ICD-10-CM | POA: Diagnosis not present

## 2021-07-06 DIAGNOSIS — N200 Calculus of kidney: Secondary | ICD-10-CM | POA: Diagnosis not present

## 2021-07-06 MED ORDER — IOHEXOL 300 MG/ML  SOLN
100.0000 mL | Freq: Once | INTRAMUSCULAR | Status: AC | PRN
  Administered 2021-07-06: 100 mL via INTRAVENOUS

## 2021-07-06 NOTE — Progress Notes (Signed)
Outpatient Surgical Follow Up  07/06/2021  Mark Martinez is an 71 y.o. male.   CC liver abscess  HPI:  71 year old male well-known to me with a history of robotic cholecystectomy 1 year and 2 months ago.  This was uneventful and PROFine for cholecystitis. He developed  respiratory infection couple months ago.  Is prompted CT scan of the chest where they saw some inflammatory process in the gallbladder fossa.  Initially thought to be potential malignancy.  Of note the patient never complained of any abdominal pain.  He subsequently had a CT scan of the abdomen and pelvis as well as an MRI showing a collection within the gallbladder fossa.  Subsequently CT guided drain was placed revealing purulent material. Please note that I have personally reviewed all imaging studies.  He has completed antibiotic therapy. Final culture showed no evidence of any organisms Currently he is tolerating diet.  No fevers no chills no evidence of jaundice.  Past Medical History:  Diagnosis Date   Cataract    COVID-19 virus infection 06/2019   ED (erectile dysfunction)    Elevated LFTs    Esophageal stricture    GERD (gastroesophageal reflux disease)    with hiatal hernia   H/O hiatal hernia    History of prostatitis    HLD (hyperlipidemia)    Prediabetes 07/28/2015   Shortness of breath    exersion   Tubular adenoma of colon     Past Surgical History:  Procedure Laterality Date   BACK SURGERY  1994   lumbar area   CHOLECYSTECTOMY     COLONOSCOPY  2005   WNL, rpt 10 yrs   COLONOSCOPY  07/2019   1 TA, hemorrhoids, rpt 7 yrs [name])   CYST REMOVAL NECK Left 08/31/2013   Procedure: EXCISION EPIDERMAL INCLUSION CYST LEFT NECK;  Surgeon: Zenovia Jarred, MD   ESOPHAGEAL DILATION     three last   ESOPHAGOGASTRODUODENOSCOPY  12-27-1996   hiatal hernia (Dr. Sharlett Iles)   SHOULDER ARTHROSCOPY WITH ROTATOR CUFF REPAIR AND SUBACROMIAL DECOMPRESSION Right 01/2020   distal clavicle resection, RTC repair  ([facility])    Family History  Problem Relation Age of Onset   Valvular heart disease Mother    Ulcers Father    CAD Father 5       multiple stents   CAD Paternal Uncle    Cancer Neg Hx    Diabetes Neg Hx    Stroke Neg Hx    Colon cancer Neg Hx    Esophageal cancer Neg Hx    Pancreatic cancer Neg Hx    Liver disease Neg Hx    Stomach cancer Neg Hx     Social History:  reports that he has never smoked. He quit smokeless tobacco use about 12 years ago. He reports that he does not drink alcohol and does not use drugs.  Allergies:  Allergies  Allergen Reactions   Codeine Other (See Comments)    Keeps him awake    Medications reviewed.    ROS Full ROS performed and is otherwise negative other than what is stated in HPI   BP 108/78    Pulse 96    Temp 98.3 F (36.8 C) (Oral)    Ht 6' 0.5" (1.842 m)    Wt 207 lb 12.8 oz (94.3 kg)    SpO2 94%    BMI 27.80 kg/m   Physical Exam Vitals and nursing note reviewed. Exam conducted with a chaperone present.  Constitutional:  General: He is not in acute distress.    Appearance: Normal appearance. He is normal weight. He is not ill-appearing or toxic-appearing.  Eyes:     General: No scleral icterus.       Right eye: No discharge.        Left eye: No discharge.  Cardiovascular:     Rate and Rhythm: Normal rate and regular rhythm.     Heart sounds: No murmur heard. Pulmonary:     Effort: Pulmonary effort is normal. No respiratory distress.     Breath sounds: Normal breath sounds. No stridor.  Abdominal:     General: Abdomen is flat. There is no distension.     Palpations: Abdomen is soft. There is no mass.     Tenderness: There is no abdominal tenderness. There is no guarding or rebound.     Hernia: No hernia is present.     Comments: JP in place with some cloudy fluid but no evidence of bile with no evidence of pus  Musculoskeletal:        General: No swelling or tenderness. Normal range of motion.     Cervical back:  Normal range of motion and neck supple. No rigidity.  Skin:    General: Skin is warm and dry.     Capillary Refill: Capillary refill takes less than 2 seconds.  Neurological:     General: No focal deficit present.     Mental Status: He is alert and oriented to person, place, and time.  Psychiatric:        Mood and Affect: Mood normal.        Behavior: Behavior normal.        Thought Content: Thought content normal.        Judgment: Judgment normal.      Assessment/Plan: 71 year old male with a liver abscess he had a cholecystectomy more than a year ago.  Puzzling regarding etiology he may have had some infectious colon or small bowel pathology that may have seeded the liver.  He is improved significantly after drain placement and antibiotic therapy.  We will obtain CT scan before removing the drain.  He is not back toxic he is not peritonitic and does not need to be hospitalized or have an immediate surgical intervention. Extensive counseling provided.  Please note that I have spent greater than 40 minutes in this encounter including personally reviewing multiple imaging studies, placing orders, coordinating his care and performing appropriate documentation  Caroleen Hamman, MD [facility]

## 2021-07-07 ENCOUNTER — Telehealth (INDEPENDENT_AMBULATORY_CARE_PROVIDER_SITE_OTHER): Payer: Medicare HMO | Admitting: Family Medicine

## 2021-07-07 ENCOUNTER — Other Ambulatory Visit: Payer: Self-pay

## 2021-07-07 ENCOUNTER — Encounter: Payer: Medicare HMO | Admitting: Surgery

## 2021-07-07 ENCOUNTER — Encounter: Payer: Self-pay | Admitting: Family Medicine

## 2021-07-07 VITALS — Temp 99.2°F | Ht 72.0 in

## 2021-07-07 DIAGNOSIS — U071 COVID-19: Secondary | ICD-10-CM | POA: Diagnosis not present

## 2021-07-07 MED ORDER — METRONIDAZOLE 500 MG PO TABS: 500.0000 mg | ORAL_TABLET | Freq: Three times a day (TID) | ORAL | 0 refills | Status: AC

## 2021-07-07 MED ORDER — NIRMATRELVIR/RITONAVIR (PAXLOVID)TABLET: 3.0000 | ORAL_TABLET | Freq: Two times a day (BID) | ORAL | 0 refills | Status: AC

## 2021-07-07 MED ORDER — CIPROFLOXACIN HCL 500 MG PO TABS: 500.0000 mg | ORAL_TABLET | Freq: Two times a day (BID) | ORAL | 0 refills | Status: DC

## 2021-07-07 MED ORDER — BENZONATATE 200 MG PO CAPS: 200.0000 mg | ORAL_CAPSULE | Freq: Two times a day (BID) | ORAL | 0 refills | Status: DC | PRN

## 2021-07-07 NOTE — Assessment & Plan Note (Signed)
COVID19  Infection < 5 days from onset of symptoms in 2 x vaccinated overweight individual.    No clear sign of bacterial infection at this time except on antibiotics for possible liver abscess.   No SOB.  No red flags/need for ER visit or in-person exam at respiratory clinic at this time..     Pt higher risk for COVID complications given  age. GFR  >60 and no medication contraindications.  Start paxlovid 5 day course. Reviewed course of medication and side effect profile with patient in detail.   Symptomatic care with mucinex and cough suppressant at night. If SOB begins symptoms worsening.. have low threshold for in-person exam, if severe shortness of breath ER visit recommended.  Can monitor Oxygen saturation at home with home monitor if able to obtain.  Go to ER if O2 sat < 90% on room air.   Reviewed home care and provided information through [facility].  Recommended quarantine 5 days isolation recommended. Return to work day 6 and wear mask for 4 more days to complete 10 days. Provided info about prevention of spread of COVID 19.

## 2021-07-07 NOTE — Progress Notes (Signed)
VIRTUAL VISIT Due to national recommendations of social distancing due to [place] 19, a virtual visit is felt to be most appropriate for this patient at this time.   I connected with the patient on 07/07/21 at 12:00 PM EST by virtual telehealth platform and verified that I am speaking with the correct person using two identifiers.   I discussed the limitations, risks, security and privacy concerns of performing an evaluation and management service by  virtual telehealth platform and the availability of in person appointments. I also discussed with the patient that there may be a patient responsible charge related to this service. The patient expressed understanding and agreed to proceed.  Patient location: Home Provider Location: [facility] Participants: Eliezer Lofts and [name]   Chief Complaint  Patient presents with   Covid Positive    Positive home test this morning Symptoms started on Wednesday   Cough   Nasal Congestion   Headache    History of Present Illness: 71 year old male patient of Dr. Danise Mina with history of oral tobacco use remotely who presents with new COVID infection.   Date of Onset: 1/18   He reports symptoms started with  new onset scratchy throat, coughing, sneeze, headache, runny nose.  This AM progressed to fever 99.50F.  No body aches, some fatigue.  Mild SOB, mild  wheeze.    Cough is keep him up at night.  Using  OTC cough syrup[ and tylenol, mucinex.  Fluid intake good.    He is currently being treated for internal injection, liver abscess.. has drain tube in place.  He has completed antibiotics this week.  Dr. Perrin Maltese started him on a second course of antibiotics given had reschedule the drainage. Abdominal area is sore but it is not worrsening.  COVID 19 screen COVID testing: 1/20 positive COVID vaccine: 10/17/2019 , 09/17/2019 COVID exposure: No recent travel or known exposure to [place]  The importance of social distancing was  discussed today.    Review of Systems  Constitutional:  Positive for chills. Negative for fever.  HENT:  Positive for congestion and sore throat. Negative for ear pain.   Eyes:  Negative for pain and redness.  Respiratory:  Positive for cough, shortness of breath and wheezing.   Cardiovascular:  Negative for chest pain, palpitations and leg swelling.  Gastrointestinal:  Negative for abdominal pain, blood in stool, constipation, diarrhea, nausea and vomiting.  Genitourinary:  Negative for dysuria.  Musculoskeletal:  Negative for falls and myalgias.  Skin:  Negative for rash.  Neurological:  Negative for dizziness.  Psychiatric/Behavioral:  Negative for depression. The patient is not nervous/anxious.      Past Medical History:  Diagnosis Date   Cataract    COVID-19 virus infection 06/2019   ED (erectile dysfunction)    Elevated LFTs    Esophageal stricture    GERD (gastroesophageal reflux disease)    with hiatal hernia   H/O hiatal hernia    History of prostatitis    HLD (hyperlipidemia)    Prediabetes 07/28/2015   Shortness of breath    exersion   Tubular adenoma of colon     reports that he has never smoked. He quit smokeless tobacco use about 12 years ago. He reports that he does not drink alcohol and does not use drugs.   Current Outpatient Medications:    acetaminophen (TYLENOL) 325 MG tablet, Take 650 mg by mouth every 6 (six) hours as needed for moderate pain. , Disp: , Rfl:  albuterol (VENTOLIN HFA) 108 (90 Base) MCG/ACT inhaler, Inhale into the lungs., Disp: , Rfl:    cholestyramine (QUESTRAN) 4 g packet, TAKE 1 PACKET (4 G TOTAL) BY MOUTH 2 (TWO) TIMES DAILY BEFORE A MEAL., Disp: 60 packet, Rfl: 0   ciprofloxacin (CIPRO) 500 MG tablet, Take 500 mg by mouth 2 (two) times daily., Disp: , Rfl:    esomeprazole (NEXIUM) 40 MG capsule, Take 1 capsule (40 mg total) by mouth daily at 12 noon., Disp: 90 capsule, Rfl: 3   metroNIDAZOLE (FLAGYL) 500 MG tablet, Take 1 tablet (500  mg total) by mouth 3 (three) times daily for 10 days., Disp: 30 tablet, Rfl: 0   sildenafil (REVATIO) 20 MG tablet, Take 3-5 tablets (60-100 mg total) by mouth daily as needed (relations)., Disp: 60 tablet, Rfl: 6   Observations/Objective: Temperature 99.2 F (37.3 C), temperature source Oral, height 6' (1.829 m).  Physical Exam  Physical Exam Constitutional:      General: The patient is not in acute distress. Pulmonary:     Effort: Pulmonary effort is normal. No respiratory distress.  Neurological:     Mental Status: The patient is alert and oriented to person, place, and time.  Psychiatric:        Mood and Affect: Mood normal.        Behavior: Behavior normal.   Assessment and Plan Problem List Items Addressed This Visit     COVID-19 virus infection - Primary    COVID19  Infection < 5 days from onset of symptoms in 2 x vaccinated overweight individual.    No clear sign of bacterial infection at this time except on antibiotics for possible liver abscess.   No SOB.  No red flags/need for ER visit or in-person exam at respiratory clinic at this time..     Pt higher risk for COVID complications given  age. GFR  >60 and no medication contraindications.  Start paxlovid 5 day course. Reviewed course of medication and side effect profile with patient in detail.   Symptomatic care with mucinex and cough suppressant at night. If SOB begins symptoms worsening.. have low threshold for in-person exam, if severe shortness of breath ER visit recommended.  Can monitor Oxygen saturation at home with home monitor if able to obtain.  Go to ER if O2 sat < 90% on room air.   Reviewed home care and provided information through [facility].  Recommended quarantine 5 days isolation recommended. Return to work day 6 and wear mask for 4 more days to complete 10 days. Provided info about prevention of spread of COVID 19.       Relevant Medications   nirmatrelvir/ritonavir EUA (PAXLOVID) 20 x 150 MG  & 10 x 100MG  TABS   Meds ordered this encounter  Medications   nirmatrelvir/ritonavir EUA (PAXLOVID) 20 x 150 MG & 10 x 100MG  TABS    Sig: Take 3 tablets by mouth 2 (two) times daily for 5 days. Patient GFR is >60. Take nirmatrelvir (150 mg) 3 tablet(s) twice daily for 5 days and ritonavir (100 mg) one tablet twice daily for 5 days.    Dispense:  30 tablet    Refill:  0   benzonatate (TESSALON) 200 MG capsule    Sig: Take 1 capsule (200 mg total) by mouth 2 (two) times daily as needed for cough.    Dispense:  20 capsule    Refill:  0      I discussed the assessment and treatment plan with the patient. The  patient was provided an opportunity to ask questions and all were answered. The patient agreed with the plan and demonstrated an understanding of the instructions.   The patient was advised to call back or seek an in-person evaluation if the symptoms worsen or if the condition fails to improve as anticipated.     Eliezer Lofts, MD

## 2021-07-10 ENCOUNTER — Ambulatory Visit: Payer: Medicare HMO | Admitting: Family Medicine

## 2021-07-10 ENCOUNTER — Encounter: Payer: Medicare HMO | Admitting: Surgery

## 2021-07-17 ENCOUNTER — Other Ambulatory Visit: Payer: Self-pay

## 2021-07-17 ENCOUNTER — Encounter: Payer: Self-pay | Admitting: Surgery

## 2021-07-17 ENCOUNTER — Ambulatory Visit (INDEPENDENT_AMBULATORY_CARE_PROVIDER_SITE_OTHER): Payer: Medicare HMO | Admitting: Surgery

## 2021-07-17 VITALS — BP 119/79 | HR 91 | Temp 98.0°F | Ht 72.5 in | Wt 206.6 lb

## 2021-07-17 DIAGNOSIS — K750 Abscess of liver: Secondary | ICD-10-CM | POA: Diagnosis not present

## 2021-07-17 NOTE — Patient Instructions (Signed)
If you have any concerns or questions, please feel free to call our office. See follow up appointment below.   Liver Abscess A liver abscess is an infected area inside the liver that contains a collection of pus. The liver is a large organ in the upper right side of the abdomen. It has many important functions, including storing energy, producing fluids that the body needs, and removing harmful substances from the bloodstream. A liver abscess can be dangerous if not treated. What are the causes? This condition may be caused by: A bacterial infection. An infection with a parasite called an amoeba (Entamoeba histolytica). An infection with a fungus called Candida. This is rare. A liver abscess can occur: When infections spread to the liver from other parts of the abdomen, such as the appendix (appendicitis), the large intestine (diverticulitis), or the gallbladder (cholecystitis). When infections spread to the liver from other parts of the body through the bloodstream. After abdominal surgery or a penetrating injury to the abdomen. What increases the risk? You are more likely to develop this condition if you: Have diabetes. Have a liver disease (cirrhosis) in which healthy liver tissue is replaced by scar tissue. Have a weakened disease-fighting system (immune system). Take a specific type of medicine to reduce stomach acid (proton pump inhibitor). Are older. Are male. What are the signs or symptoms? Symptoms of this condition often come on slowly over many days or a few weeks. The most common symptoms are: Fever. Pain in the upper right side of the abdomen. Other symptoms include: A general ill feeling (malaise). Chills. Nausea and vomiting. Loss of appetite. Diarrhea. Weight loss. How is this diagnosed? This condition may be diagnosed based on: Your symptoms, your medical history, and a physical exam. Blood tests to check for infections. Imaging tests, such as a CT scan or  ultrasound. Imaging tests are often the best way to find an abscess. Needle aspiration. In this procedure, a long needle is put through the skin and into the liver. A sample of pus is drained out through the needle. The sample is studied under a microscope, and a culture test is done. How is this treated? Treatment for this condition depends on the cause of the infection. If the abscess was caused by bacteria, treatment usually involves having the pus drained and taking antibiotic medicines. You may need to be hospitalized and given antibiotics directly into a vein through an IV. The pus may be drained from the abscess through needle aspiration or by making an incision in the abscess. If the abscess was caused by an amoeba or a fungus, drainage of the abscess is usually not needed. Antimicrobial medicines are normally used to treat those infections. Follow these instructions at home: Medicines Take over-the-counter and prescription medicines only as told by your health care provider. If you were prescribed an antibiotic medicine, take it as told by your health care provider. Do not stop using the antibiotic even if you start to feel better. General instructions  Rest as much as possible and get plenty of sleep. Avoid activities that take a lot of effort. Return to your normal activities as told by your health care provider. Ask your health care provider what activities are safe for you. Do not lift anything that is heavier than 10 lb (4.5 kg), or the limit that you are told, until your health care provider says that it is safe. Do not drink alcohol until your health care provider approves. Keep all follow-up visits. This is  important. Contact a health care provider if: Your symptoms get worse, or you develop any of the following symptoms: Pain or swelling in your abdomen. Loss of appetite. Nausea or vomiting. Diarrhea. Fever, chills, or sweats. Your skin or the white parts of your eyes  turn yellow (jaundice). Get help right away if: Your abdominal pain suddenly gets worse. You develop confusion. These symptoms may represent a serious problem that is an emergency. Do not wait to see if the symptoms will go away. Get medical help right away. Call your local emergency services (911 in the [place] Do not drive yourself to the hospital. Summary A liver abscess is an infected area inside the liver that contains a collection of pus. Liver abscesses often cause abdominal pain, fever, and other symptoms. A liver abscess can be dangerous if not treated. If the abscess was caused by bacteria, treatment usually involves having the pus drained and taking antibiotic medicines. If the abscess was caused by an amoeba or a fungus, drainage of the abscess is usually not needed. Antimicrobial medicines are normally used to treat those infections. Get help right away if your abdominal pain suddenly gets worse or you develop confusion. This information is not intended to replace advice given to you by your health care provider. Make sure you discuss any questions you have with your health care provider. Document Revised: 03/30/2020 Document Reviewed: 03/30/2020 Elsevier Patient Education  2022 [employer].

## 2021-07-18 NOTE — Progress Notes (Signed)
Outpatient Surgical Follow Up  07/18/2021  Mark Martinez is an 71 y.o. male.   Chief Complaint  Patient presents with   Routine Post Op    Lap choley 05/11/2020 and liver drain    HPI: 71 year old male well-known to me with a history of robotic cholecystectomy 1 year and 2 months ago.  This was uneventful, He developed  respiratory infection couple months ago.  ThIs prompted CT scan of the chest where they saw some inflammatory process in the gallbladder fossa.  Initially thought to be potential malignancy.  Of note the patient never complained of any abdominal pain.  He subsequently had a CT scan of the abdomen and pelvis as well as an MRI showing a collection within the gallbladder fossa.  Subsequently CT guided drain was placed revealing purulent material. Please note that I have personally reviewed all imaging studies.  He has completed antibiotic therapy.  Final culture showed no evidence of any organisms Currently he is tolerating diet.  No fevers no chills no evidence of jaundice. He did contract COVID recently.  Repeated CT scan personally reviewed showing good drain placement and there is a very small fluid collection of unknown clinical significance.  He endorses that there is less than 2 cc a day of output from the drain.  He denies any fevers any chills he is recovered from recent COVID infection I given him another round of antibiotics but he only took Flagyl.  Apparently he did not feel well when taking the quinolone.   Past Medical History:  Diagnosis Date   Cataract    COVID-19 virus infection 06/2019   ED (erectile dysfunction)    Elevated LFTs    Esophageal stricture    GERD (gastroesophageal reflux disease)    with hiatal hernia   H/O hiatal hernia    History of prostatitis    HLD (hyperlipidemia)    Prediabetes 07/28/2015   Shortness of breath    exersion   Tubular adenoma of colon     Past Surgical History:  Procedure Laterality Date   BACK SURGERY  1994    lumbar area   CHOLECYSTECTOMY     COLONOSCOPY  2005   WNL, rpt 10 yrs   COLONOSCOPY  07/2019   1 TA, hemorrhoids, rpt 7 yrs [name])   CYST REMOVAL NECK Left 08/31/2013   Procedure: EXCISION EPIDERMAL INCLUSION CYST LEFT NECK;  Surgeon: Zenovia Jarred, MD   ESOPHAGEAL DILATION     three last   ESOPHAGOGASTRODUODENOSCOPY  12-27-1996   hiatal hernia (Dr. Sharlett Iles)   SHOULDER ARTHROSCOPY WITH ROTATOR CUFF REPAIR AND SUBACROMIAL DECOMPRESSION Right 01/2020   distal clavicle resection, RTC repair ([facility])    Family History  Problem Relation Age of Onset   Valvular heart disease Mother    Ulcers Father    CAD Father 73       multiple stents   CAD Paternal Uncle    Cancer Neg Hx    Diabetes Neg Hx    Stroke Neg Hx    Colon cancer Neg Hx    Esophageal cancer Neg Hx    Pancreatic cancer Neg Hx    Liver disease Neg Hx    Stomach cancer Neg Hx     Social History:  reports that he has never smoked. He quit smokeless tobacco use about 12 years ago. He reports that he does not drink alcohol and does not use drugs.  Allergies:  Allergies  Allergen Reactions   Codeine Other (See Comments)  Keeps him awake    Medications reviewed.    ROS Full ROS performed and is otherwise negative other than what is stated in HPI   BP 119/79    Pulse 91    Temp 98 F (36.7 C) (Oral)    Ht 6' 0.5" (1.842 m)    Wt 206 lb 9.6 oz (93.7 kg)    SpO2 96%    BMI 27.63 kg/m   Physical Exam Vitals and nursing note reviewed. Exam conducted with a chaperone present.  Constitutional:      General: He is not in acute distress.    Appearance: Normal appearance. He is normal weight.  Cardiovascular:     Rate and Rhythm: Normal rate and regular rhythm.  Pulmonary:     Effort: Pulmonary effort is normal.  Abdominal:     General: Abdomen is flat. There is no distension.     Palpations: Abdomen is soft. There is no mass.     Tenderness: There is no abdominal tenderness.     Hernia: No hernia is  present.     Comments: Drain in place removed  Musculoskeletal:     Cervical back: Normal range of motion and neck supple. No rigidity or tenderness.  Skin:    General: Skin is warm.     Capillary Refill: Capillary refill takes less than 2 seconds.  Neurological:     General: No focal deficit present.     Mental Status: He is alert and oriented to person, place, and time.  Psychiatric:        Mood and Affect: Mood normal.        Behavior: Behavior normal.        Thought Content: Thought content normal.        Judgment: Judgment normal.        Assessment/Plan: Resolved liver abscess more than a year after cholecystectomy.  Suspect that he developed a prior intra-abdominal infection.  Drain removed.  I will see him back in a few weeks and at that time we will determine whether further imaging studies are required.  No need for surgical intervention at this time. Please note that I spent 40 minutes encounter including personally reviewing imaging studies, coordinating his care, counseling the patient, placing orders and performing appropriate documentation   Caroleen Hamman, MD [facility]

## 2021-07-19 ENCOUNTER — Ambulatory Visit (INDEPENDENT_AMBULATORY_CARE_PROVIDER_SITE_OTHER): Payer: Medicare HMO | Admitting: Family Medicine

## 2021-07-19 ENCOUNTER — Encounter: Payer: Self-pay | Admitting: Family Medicine

## 2021-07-19 ENCOUNTER — Other Ambulatory Visit: Payer: Self-pay

## 2021-07-19 VITALS — BP 122/84 | HR 79 | Temp 97.5°F | Ht 72.0 in | Wt 206.3 lb

## 2021-07-19 DIAGNOSIS — Z0000 Encounter for general adult medical examination without abnormal findings: Secondary | ICD-10-CM | POA: Diagnosis not present

## 2021-07-19 DIAGNOSIS — R7303 Prediabetes: Secondary | ICD-10-CM | POA: Diagnosis not present

## 2021-07-19 DIAGNOSIS — K750 Abscess of liver: Secondary | ICD-10-CM | POA: Diagnosis not present

## 2021-07-19 DIAGNOSIS — K2100 Gastro-esophageal reflux disease with esophagitis, without bleeding: Secondary | ICD-10-CM | POA: Diagnosis not present

## 2021-07-19 DIAGNOSIS — Z9049 Acquired absence of other specified parts of digestive tract: Secondary | ICD-10-CM | POA: Diagnosis not present

## 2021-07-19 DIAGNOSIS — K529 Noninfective gastroenteritis and colitis, unspecified: Secondary | ICD-10-CM

## 2021-07-19 DIAGNOSIS — E785 Hyperlipidemia, unspecified: Secondary | ICD-10-CM | POA: Diagnosis not present

## 2021-07-19 DIAGNOSIS — R053 Chronic cough: Secondary | ICD-10-CM

## 2021-07-19 DIAGNOSIS — Z7189 Other specified counseling: Secondary | ICD-10-CM

## 2021-07-19 DIAGNOSIS — Z23 Encounter for immunization: Secondary | ICD-10-CM | POA: Diagnosis not present

## 2021-07-19 MED ORDER — ESOMEPRAZOLE MAGNESIUM 40 MG PO CPDR: 40.0000 mg | DELAYED_RELEASE_CAPSULE | Freq: Every day | ORAL | 3 refills | Status: DC

## 2021-07-19 MED ORDER — CHOLESTYRAMINE 4 G PO PACK: 4.0000 g | PACK | Freq: Two times a day (BID) | ORAL | 3 refills | Status: DC

## 2021-07-19 NOTE — Patient Instructions (Addendum)
Flu shot today  [id number] today.  If interested, check with pharmacy about new 2 shot shingles series (shingrix).  Bring [place] a copy of your advanced directive  Good to see you today. Return as needed or in 1 year for next physical.  Return in 3-4 months for labs and spirometry.   Health Maintenance After Age 70 After age 19, you are at a higher risk for certain long-term diseases and infections as well as injuries from falls. Falls are a major cause of broken bones and head injuries in people who are older than age 31. Getting regular preventive care can help to keep you healthy and well. Preventive care includes getting regular testing and making lifestyle changes as recommended by your health care provider. Talk with your health care provider about: Which screenings and tests you should have. A screening is a test that checks for a disease when you have no symptoms. A diet and exercise plan that is right for you. What should I know about screenings and tests to prevent falls? Screening and testing are the best ways to find a health problem early. Early diagnosis and treatment give you the best chance of managing medical conditions that are common after age 10. Certain conditions and lifestyle choices may make you more likely to have a fall. Your health care provider may recommend: Regular vision checks. Poor vision and conditions such as cataracts can make you more likely to have a fall. If you wear glasses, make sure to get your prescription updated if your vision changes. Medicine review. Work with your health care provider to regularly review all of the medicines you are taking, including over-the-counter medicines. Ask your health care provider about any side effects that may make you more likely to have a fall. Tell your health care provider if any medicines that you take make you feel dizzy or sleepy. Strength and balance checks. Your health care provider may recommend certain tests to check  your strength and balance while standing, walking, or changing positions. Foot health exam. Foot pain and numbness, as well as not wearing proper footwear, can make you more likely to have a fall. Screenings, including: Osteoporosis screening. Osteoporosis is a condition that causes the bones to get weaker and break more easily. Blood pressure screening. Blood pressure changes and medicines to control blood pressure can make you feel dizzy. Depression screening. You may be more likely to have a fall if you have a fear of falling, feel depressed, or feel unable to do activities that you used to do. Alcohol use screening. Using too much alcohol can affect your balance and may make you more likely to have a fall. Follow these instructions at home: Lifestyle Do not drink alcohol if: Your health care provider tells you not to drink. If you drink alcohol: Limit how much you have to: 0-1 drink a day for women. 0-2 drinks a day for men. Know how much alcohol is in your drink. In the [place] one drink equals one 12 oz bottle of beer (355 mL), one 5 oz glass of wine (148 mL), or one 1 oz glass of hard liquor (44 mL). Do not use any products that contain nicotine or tobacco. These products include cigarettes, chewing tobacco, and vaping devices, such as e-cigarettes. If you need help quitting, ask your health care provider. Activity  Follow a regular exercise program to stay fit. This will help you maintain your balance. Ask your health care provider what types of exercise are appropriate  for you. If you need a cane or walker, use it as recommended by your health care provider. Wear supportive shoes that have nonskid soles. Safety  Remove any tripping hazards, such as rugs, cords, and clutter. Install safety equipment such as grab bars in bathrooms and safety rails on stairs. Keep rooms and walkways well-lit. General instructions Talk with your health care provider about your risks for falling. Tell  your health care provider if: You fall. Be sure to tell your health care provider about all falls, even ones that seem minor. You feel dizzy, tiredness (fatigue), or off-balance. Take over-the-counter and prescription medicines only as told by your health care provider. These include supplements. Eat a healthy diet and maintain a healthy weight. A healthy diet includes low-fat dairy products, low-fat (lean) meats, and fiber from whole grains, beans, and lots of fruits and vegetables. Stay current with your vaccines. Schedule regular health, dental, and eye exams. Summary Having a healthy lifestyle and getting preventive care can help to protect your health and wellness after age 6. Screening and testing are the best way to find a health problem early and help you avoid having a fall. Early diagnosis and treatment give you the best chance for managing medical conditions that are more common for people who are older than age 36. Falls are a major cause of broken bones and head injuries in people who are older than age 27. Take precautions to prevent a fall at home. Work with your health care provider to learn what changes you can make to improve your health and wellness and to prevent falls. This information is not intended to replace advice given to you by your health care provider. Make sure you discuss any questions you have with your health care provider. Document Revised: 10/24/2020 Document Reviewed: 10/24/2020 Elsevier Patient Education  [place].

## 2021-07-19 NOTE — Assessment & Plan Note (Signed)
Chronic, stable period on cholestyramine. The 10-year ASCVD risk score (Arnett DK, et al., 2019) is: 13%   Values used to calculate the score:     Age: 71 years     Sex: Male     Is Non-Hispanic African American: No     Diabetic: No     Tobacco smoker: No     Systolic Blood Pressure: 017 mmHg     Is BP treated: No     HDL Cholesterol: 49.7 mg/dL     Total Cholesterol: 140 mg/dL

## 2021-07-19 NOTE — Assessment & Plan Note (Signed)
Reviewed increasing A1c, encouraged limiting added sugars, simple carbs and sweetened beverages in diet.

## 2021-07-19 NOTE — Assessment & Plan Note (Signed)
Preventative protocols reviewed and updated unless pt declined. Discussed healthy diet and lifestyle.  

## 2021-07-19 NOTE — Assessment & Plan Note (Signed)
Advanced directive discussion - has this at home. Wife would be HCPOA. Asked to bring [place] a copy.

## 2021-07-19 NOTE — Assessment & Plan Note (Signed)
Notes recurrent bronchitis each year with lingering cough for months.  CT chest from late last year showed biapical scarring.  No smoking history.  Will return for spirometry in a few months - want to given lungs more time to heal from recent COVID infection.

## 2021-07-19 NOTE — Assessment & Plan Note (Signed)
Chronic stable on esomeprazole 40mg  daily - refilled.

## 2021-07-19 NOTE — Assessment & Plan Note (Signed)
Significant improvement since starting cholestyramine - will continue.

## 2021-07-19 NOTE — Progress Notes (Signed)
Patient ID: [name], male    DOB: Dec 16, 1950, 71 y.o.   MRN: [id number]  This visit was conducted in person.  BP 122/84    Pulse 79    Temp (!) 97.5 F (36.4 C) (Temporal)    Ht 6' (1.829 m)    Wt 206 lb 5 oz (93.6 kg)    SpO2 95%    BMI 27.98 kg/m    CC: CPE Subjective:   HPI: KAYVON MO is a 71 y.o. male presenting on 07/19/2021 for Annual Exam [facility] prt 2.  Accompanied by wife, Enid Derry.)   Here with wife Enid Derry today.  Saw health advisor 06/16/2021 for medicare wellness visit. Note reviewed.    No results found.  Flowsheet Row Clinical Support from 06/16/2021 in [place] at [place]  PHQ-2 Total Score 0       Fall Risk  07/05/2021 07/05/2021 06/16/2021 06/15/2020 05/18/2020  Falls in the past year? 0 0 0 0 0  Number falls in past yr: - - 0 0 -  Injury with Fall? - - 0 0 -  Risk for fall due to : - - No Fall Risks No Fall Risks -  Follow up - - Falls prevention discussed Falls evaluation completed;Falls prevention discussed -   Recently incidentally discovered gallbladder fossa liver abscess/infection treated with JP drain and cipro/flagyl - stopped cipro early due to lightheadedness while on it. He also had augmentin 7d course from ER 06/26/2021. He had robotic cholecystectomy 04/2020. Saw surgery in follow up earlier this week - infection seems fully resolved, has 1 more planned f/u with gen surgery later this month.  Culture returned negative for organisms.   In interim developed COVID infection 07/07/2021 s/p treatment with Paxlovid.  Per wife, notes ongoing chronic cough for the past 4 months.  ?apical lung scarring noted incidentally on contrasted chest CT 04/2021.  Over last few years notes he tends to get respiratory infections that turn into bronchitis then cough lingers for months. No signifciant wheezing, dyspnea, or exertional dyspnea curently.  Non smoker. No known asbestos exposure. [place] travel, no cave diving.  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   Preventative: COLONOSCOPY 07/2019 - 1 TA, hemorrhoids, rpt 7 yrs [name]) Prostate - all normal checks in the past. Would like PSA but declines DRE.  AAA screen - not eligible  Lung cancer screen - not eligible  Flu shot yearly [place] 09/2019, 10/2019, will double check if he had booster  Tdap 2014, 05/2019 Pneumovax 04/2019, prevnar-20 today  Shingrix - discussed,  Advanced directive discussion - has this at home. Wife would be HCPOA. Asked to bring [place] a copy.  Seatbelt use discussed.  Sunscreen use discussed. No changing moles on skin. Saw derm - good report  Non smoker  Alcohol - none Dentist q6 mo  Eye exam yearly Bowel - no constipation - diarrhea controled with cholestyramine Bladder - no incontinence    Caffeine: rare   Lives with wife, 1 dog.   Grown children   Occupation: Works in Biomedical scientist  Activity: stays active at work also has total gym at home Diet: daily fruits/vegetables, some water, decreased sweets      Relevant past medical, surgical, family and social history reviewed and updated as indicated. Interim medical history since our last visit reviewed. Allergies and medications reviewed and updated. Outpatient Medications Prior to Visit  Medication Sig Dispense Refill   acetaminophen (TYLENOL) 325 MG tablet Take 650 mg by  mouth every 6 (six) hours as needed for moderate pain.      albuterol (VENTOLIN HFA) 108 (90 Base) MCG/ACT inhaler Inhale into the lungs.     benzonatate (TESSALON) 200 MG capsule Take 1 capsule (200 mg total) by mouth 2 (two) times daily as needed for cough. 20 capsule 0   sildenafil (REVATIO) 20 MG tablet Take 3-5 tablets (60-100 mg total) by mouth daily as needed (relations). 60 tablet 6   cholestyramine (QUESTRAN) 4 g packet TAKE 1 PACKET (4 G TOTAL) BY MOUTH 2 (TWO) TIMES DAILY BEFORE A MEAL. 60 packet 0   esomeprazole (NEXIUM) 40 MG capsule Take 1 capsule (40 mg total) by mouth daily at  12 noon. 90 capsule 3   ciprofloxacin (CIPRO) 500 MG tablet Take 500 mg by mouth 2 (two) times daily.     No facility-administered medications prior to visit.     Per HPI unless specifically indicated in ROS section below Review of Systems  Constitutional:  Negative for activity change, appetite change, chills, fatigue, fever and unexpected weight change.  HENT:  Negative for hearing loss.   Eyes:  Negative for visual disturbance.  Respiratory:  Positive for cough (chronic). Negative for chest tightness, shortness of breath and wheezing.   Cardiovascular:  Negative for chest pain, palpitations and leg swelling.  Gastrointestinal:  Negative for abdominal distention, abdominal pain, blood in stool, constipation, diarrhea, nausea and vomiting.  Genitourinary:  Negative for difficulty urinating and hematuria.  Musculoskeletal:  Negative for arthralgias, myalgias and neck pain.  Skin:  Negative for rash.  Neurological:  Negative for dizziness, seizures, syncope and headaches.  Hematological:  Negative for adenopathy. Does not bruise/bleed easily.  Psychiatric/Behavioral:  Negative for dysphoric mood. The patient is not nervous/anxious.    Objective:  BP 122/84    Pulse 79    Temp (!) 97.5 F (36.4 C) (Temporal)    Ht 6' (1.829 m)    Wt 206 lb 5 oz (93.6 kg)    SpO2 95%    BMI 27.98 kg/m   Wt Readings from Last 3 Encounters:  07/19/21 206 lb 5 oz (93.6 kg)  07/17/21 206 lb 9.6 oz (93.7 kg)  07/05/21 207 lb 12.8 oz (94.3 kg)      Physical Exam Vitals and nursing note reviewed.  Constitutional:      General: He is not in acute distress.    Appearance: Normal appearance. He is well-developed. He is not ill-appearing.  HENT:     Head: Normocephalic and atraumatic.     Right Ear: Hearing, tympanic membrane, ear canal and external ear normal.     Left Ear: Hearing, tympanic membrane, ear canal and external ear normal.  Eyes:     General: No scleral icterus.    Extraocular Movements:  Extraocular movements intact.     Conjunctiva/sclera: Conjunctivae normal.     Pupils: Pupils are equal, round, and reactive to light.  Neck:     Thyroid: No thyroid mass or thyromegaly.     Vascular: No carotid bruit.  Cardiovascular:     Rate and Rhythm: Normal rate and regular rhythm.     Pulses: Normal pulses.          Radial pulses are 2+ on the right side and 2+ on the left side.     Heart sounds: Normal heart sounds. No murmur heard. Pulmonary:     Effort: Pulmonary effort is normal. No respiratory distress.     Breath sounds: Normal breath sounds. No wheezing,  rhonchi or rales.     Comments: Lungs clear Abdominal:     General: Bowel sounds are normal. There is no distension.     Palpations: Abdomen is soft. There is no mass.     Tenderness: There is no abdominal tenderness. There is no guarding or rebound.     Hernia: No hernia is present.  Musculoskeletal:        General: Normal range of motion.     Cervical back: Normal range of motion and neck supple.     Right lower leg: No edema.     Left lower leg: No edema.  Lymphadenopathy:     Cervical: No cervical adenopathy.  Skin:    General: Skin is warm and dry.     Findings: No rash.  Neurological:     General: No focal deficit present.     Mental Status: He is alert and oriented to person, place, and time.  Psychiatric:        Mood and Affect: Mood normal.        Behavior: Behavior normal.        Thought Content: Thought content normal.        Judgment: Judgment normal.      Results for orders placed or performed during the hospital encounter of 06/26/21  CBC with Differential  Result Value Ref Range   WBC 10.8 (H) 4.0 - 10.5 K/uL   RBC 5.50 4.22 - 5.81 MIL/uL   Hemoglobin 15.7 13.0 - 17.0 g/dL   HCT 48.8 39.0 - 52.0 %   MCV 88.7 80.0 - 100.0 fL   MCH 28.5 26.0 - 34.0 pg   MCHC 32.2 30.0 - 36.0 g/dL   RDW 13.2 11.5 - 15.5 %   Platelets 335 150 - 400 K/uL   nRBC 0.0 0.0 - 0.2 %   Neutrophils Relative % 77 %    Neutro Abs 8.3 (H) 1.7 - 7.7 K/uL   Lymphocytes Relative 15 %   Lymphs Abs 1.6 0.7 - 4.0 K/uL   Monocytes Relative 7 %   Monocytes Absolute 0.8 0.1 - 1.0 K/uL   Eosinophils Relative 1 %   Eosinophils Absolute 0.1 0.0 - 0.5 K/uL   Basophils Relative 0 %   Basophils Absolute 0.0 0.0 - 0.1 K/uL   Immature Granulocytes 0 %   Abs Immature Granulocytes 0.04 0.00 - 0.07 K/uL  Comprehensive metabolic panel  Result Value Ref Range   Sodium 132 (L) 135 - 145 mmol/L   Potassium 4.3 3.5 - 5.1 mmol/L   Chloride 99 98 - 111 mmol/L   CO2 25 22 - 32 mmol/L   Glucose, Bld 104 (H) 70 - 99 mg/dL   BUN 17 8 - 23 mg/dL   Creatinine, Ser 0.98 0.61 - 1.24 mg/dL   Calcium 8.5 (L) 8.9 - 10.3 mg/dL   Total Protein 7.6 6.5 - 8.1 g/dL   Albumin 3.3 (L) 3.5 - 5.0 g/dL   AST 24 15 - 41 U/L   ALT 25 0 - 44 U/L   Alkaline Phosphatase 198 (H) 38 - 126 U/L   Total Bilirubin 0.5 0.3 - 1.2 mg/dL   GFR, Estimated >60 >60 mL/min   Anion gap 8 5 - 15  Lipase, blood  Result Value Ref Range   Lipase 28 11 - 51 U/L    Assessment & Plan:  This visit occurred during the SARS-CoV-2 public health emergency.  Safety protocols were in place, including screening questions prior to the visit, additional  usage of staff PPE, and extensive cleaning of exam room while observing appropriate contact time as indicated for disinfecting solutions.   Problem List Items Addressed This Visit     Healthcare maintenance - Primary (Chronic)    Preventative protocols reviewed and updated unless pt declined. Discussed healthy diet and lifestyle.       Advanced directives, counseling/discussion (Chronic)    Advanced directive discussion - has this at home. Wife would be HCPOA. Asked to bring [place] a copy.       GERD (gastroesophageal reflux disease)    Chronic stable on esomeprazole 1m daily - refilled.       Relevant Medications   esomeprazole (NEXIUM) 40 MG capsule   HLD (hyperlipidemia)    Chronic, stable period on  cholestyramine. The 10-year ASCVD risk score (Arnett DK, et al., 2019) is: 13%   Values used to calculate the score:     Age: [age]years     Sex: Male     Is Non-Hispanic African American: No     Diabetic: No     Tobacco smoker: No     Systolic Blood Pressure: 1935mmHg     Is BP treated: No     HDL Cholesterol: 49.7 mg/dL     Total Cholesterol: 140 mg/dL       Relevant Medications   cholestyramine (QUESTRAN) 4 g packet   Persistent cough    Notes recurrent bronchitis each year with lingering cough for months.  CT chest from late last year showed biapical scarring.  No smoking history.  Will return for spirometry in a few months - want to given lungs more time to heal from recent COVID infection.       Prediabetes    Reviewed increasing A1c, encouraged limiting added sugars, simple carbs and sweetened beverages in diet.      Chronic diarrhea    Significant improvement since starting cholestyramine - will continue.       S/P laparoscopic cholecystectomy   Liver abscess    S/p abscess drainage by IR earlier in the month then JP drain removal by surgery this week.  Culture returned with no growth. He completed augmentin course then flagyl course.  Appreciate IR and gen surgery care.  Will return in 3-4 months for labs to monitor for recurrence (ESR, CBC, CMP).       Relevant Orders   Sedimentation rate   CBC with Differential/Platelet   Comprehensive metabolic panel   Other Visit Diagnoses     Need for vaccination against Streptococcus pneumoniae       Relevant Orders   Pneumococcal conjugate vaccine 20-valent (Completed)   Need for influenza vaccination       Relevant Orders   Flu Vaccine QUAD High Dose(Fluad) (Completed)        Meds ordered this encounter  Medications   cholestyramine (QUESTRAN) 4 g packet    Sig: Take 1 packet (4 g total) by mouth 2 (two) times daily before a meal.    Dispense:  180 packet    Refill:  3   esomeprazole (NEXIUM) 40 MG capsule     Sig: Take 1 capsule (40 mg total) by mouth daily at 12 noon.    Dispense:  90 capsule    Refill:  3   Orders Placed This Encounter  Procedures   Flu Vaccine QUAD High Dose(Fluad)   Pneumococcal conjugate vaccine 20-valent   Sedimentation rate    Standing Status:   Future    Standing Expiration  Date:   07/19/2022   CBC with Differential/Platelet    Standing Status:   Future    Standing Expiration Date:   07/19/2022   Comprehensive metabolic panel    Standing Status:   Future    Standing Expiration Date:   07/19/2022     Patient instructions: Flu shot today  Prevnar-20 today.  If interested, check with pharmacy about new 2 shot shingles series (shingrix).  Bring [place] a copy of your advanced directive  Good to see you today. Return as needed or in 1 year for next physical.  Return in 3-4 months for labs and spirometry.   Follow up plan: No follow-ups on file.  Ria Bush, MD

## 2021-07-19 NOTE — Assessment & Plan Note (Addendum)
S/p abscess drainage by IR earlier in the month then JP drain removal by surgery this week.  Culture returned with no growth. He completed augmentin course then flagyl course.  Appreciate IR and gen surgery care.  Will return in 3-4 months for labs to monitor for recurrence (ESR, CBC, CMP).

## 2021-08-07 ENCOUNTER — Ambulatory Visit: Payer: Medicare HMO | Admitting: Surgery

## 2021-08-07 ENCOUNTER — Telehealth: Payer: Self-pay | Admitting: Family Medicine

## 2021-08-07 NOTE — Telephone Encounter (Signed)
Spoke with pt asking if any other sxs.  Denies any other sxs.  States he had COVID last month.  Cough had improved, didn't completely resolve. But now has prod cough with phlegm when going to bed or when first waking up in mornings. Offered OV but states he just saw Dr. Darnell Level for the cough.  Wants suggestion on what he can take. Pt is aware Dr. Darnell Level is out of office. Plz advise.

## 2021-08-07 NOTE — Telephone Encounter (Signed)
Mark Martinez called in and stated that he saw Dr. Darnell Level a couple of weeks ago for his CPE and he was coughing but nothing was coming up, but here recently he has been coughing up phlegm and wanted to know if its something he can get for it.

## 2021-08-07 NOTE — Telephone Encounter (Signed)
Mucinex DM may help  F/u if no improvement

## 2021-08-08 NOTE — Telephone Encounter (Signed)
Spoke with pt relaying Dr. Marliss Coots message.  Pt verbalizes understanding and expresses his thanks.

## 2021-08-23 ENCOUNTER — Other Ambulatory Visit: Payer: Self-pay

## 2021-08-23 ENCOUNTER — Encounter: Payer: Self-pay | Admitting: Surgery

## 2021-08-23 ENCOUNTER — Ambulatory Visit: Payer: Medicare HMO | Admitting: Surgery

## 2021-08-23 VITALS — BP 118/83 | HR 77 | Temp 98.5°F | Ht 72.0 in | Wt 211.2 lb

## 2021-08-23 DIAGNOSIS — K750 Abscess of liver: Secondary | ICD-10-CM

## 2021-08-23 NOTE — Patient Instructions (Signed)
You may go ahead and have your labs done anytime at [facility] entrance. ? ?We will call you about scheduling your CT scan and follow up afterwards. ? ? ?

## 2021-08-24 ENCOUNTER — Telehealth: Payer: Self-pay | Admitting: Family Medicine

## 2021-08-24 ENCOUNTER — Telehealth: Payer: Self-pay

## 2021-08-24 NOTE — Telephone Encounter (Signed)
Call to patient to go over imaging information and to schedule a follow up appointment with [name]. ? ?The patient is schedule for a CT scan of the abdomen and pelvis at [facility] on 09/07/21. He will need to arrive at the [place]  entrance at 8:00 am and have nothing to eat or drink for 4 hours prior. He will need to pick up a Prep kit for this exam and may do so at [facility] entrance or at the [place] center. He will also need to have his lab work done prior to his scan.  ?He will follow up with [name] after we get the results.  ?

## 2021-08-24 NOTE — Telephone Encounter (Signed)
Spoke with pt to discuss medical concerns.  C/o still having cough since COVID in 01/23.   Dr. Darnell Level doesn't have any appts.  Offered OV with another provider.  Pt agreed and scheduled with Matt on 08/25/21 at 2:00. ?

## 2021-08-24 NOTE — Progress Notes (Signed)
Outpatient Surgical Follow Up ? ?08/24/2021 ? ?Mark Martinez is an 71 y.o. male.  ? ?Chief Complaint  ?Patient presents with  ? Follow-up  ?  Liver abscess  ? ? ?HPI: Mark Martinez is a 71 year old male well-known to me with a prior history of cholecystectomy over a year and 3 months ago to develop hepatic abscess a couple months ago treated with percutaneous drain and antibiotics. ?Overall he feels better but he continues to have intermittent right upper quadrant pain.  He is concerned that the abscess has recurred.  No fevers no chills he is tolerating diet.  He is walking. ?I have personally reviewed the prior CT that had good improvement. ? ?Past Medical History:  ?Diagnosis Date  ? Cataract   ? COVID-19 virus infection 06/2019  ? ED (erectile dysfunction)   ? Elevated LFTs   ? Esophageal stricture   ? GERD (gastroesophageal reflux disease)   ? with hiatal hernia  ? H/O hiatal hernia   ? History of prostatitis   ? HLD (hyperlipidemia)   ? Prediabetes 07/28/2015  ? Shortness of breath   ? exersion  ? Tubular adenoma of colon   ? ? ?Past Surgical History:  ?Procedure Laterality Date  ? BACK SURGERY  1994  ? lumbar area  ? CHOLECYSTECTOMY    ? COLONOSCOPY  2005  ? WNL, rpt 10 yrs  ? COLONOSCOPY  07/2019  ? 1 TA, hemorrhoids, rpt 7 yrs [name])  ? CYST REMOVAL NECK Left 08/31/2013  ? Procedure: EXCISION EPIDERMAL INCLUSION CYST LEFT NECK;  Surgeon: Zenovia Jarred, MD  ? ESOPHAGEAL DILATION    ? three last  ? ESOPHAGOGASTRODUODENOSCOPY  12-27-1996  ? hiatal hernia (Dr. Sharlett Iles)  ? SHOULDER ARTHROSCOPY WITH ROTATOR CUFF REPAIR AND SUBACROMIAL DECOMPRESSION Right 01/2020  ? distal clavicle resection, RTC repair (Supple)  ? ? ?Family History  ?Problem Relation Age of Onset  ? Valvular heart disease Mother   ? Ulcers Father   ? CAD Father 3  ?     multiple stents  ? CAD Paternal Uncle   ? Cancer Neg Hx   ? Diabetes Neg Hx   ? Stroke Neg Hx   ? Colon cancer Neg Hx   ? Esophageal cancer Neg Hx   ? Pancreatic cancer Neg Hx   ?  Liver disease Neg Hx   ? Stomach cancer Neg Hx   ? ? ?Social History:  reports that he has never smoked. He quit smokeless tobacco use about 12 years ago. He reports that he does not drink alcohol and does not use drugs. ? ?Allergies:  ?Allergies  ?Allergen Reactions  ? Codeine Other (See Comments)  ?  Keeps him awake  ? ? ?Medications reviewed. ? ? ? ?ROS ?Full ROS performed and is otherwise negative other than what is stated in HPI ? ? ?BP 118/83   Pulse 77   Temp 98.5 ?F (36.9 ?C) (Oral)   Ht 6' (1.829 m)   Wt 211 lb 3.2 oz (95.8 kg)   SpO2 92%   BMI 28.64 kg/m?  ? ?Physical Exam ?Vitals and nursing note reviewed. Exam conducted with a chaperone present.  ?Constitutional:   ?   General: He is not in acute distress. ?   Appearance: Normal appearance.  ?Cardiovascular:  ?   Rate and Rhythm: Normal rate and regular rhythm.  ?   Heart sounds: No murmur heard. ?Pulmonary:  ?   Effort: Pulmonary effort is normal.  ?   Breath sounds: Normal  breath sounds.  ?Abdominal:  ?   General: Abdomen is flat. There is no distension.  ?   Palpations: Abdomen is soft. There is no mass.  ?   Tenderness: There is abdominal tenderness. There is no guarding.  ?   Hernia: No hernia is present.  ?Musculoskeletal:  ?   Cervical back: Normal range of motion and neck supple. No rigidity or tenderness.  ?Lymphadenopathy:  ?   Cervical: No cervical adenopathy.  ?Skin: ?   General: Skin is warm and dry.  ?   Capillary Refill: Capillary refill takes less than 2 seconds.  ?Neurological:  ?   General: No focal deficit present.  ?   Mental Status: He is alert and oriented to person, place, and time.  ?Psychiatric:     ?   Mood and Affect: Mood normal.     ?   Behavior: Behavior normal.     ?   Thought Content: Thought content normal.     ?   Judgment: Judgment normal.  ? ? ? ? ?Assessment/Plan: ?71 year old male with higher history of cholecystectomy and over a year later developed perihepatic abscess. ?Given persistent pain we will obtain a  CT scan of the abdomen pelvis to evaluate the hepatic collection. ?Is not toxic he does not require hospitalization. ?Is not that I spent 30 minutes in this encounter including coordination of his care, personally reviewing imaging studies, placing orders, counseling the patient and performing appropriate documentation ? ? ?Caroleen Hamman, MD FACS ?General Surgeon  ?

## 2021-08-24 NOTE — Telephone Encounter (Signed)
Pt called in requesting a called back stated he has medical questions and concern . Please advise [phone]  ?

## 2021-08-25 ENCOUNTER — Encounter: Payer: Self-pay | Admitting: Nurse Practitioner

## 2021-08-25 ENCOUNTER — Ambulatory Visit (INDEPENDENT_AMBULATORY_CARE_PROVIDER_SITE_OTHER)
Admission: RE | Admit: 2021-08-25 | Discharge: 2021-08-25 | Disposition: A | Discharge status: Home / Self Care | Payer: Medicare HMO | Source: Ambulatory Visit | Attending: Nurse Practitioner | Admitting: Nurse Practitioner

## 2021-08-25 ENCOUNTER — Other Ambulatory Visit
Admission: RE | Admit: 2021-08-25 | Discharge: 2021-08-25 | Disposition: A | Discharge status: Home / Self Care | Payer: Medicare HMO | Attending: Surgery | Admitting: Surgery

## 2021-08-25 ENCOUNTER — Ambulatory Visit (INDEPENDENT_AMBULATORY_CARE_PROVIDER_SITE_OTHER): Payer: Medicare HMO | Admitting: Nurse Practitioner

## 2021-08-25 ENCOUNTER — Other Ambulatory Visit: Payer: Self-pay

## 2021-08-25 VITALS — BP 112/78 | HR 69 | Temp 97.0°F | Resp 14 | Ht 72.0 in | Wt 215.4 lb

## 2021-08-25 DIAGNOSIS — R053 Chronic cough: Secondary | ICD-10-CM

## 2021-08-25 DIAGNOSIS — R0689 Other abnormalities of breathing: Secondary | ICD-10-CM | POA: Insufficient documentation

## 2021-08-25 DIAGNOSIS — R062 Wheezing: Secondary | ICD-10-CM | POA: Insufficient documentation

## 2021-08-25 DIAGNOSIS — J90 Pleural effusion, not elsewhere classified: Secondary | ICD-10-CM | POA: Diagnosis not present

## 2021-08-25 DIAGNOSIS — K750 Abscess of liver: Secondary | ICD-10-CM | POA: Diagnosis not present

## 2021-08-25 LAB — CBC WITH DIFFERENTIAL/PLATELET
Abs Immature Granulocytes: 0.03 10*3/uL (ref 0.00–0.07)
Basophils Absolute: 0.1 10*3/uL (ref 0.0–0.1)
Basophils Relative: 1 %
Eosinophils Absolute: 0.5 10*3/uL (ref 0.0–0.5)
Eosinophils Relative: 6 %
HCT: 48.3 % (ref 39.0–52.0)
Hemoglobin: 15.6 g/dL (ref 13.0–17.0)
Immature Granulocytes: 0 %
Lymphocytes Relative: 27 %
Lymphs Abs: 2.2 10*3/uL (ref 0.7–4.0)
MCH: 29.4 pg (ref 26.0–34.0)
MCHC: 32.3 g/dL (ref 30.0–36.0)
MCV: 91.1 fL (ref 80.0–100.0)
Monocytes Absolute: 0.5 10*3/uL (ref 0.1–1.0)
Monocytes Relative: 6 %
Neutro Abs: 5 10*3/uL (ref 1.7–7.7)
Neutrophils Relative %: 60 %
Platelets: 302 10*3/uL (ref 150–400)
RBC: 5.3 MIL/uL (ref 4.22–5.81)
RDW: 15.3 % (ref 11.5–15.5)
WBC: 8.2 10*3/uL (ref 4.0–10.5)
nRBC: 0 % (ref 0.0–0.2)

## 2021-08-25 LAB — COMPREHENSIVE METABOLIC PANEL
ALT: 28 U/L (ref 0–44)
AST: 28 U/L (ref 15–41)
Albumin: 3.4 g/dL — ABNORMAL LOW (ref 3.5–5.0)
Alkaline Phosphatase: 153 U/L — ABNORMAL HIGH (ref 38–126)
Anion gap: 6 (ref 5–15)
BUN: 16 mg/dL (ref 8–23)
CO2: 28 mmol/L (ref 22–32)
Calcium: 8.7 mg/dL — ABNORMAL LOW (ref 8.9–10.3)
Chloride: 104 mmol/L (ref 98–111)
Creatinine, Ser: 1.01 mg/dL (ref 0.61–1.24)
GFR, Estimated: >60 mL/min (ref >60)
Glucose, Bld: 104 mg/dL — ABNORMAL HIGH (ref 70–99)
Potassium: 4.4 mmol/L (ref 3.5–5.1)
Sodium: 138 mmol/L (ref 135–145)
Total Bilirubin: 0.5 mg/dL (ref 0.3–1.2)
Total Protein: 7.5 g/dL (ref 6.5–8.1)

## 2021-08-25 MED ORDER — AZITHROMYCIN 250 MG PO TABS: ORAL_TABLET | ORAL | 0 refills | Status: AC

## 2021-08-25 MED ORDER — CETIRIZINE HCL 10 MG PO TABS
10.0000 mg | ORAL_TABLET | Freq: Every day | ORAL | 0 refills | Status: DC
Start: 2021-08-25 — End: 2023-07-02

## 2021-08-25 MED ORDER — PREDNISONE 20 MG PO TABS: ORAL_TABLET | ORAL | 0 refills | Status: AC

## 2021-08-25 NOTE — Assessment & Plan Note (Signed)
Patient has been having ongoing cough for approximately 5 months now.  Has waxing and waning upper respiratory symptoms.  Recently had CT scan of chest without contrast 11- 2022.  CT scan did not show any reasoning for cough shortness of breath or any signs of lung disease.  Patient is not a smoker never has smoked.  Has had some small past passive exposure as a child.  Patient is already on a PPI.  We will treat patient with azithromycin 250 mg pack along with prednisone and antihistamine.  Pending chest x-ray ?

## 2021-08-25 NOTE — Assessment & Plan Note (Signed)
Wheezing noted on exam and right upper lobe.  We will start patient on prednisone, he has tolerated well in the past.  Follow-up if symptoms do not improve.  Pending chest x-ray ?

## 2021-08-25 NOTE — Assessment & Plan Note (Signed)
And rhonchi throughout right lung.  Pending chest x-ray ?

## 2021-08-25 NOTE — Patient Instructions (Signed)
Nice to see you today ?I will be in touch with the xray results ?Sent 3 prescriptions to the pharmacy for you ?Follow up if no improvement ?

## 2021-08-25 NOTE — Telephone Encounter (Signed)
Patient contacted and imaging information reviewed. Patient will have labs done today. Follow up scheduled with [name]. ?

## 2021-08-25 NOTE — Progress Notes (Signed)
Acute Office Visit  Subjective:    Patient ID: Mark Martinez, male    DOB: [birth year], 71 y.o.   MRN: [id number]  Chief Complaint  Patient presents with   Cough    Had bronchitis in October 2022 had some lingering cough after that, then had Covid in January 2023 and then a cold and still continue to having lingering cough. Head congestion off and on. No fever. More SOB than usual.     Patient is in today for   Bronchitis back in 03/2021 was seen at [facility] and was given abx and did not clear it up. He then saw PCP and was written abx and prednisione. Had a cough from that. Got Covid in Jan and had a cough. He was told take mucinex and was turned into a cold last week.  States 2 days ago was seen by Gen surgery. States the right lung does not sound good   Had passive smoke exposure from a kid for a short period of time. He recently had a CT scan of Chest without contrast on 04/2021 that did not show any signs or lung disease  Past Medical History:  Diagnosis Date   Cataract    COVID-19 virus infection 06/2019   ED (erectile dysfunction)    Elevated LFTs    Esophageal stricture    GERD (gastroesophageal reflux disease)    with hiatal hernia   H/O hiatal hernia    History of prostatitis    HLD (hyperlipidemia)    Prediabetes 07/28/2015   Shortness of breath    exersion   Tubular adenoma of colon     Past Surgical History:  Procedure Laterality Date   BACK SURGERY  1994   lumbar area   CHOLECYSTECTOMY     COLONOSCOPY  2005   WNL, rpt 10 yrs   COLONOSCOPY  07/2019   1 TA, hemorrhoids, rpt 7 yrs [name])   CYST REMOVAL NECK Left 08/31/2013   Procedure: EXCISION EPIDERMAL INCLUSION CYST LEFT NECK;  Surgeon: Zenovia Jarred, MD   ESOPHAGEAL DILATION     three last   ESOPHAGOGASTRODUODENOSCOPY  12-27-1996   hiatal hernia (Dr. Sharlett Iles)   SHOULDER ARTHROSCOPY WITH ROTATOR CUFF REPAIR AND SUBACROMIAL DECOMPRESSION Right 01/2020   distal clavicle resection, RTC repair (Supple)     Family History  Problem Relation Age of Onset   Valvular heart disease Mother    Ulcers Father    CAD Father 57       multiple stents   CAD Paternal Uncle    Cancer Neg Hx    Diabetes Neg Hx    Stroke Neg Hx    Colon cancer Neg Hx    Esophageal cancer Neg Hx    Pancreatic cancer Neg Hx    Liver disease Neg Hx    Stomach cancer Neg Hx     Social History   Socioeconomic History   Marital status: Married    Spouse name: Not on file   Number of children: 3   Years of education: Not on file   Highest education level: Not on file  Occupational History   Occupation: seeding and landscaping services    Employer: OTHER  Tobacco Use   Smoking status: Never   Smokeless tobacco: Former    Quit date: 01/30/2009  Vaping Use   Vaping Use: Never used  Substance and Sexual Activity   Alcohol use: No   Drug use: No   Sexual activity: Not Currently  Other Topics  Concern   Not on file  Social History Narrative   Caffeine: rare   Lives with wife, 1 dog.   Grown children   Occupation: Works in Biomedical scientist.   Activity: stays active at work   Diet: daily fruits/vegetables, some water   Social Determinants of Health   Financial Resource Strain: Low Risk    Difficulty of Paying Living Expenses: Not hard at all  Food Insecurity: No Food Insecurity   Worried About Charity fundraiser in the Last Year: Never true   Arboriculturist in the Last Year: Never true  Transportation Needs: No Transportation Needs   Lack of Transportation (Medical): No   Lack of Transportation (Non-Medical): No  Physical Activity: Insufficiently Active   Days of Exercise per Week: 3 days   Minutes of Exercise per Session: 20 min  Stress: No Stress Concern Present   Feeling of Stress : Not at all  Social Connections: Socially Integrated   Frequency of Communication with Friends and Family: More than three times a week   Frequency of Social Gatherings with Friends and Family: More than three times a  week   Attends Religious Services: More than 4 times per year   Active Member of [employer] or Organizations: Yes   Attends Music therapist: More than 4 times per year   Marital Status: Married  Human resources officer Violence: Not At Risk   Fear of Current or Ex-Partner: No   Emotionally Abused: No   Physically Abused: No   Sexually Abused: No    Outpatient Medications Prior to Visit  Medication Sig Dispense Refill   acetaminophen (TYLENOL) 325 MG tablet Take 650 mg by mouth every 6 (six) hours as needed for moderate pain.      albuterol (VENTOLIN HFA) 108 (90 Base) MCG/ACT inhaler Inhale into the lungs.     cholestyramine (QUESTRAN) 4 g packet Take 4 g by mouth 2 (two) times daily.     esomeprazole (NEXIUM) 40 MG capsule Take 1 capsule (40 mg total) by mouth daily at 12 noon. 90 capsule 3   sildenafil (REVATIO) 20 MG tablet Take 3-5 tablets (60-100 mg total) by mouth daily as needed (relations). 60 tablet 6   No facility-administered medications prior to visit.    Allergies  Allergen Reactions   Codeine Other (See Comments)    Keeps him awake    Review of Systems  Constitutional:  Negative for chills and fever.  HENT:  Positive for congestion (intermittent). Negative for ear discharge, ear pain and sore throat.   Respiratory:  Positive for cough (not really colored) and shortness of breath (doe).   Gastrointestinal:  Negative for diarrhea, nausea and vomiting.  Neurological:  Negative for dizziness, light-headedness and headaches.      Objective:    Physical Exam Vitals and nursing note reviewed.  Constitutional:      Appearance: Normal appearance.  HENT:     Right Ear: Tympanic membrane, ear canal and external ear normal.     Left Ear: Tympanic membrane, ear canal and external ear normal.     Mouth/Throat:     Mouth: Mucous membranes are moist.     Pharynx: Oropharynx is clear.  Cardiovascular:     Rate and Rhythm: Normal rate and regular rhythm.     Heart  sounds: Normal heart sounds.  Pulmonary:     Effort: Pulmonary effort is normal.     Breath sounds: Examination of the right-upper field reveals wheezing. Examination of the  right-lower field reveals rhonchi. Wheezing and rhonchi present.  Abdominal:     General: Bowel sounds are normal.  Lymphadenopathy:     Cervical: No cervical adenopathy.  Skin:    General: Skin is warm.  Neurological:     Mental Status: He is alert.    BP 112/78    Pulse 69    Temp (!) 97 F (36.1 C)    Resp 14    Ht 6' (1.829 m)    Wt 215 lb 6 oz (97.7 kg)    SpO2 97%    BMI 29.21 kg/m  Wt Readings from Last 3 Encounters:  08/25/21 215 lb 6 oz (97.7 kg)  08/23/21 211 lb 3.2 oz (95.8 kg)  07/19/21 206 lb 5 oz (93.6 kg)    Health Maintenance Due  Topic Date Due   Zoster Vaccines- Shingrix (1 of 2) Never done   COVID-19 Vaccine (3 - Pfizer risk series) 11/14/2019    There are no preventive care reminders to display for this patient.   Lab Results  Component Value Date   TSH 3.29 09/09/2020   Lab Results  Component Value Date   WBC 10.8 (H) 06/26/2021   HGB 15.7 06/26/2021   HCT 48.8 06/26/2021   MCV 88.7 06/26/2021   PLT 335 06/26/2021   Lab Results  Component Value Date   NA 132 (L) 06/26/2021   K 4.3 06/26/2021   CO2 25 06/26/2021   GLUCOSE 104 (H) 06/26/2021   BUN 17 06/26/2021   CREATININE 0.98 06/26/2021   BILITOT 0.5 06/26/2021   ALKPHOS 198 (H) 06/26/2021   AST 24 06/26/2021   ALT 25 06/26/2021   PROT 7.6 06/26/2021   ALBUMIN 3.3 (L) 06/26/2021   CALCIUM 8.5 (L) 06/26/2021   ANIONGAP 8 06/26/2021   GFR 80.86 06/09/2021   Lab Results  Component Value Date   CHOL 140 06/09/2021   Lab Results  Component Value Date   HDL 49.70 06/09/2021   Lab Results  Component Value Date   LDLCALC 73 06/09/2021   Lab Results  Component Value Date   TRIG 87.0 06/09/2021   Lab Results  Component Value Date   CHOLHDL 3 06/09/2021   Lab Results  Component Value Date   HGBA1C 6.4  06/09/2021       Assessment & Plan:   Problem List Items Addressed This Visit       Other   Chronic cough - Primary    Patient has been having ongoing cough for approximately 5 months now.  Has waxing and waning upper respiratory symptoms.  Recently had CT scan of chest without contrast 11- 2022.  CT scan did not show any reasoning for cough shortness of breath or any signs of lung disease.  Patient is not a smoker never has smoked.  Has had some small past passive exposure as a child.  Patient is already on a PPI.  We will treat patient with azithromycin 250 mg pack along with prednisone and antihistamine.  Pending chest x-ray      Relevant Orders   DG Chest 2 View   Wheezing    Wheezing noted on exam and right upper lobe.  We will start patient on prednisone, he has tolerated well in the past.  Follow-up if symptoms do not improve.  Pending chest x-ray      Relevant Medications   predniSONE (DELTASONE) 20 MG tablet   Adventitious breath sounds    And rhonchi throughout right lung.  Pending chest  x-ray      Relevant Medications   azithromycin (ZITHROMAX) 250 MG tablet   predniSONE (DELTASONE) 20 MG tablet   cetirizine (ZYRTEC) 10 MG tablet   Other Relevant Orders   DG Chest 2 View     Meds ordered this encounter  Medications   azithromycin (ZITHROMAX) 250 MG tablet    Sig: Take 2 tablets on day 1, then 1 tablet daily on days 2 through 5    Dispense:  6 tablet    Refill:  0    Order Specific Question:   Supervising Provider    Answer:   Loura Pardon A [[id number]]   predniSONE (DELTASONE) 20 MG tablet    Sig: Take 1 tablet (20 mg total) by mouth 2 (two) times daily with a meal for 3 days, THEN 1 tablet (20 mg total) daily with breakfast for 3 days.    Dispense:  9 tablet    Refill:  0    Order Specific Question:   Supervising Provider    Answer:   Glori Bickers MARNE A [[id number]]   cetirizine (ZYRTEC) 10 MG tablet    Sig: Take 1 tablet (10 mg total) by mouth daily.    Dispense:  30  tablet    Refill:  0    Order Specific Question:   Supervising Provider    Answer:   Loura Pardon A [[id number]]   This visit occurred during the SARS-CoV-2 public health emergency.  Safety protocols were in place, including screening questions prior to the visit, additional usage of staff PPE, and extensive cleaning of exam room while observing appropriate contact time as indicated for disinfecting solutions.    Romilda Garret, NP

## 2021-08-29 ENCOUNTER — Other Ambulatory Visit: Payer: Self-pay | Admitting: Nurse Practitioner

## 2021-08-29 DIAGNOSIS — J90 Pleural effusion, not elsewhere classified: Secondary | ICD-10-CM

## 2021-08-29 MED ORDER — FUROSEMIDE 20 MG PO TABS: 20.0000 mg | ORAL_TABLET | Freq: Every day | ORAL | 0 refills | Status: DC

## 2021-09-01 ENCOUNTER — Other Ambulatory Visit: Payer: Self-pay | Admitting: Nurse Practitioner

## 2021-09-01 ENCOUNTER — Telehealth: Payer: Self-pay

## 2021-09-01 DIAGNOSIS — J90 Pleural effusion, not elsewhere classified: Secondary | ICD-10-CM

## 2021-09-01 NOTE — Telephone Encounter (Signed)
Patient advised. Please place orders for labs. Thank you ?

## 2021-09-01 NOTE — Telephone Encounter (Signed)
Please review. Appointment for labs scheduled for 09/04/21. Dr Rosaria Ferries did labs on 08/25/21 in [place]. Does patient need to keep his appointment ?

## 2021-09-01 NOTE — Telephone Encounter (Signed)
Mark Martinez, looks like you scheduled pt's lab visit for 08/17/21.  Is this in reference to his appt with [facility] and does pt need to keep lab appt?  ?

## 2021-09-01 NOTE — Telephone Encounter (Signed)
Yes because I wrote a fluid pill and need to make sure that his kidneys and potassium are ok ?

## 2021-09-01 NOTE — Telephone Encounter (Signed)
Patient is calling because he is scheduled for labs on Monday. Patient reports his surgeon as already ran some labs recently. Patient is needing to know if he need to keep his scheduled appointment. Please advise

## 2021-09-04 ENCOUNTER — Other Ambulatory Visit: Payer: Self-pay

## 2021-09-04 ENCOUNTER — Other Ambulatory Visit (INDEPENDENT_AMBULATORY_CARE_PROVIDER_SITE_OTHER): Payer: Medicare HMO

## 2021-09-04 DIAGNOSIS — K750 Abscess of liver: Secondary | ICD-10-CM

## 2021-09-04 LAB — CBC WITH DIFFERENTIAL/PLATELET
Basophils Absolute: 0 10*3/uL (ref 0.0–0.1)
Basophils Relative: 0.6 % (ref 0.0–3.0)
Eosinophils Absolute: 0.3 10*3/uL (ref 0.0–0.7)
Eosinophils Relative: 4.1 % (ref 0.0–5.0)
HCT: 48.5 % (ref 39.0–52.0)
Hemoglobin: 15.9 g/dL (ref 13.0–17.0)
Lymphocytes Relative: 20.7 % (ref 12.0–46.0)
Lymphs Abs: 1.6 10*3/uL (ref 0.7–4.0)
MCHC: 32.8 g/dL (ref 30.0–36.0)
MCV: 91.3 fl (ref 78.0–100.0)
Monocytes Absolute: 0.7 10*3/uL (ref 0.1–1.0)
Monocytes Relative: 9 % (ref 3.0–12.0)
Neutro Abs: 5.2 10*3/uL (ref 1.4–7.7)
Neutrophils Relative %: 65.6 % (ref 43.0–77.0)
Platelets: 296 10*3/uL (ref 150.0–400.0)
RBC: 5.32 Mil/uL (ref 4.22–5.81)
RDW: 16.1 % — ABNORMAL HIGH (ref 11.5–15.5)
WBC: 7.9 10*3/uL (ref 4.0–10.5)

## 2021-09-04 LAB — COMPREHENSIVE METABOLIC PANEL
ALT: 21 U/L (ref 0–53)
AST: 17 U/L (ref 0–37)
Albumin: 3.8 g/dL (ref 3.5–5.2)
Alkaline Phosphatase: 141 U/L — ABNORMAL HIGH (ref 39–117)
BUN: 16 mg/dL (ref 6–23)
CO2: 32 mEq/L (ref 19–32)
Calcium: 9.1 mg/dL (ref 8.4–10.5)
Chloride: 100 mEq/L (ref 96–112)
Creatinine, Ser: 1.16 mg/dL (ref 0.40–1.50)
GFR: 64.33 mL/min (ref >60.00)
Glucose, Bld: 86 mg/dL (ref 70–99)
Potassium: 4.9 mEq/L (ref 3.5–5.1)
Sodium: 137 mEq/L (ref 135–145)
Total Bilirubin: 0.6 mg/dL (ref 0.2–1.2)
Total Protein: 6.6 g/dL (ref 6.0–8.3)

## 2021-09-04 LAB — SEDIMENTATION RATE: Sed Rate: 29 mm/hr — ABNORMAL HIGH (ref 0–20)

## 2021-09-07 ENCOUNTER — Ambulatory Visit
Admission: RE | Admit: 2021-09-07 | Discharge: 2021-09-07 | Disposition: A | Discharge status: Home / Self Care | Payer: Medicare HMO | Source: Ambulatory Visit | Attending: Surgery | Admitting: Surgery

## 2021-09-07 ENCOUNTER — Other Ambulatory Visit: Payer: Self-pay

## 2021-09-07 DIAGNOSIS — J90 Pleural effusion, not elsewhere classified: Secondary | ICD-10-CM | POA: Diagnosis not present

## 2021-09-07 DIAGNOSIS — K750 Abscess of liver: Secondary | ICD-10-CM | POA: Diagnosis not present

## 2021-09-07 DIAGNOSIS — K829 Disease of gallbladder, unspecified: Secondary | ICD-10-CM | POA: Diagnosis not present

## 2021-09-07 DIAGNOSIS — R1011 Right upper quadrant pain: Secondary | ICD-10-CM | POA: Diagnosis not present

## 2021-09-07 DIAGNOSIS — K449 Diaphragmatic hernia without obstruction or gangrene: Secondary | ICD-10-CM | POA: Diagnosis not present

## 2021-09-07 MED ORDER — IOHEXOL 300 MG/ML  SOLN
100.0000 mL | Freq: Once | INTRAMUSCULAR | Status: AC | PRN
  Administered 2021-09-07: 100 mL via INTRAVENOUS

## 2021-09-13 ENCOUNTER — Telehealth: Payer: Self-pay | Admitting: Family Medicine

## 2021-09-13 ENCOUNTER — Ambulatory Visit (INDEPENDENT_AMBULATORY_CARE_PROVIDER_SITE_OTHER): Payer: Medicare HMO | Admitting: Surgery

## 2021-09-13 ENCOUNTER — Encounter: Payer: Self-pay | Admitting: Surgery

## 2021-09-13 ENCOUNTER — Other Ambulatory Visit: Payer: Self-pay

## 2021-09-13 VITALS — BP 112/76 | HR 60 | Temp 98.2°F | Ht 72.0 in | Wt 208.0 lb

## 2021-09-13 DIAGNOSIS — K750 Abscess of liver: Secondary | ICD-10-CM

## 2021-09-13 MED ORDER — SILDENAFIL CITRATE 20 MG PO TABS: 60.0000 mg | ORAL_TABLET | Freq: Every day | ORAL | 6 refills | Status: DC | PRN

## 2021-09-13 NOTE — Telephone Encounter (Signed)
1.Medication Requested:  ?sildenafil (REVATIO) 20 MG tablet ?2. Pharmacy (Name, Street, [place]):  ?[id number] - [place], [place] ?3. On Med List: yes  ? ?4. Last Visit with PCP: 2.1.23 ? ?5. Next visit date with PCP: 4.4.23 ? ? ?Agent: Please be advised that RX refills may take up to 3 business days. We ask that you follow-up with your pharmacy. ? ?

## 2021-09-13 NOTE — Telephone Encounter (Signed)
Refill request sildenafil ?Last office visit 08/25/21 acute ?Last refill 07/05/20 #60 ?Upcoming appointment 09/19/21 ?

## 2021-09-13 NOTE — Patient Instructions (Addendum)
Your MRI is scheduled for September 21, 2021 at 8 am (arrive by 7:45 am) at [facility]. Nothing to eat or drink 4 hours prior.  ? ?Your HIDA scan is scheduled for September 19, 2021 @ 10 am (arrive by 9:45 am) at [facility] Nothing to eat or drink 6 hours prior.  ? ? ?If you have any concerns or questions, please feel free to call our office. See follow up appointment below.  ? ?Liver Abscess ?A liver abscess is an infected area inside the liver that contains a collection of pus. The liver is a large organ in the upper right side of the abdomen. It has many important functions, including storing energy, producing fluids that the body needs, and removing harmful substances from the bloodstream. ?A liver abscess can be dangerous if not treated. ?What are the causes? ?This condition may be caused by: ?A bacterial infection. ?An infection with a parasite called an amoeba (Entamoeba histolytica). ?An infection with a fungus called Candida. This is rare. ?A liver abscess can occur: ?When infections spread to the liver from other parts of the abdomen, such as the appendix (appendicitis), the large intestine (diverticulitis), or the gallbladder (cholecystitis). ?When infections spread to the liver from other parts of the body through the bloodstream. ?After abdominal surgery or a penetrating injury to the abdomen. ?What increases the risk? ?You are more likely to develop this condition if you: ?Have diabetes. ?Have a liver disease (cirrhosis) in which healthy liver tissue is replaced by scar tissue. ?Have a weakened disease-fighting system (immune system). ?Take a specific type of medicine to reduce stomach acid (proton pump inhibitor). ?Are older. ?Are male. ?What are the signs or symptoms? ?Symptoms of this condition often come on slowly over many days or a few weeks. The most common symptoms are: ?Fever. ?Pain in the upper right side of the abdomen. ?Other symptoms include: ?A general ill feeling  (malaise). ?Chills. ?Nausea and vomiting. ?Loss of appetite. ?Diarrhea. ?Weight loss. ?How is this diagnosed? ?This condition may be diagnosed based on: ?Your symptoms, your medical history, and a physical exam. ?Blood tests to check for infections. ?Imaging tests, such as a CT scan or ultrasound. Imaging tests are often the best way to find an abscess. ?Needle aspiration. In this procedure, a long needle is put through the skin and into the liver. A sample of pus is drained out through the needle. The sample is studied under a microscope, and a culture test is done. ?How is this treated? ?Treatment for this condition depends on the cause of the infection. ?If the abscess was caused by bacteria, treatment usually involves having the pus drained and taking antibiotic medicines. ?You may need to be hospitalized and given antibiotics directly into a vein through an IV. ?The pus may be drained from the abscess through needle aspiration or by making an incision in the abscess. ?If the abscess was caused by an amoeba or a fungus, drainage of the abscess is usually not needed. Antimicrobial medicines are normally used to treat those infections. ?Follow these instructions at home: ?Medicines ?Take over-the-counter and prescription medicines only as told by your health care provider. ?If you were prescribed an antibiotic medicine, take it as told by your health care provider. Do not stop using the antibiotic even if you start to feel better. ?General instructions ? ?Rest as much as possible and get plenty of sleep. ?Avoid activities that take a lot of effort. ?Return to your normal activities as told by  your health care provider. Ask your health care provider what activities are safe for you. ?Do not lift anything that is heavier than 10 lb (4.5 kg), or the limit that you are told, until your health care provider says that it is safe. ?Do not drink alcohol until your health care provider approves. ?Keep all follow-up visits.  This is important. ?Contact a health care provider if: ?Your symptoms get worse, or you develop any of the following symptoms: ?Pain or swelling in your abdomen. ?Loss of appetite. ?Nausea or vomiting. ?Diarrhea. ?Fever, chills, or sweats. ?Your skin or the white parts of your eyes turn yellow (jaundice). ?Get help right away if: ?Your abdominal pain suddenly gets worse. ?You develop confusion. ?These symptoms may represent a serious problem that is an emergency. Do not wait to see if the symptoms will go away. Get medical help right away. Call your local emergency services (911 in the [place] Do not drive yourself to the hospital. ?Summary ?A liver abscess is an infected area inside the liver that contains a collection of pus. ?Liver abscesses often cause abdominal pain, fever, and other symptoms. A liver abscess can be dangerous if not treated. ?If the abscess was caused by bacteria, treatment usually involves having the pus drained and taking antibiotic medicines. ?If the abscess was caused by an amoeba or a fungus, drainage of the abscess is usually not needed. Antimicrobial medicines are normally used to treat those infections. ?Get help right away if your abdominal pain suddenly gets worse or you develop confusion. ?This information is not intended to replace advice given to you by your health care provider. Make sure you discuss any questions you have with your health care provider. ?Document Revised: 03/30/2020 Document Reviewed: 03/30/2020 ?Elsevier Patient Education ? [place]. ? ?

## 2021-09-14 MED ORDER — SILDENAFIL CITRATE 20 MG PO TABS: 60.0000 mg | ORAL_TABLET | Freq: Every day | ORAL | 6 refills | Status: DC | PRN

## 2021-09-14 NOTE — Addendum Note (Signed)
Addended by: Brenton Grills on: 1/64/3539 12:25 AM ? ? Modules accepted: Orders ? ?

## 2021-09-14 NOTE — Telephone Encounter (Signed)
This script sildenafil (REVATIO) 20 MG tablet was supposed to go to [facility] ? ?[id number] - [place], [place] - [place] Phone:  [phone]  ?Fax:  [phone]  ?  ? ?Patient needs it [facility] to [facility], states it is cheaper there ?

## 2021-09-14 NOTE — Telephone Encounter (Signed)
Sent refill to [facility] [address] ?

## 2021-09-15 ENCOUNTER — Encounter: Payer: Self-pay | Admitting: Surgery

## 2021-09-15 NOTE — Progress Notes (Signed)
Outpatient Surgical Follow Up ? ?09/15/2021 ? ?Mark Martinez is an 71 y.o. male.  ? ?Chief Complaint  ?Patient presents with  ? Follow-up  ? ? ?HPI: Mark Martinez is a 71 year old male well-known to me with a prior history of cholecystectomy over a year and 4  months ago to develop hepatic abscess a couple months ago treated with percutaneous drain and antibiotics. ?Overall he feels better but he continues to have intermittent right upper quadrant pain.  He is concerned that the abscess has recurred.  No fevers no chills he is tolerating diet.  He is walking. ?His developed covid and now has improved. ?Repeated CT pers reviewed showing improved in small perihepatic collection now measures ? ?I have personally reviewed the prior CT showing a collection on the GB fossa 3.6x 2.6 cms  ? ?Past Medical History:  ?Diagnosis Date  ? Cataract   ? COVID-19 virus infection 06/2019  ? ED (erectile dysfunction)   ? Elevated LFTs   ? Esophageal stricture   ? GERD (gastroesophageal reflux disease)   ? with hiatal hernia  ? H/O hiatal hernia   ? History of prostatitis   ? HLD (hyperlipidemia)   ? Prediabetes 07/28/2015  ? Shortness of breath   ? exersion  ? Tubular adenoma of colon   ? ? ?Past Surgical History:  ?Procedure Laterality Date  ? BACK SURGERY  1994  ? lumbar area  ? CHOLECYSTECTOMY    ? COLONOSCOPY  2005  ? WNL, rpt 10 yrs  ? COLONOSCOPY  07/2019  ? 1 TA, hemorrhoids, rpt 7 yrs [name])  ? CYST REMOVAL NECK Left 08/31/2013  ? Procedure: EXCISION EPIDERMAL INCLUSION CYST LEFT NECK;  Surgeon: Zenovia Jarred, MD  ? ESOPHAGEAL DILATION    ? three last  ? ESOPHAGOGASTRODUODENOSCOPY  12-27-1996  ? hiatal hernia (Dr. Sharlett Iles)  ? SHOULDER ARTHROSCOPY WITH ROTATOR CUFF REPAIR AND SUBACROMIAL DECOMPRESSION Right 01/2020  ? distal clavicle resection, RTC repair ([facility])  ? ? ?Family History  ?Problem Relation Age of Onset  ? Valvular heart disease Mother   ? Ulcers Father   ? CAD Father 34  ?     multiple stents  ? CAD Paternal Uncle   ?  Cancer Neg Hx   ? Diabetes Neg Hx   ? Stroke Neg Hx   ? Colon cancer Neg Hx   ? Esophageal cancer Neg Hx   ? Pancreatic cancer Neg Hx   ? Liver disease Neg Hx   ? Stomach cancer Neg Hx   ? ? ?Social History:  reports that he has never smoked. He has never been exposed to tobacco smoke. He quit smokeless tobacco use about 12 years ago. He reports that he does not drink alcohol and does not use drugs. ? ?Allergies:  ?Allergies  ?Allergen Reactions  ? Codeine Other (See Comments)  ?  Keeps him awake  ? ? ?Medications reviewed. ? ? ? ?ROS ?Full ROS performed and is otherwise negative other than what is stated in HPI ? ? ?BP 112/76   Pulse 60   Temp 98.2 ?F (36.8 ?C)   Ht 6' (1.829 m)   Wt 208 lb (94.3 kg)   SpO2 92%   BMI 28.21 kg/m?  ? ?Physical Exam ?Vitals and nursing note reviewed. Exam conducted with a chaperone present.  ?Constitutional:   ?   General: He is not in acute distress. ?   Appearance: Normal appearance. He is not ill-appearing or toxic-appearing.  ?Eyes:  ?  General: No scleral icterus.    ?   Right eye: No discharge.     ?   Left eye: No discharge.  ?Neck:  ?   Vascular: No carotid bruit.  ?Cardiovascular:  ?   Rate and Rhythm: Normal rate and regular rhythm.  ?   Heart sounds: No murmur heard. ?Pulmonary:  ?   Effort: Pulmonary effort is normal. No respiratory distress.  ?   Breath sounds: Normal breath sounds. No stridor. No wheezing or rhonchi.  ?Abdominal:  ?   General: Abdomen is flat. There is no distension.  ?   Palpations: Abdomen is soft. There is no mass.  ?   Tenderness: There is no abdominal tenderness. There is no guarding.  ?   Hernia: No hernia is present.  ?Musculoskeletal:  ?   Cervical back: Normal range of motion and neck supple. No rigidity or tenderness.  ?Skin: ?   General: Skin is warm.  ?   Capillary Refill: Capillary refill takes less than 2 seconds.  ?   Coloration: Skin is not jaundiced.  ?Neurological:  ?   General: No focal deficit present.  ?   Mental Status: He is  alert and oriented to person, place, and time.  ?Psychiatric:     ?   Mood and Affect: Mood normal.     ?   Behavior: Behavior normal.     ?   Thought Content: Thought content normal.     ?   Judgment: Judgment normal.  ? ? ?Assessment/Plan: ?Liver abscess w small persistent collection, no easily accessible by percutaneous approach. I will like to interrogate a potential bile leak as a source. Note than it has been > 1 year since surgery. Also given some potential concerning findings on prior MRI I would like to repeat it to make sure we are not dealing w a potential neoplastic process. ?Note that I spent > 40 min in this encounter including pers. Reviewing images, coordinating his care, placing orders, counseling the pt and performing appropriate documentation ? ? ?Caroleen Hamman, MD FACS ?General Surgeon  ?

## 2021-09-16 ENCOUNTER — Other Ambulatory Visit: Payer: Self-pay | Admitting: Nurse Practitioner

## 2021-09-16 DIAGNOSIS — R0689 Other abnormalities of breathing: Secondary | ICD-10-CM

## 2021-09-19 ENCOUNTER — Ambulatory Visit
Admission: RE | Admit: 2021-09-19 | Discharge: 2021-09-19 | Disposition: A | Discharge status: Home / Self Care | Payer: Medicare HMO | Source: Ambulatory Visit | Attending: Surgery | Admitting: Surgery

## 2021-09-19 ENCOUNTER — Ambulatory Visit (INDEPENDENT_AMBULATORY_CARE_PROVIDER_SITE_OTHER): Payer: Medicare HMO | Admitting: Family Medicine

## 2021-09-19 ENCOUNTER — Encounter: Payer: Self-pay | Admitting: Family Medicine

## 2021-09-19 VITALS — BP 118/82 | HR 62 | Temp 97.9°F | Ht 72.0 in | Wt 208.4 lb

## 2021-09-19 DIAGNOSIS — R053 Chronic cough: Secondary | ICD-10-CM

## 2021-09-19 DIAGNOSIS — Z9049 Acquired absence of other specified parts of digestive tract: Secondary | ICD-10-CM

## 2021-09-19 DIAGNOSIS — R748 Abnormal levels of other serum enzymes: Secondary | ICD-10-CM

## 2021-09-19 DIAGNOSIS — K750 Abscess of liver: Secondary | ICD-10-CM | POA: Diagnosis not present

## 2021-09-19 DIAGNOSIS — K219 Gastro-esophageal reflux disease without esophagitis: Secondary | ICD-10-CM | POA: Diagnosis not present

## 2021-09-19 MED ORDER — TECHNETIUM TC 99M MEBROFENIN IV KIT
5.0500 | PACK | Freq: Once | INTRAVENOUS | Status: AC | PRN
  Administered 2021-09-19: 5.05 via INTRAVENOUS

## 2021-09-19 NOTE — Patient Instructions (Addendum)
Good to see you today.  ?No spirometry today.  ?Will await GI work up first.  ? ?

## 2021-09-19 NOTE — Progress Notes (Signed)
? ? Patient ID: Mark Martinez, male    DOB: [birth year], 71 y.o.   MRN: [id number] ? ?This visit was conducted in person. ? ?BP 118/82   Pulse 62   Temp 97.9 ?F (36.6 ?C) (Temporal)   Ht 6' (1.829 m)   Wt 208 lb 6 oz (94.5 kg)   SpO2 94%   BMI 28.26 kg/m?   ? ?CC: f/u visit  ?Subjective:  ? ?HPI: ?Mark Martinez is a 71 y.o. male presenting on 09/19/2021 for Follow-up (Here for f/u due to recent liver lab results.  Pt accompanied by wife, Enid Derry. ) ? ? ?See prior notes for details.  ?S/p IR drainage of perihepatic abscess.  ?CT from last week showing recurrent fluid collection measuring 3.6x2.6cm. previously noted pleural effusion had decreased.  ? ?Notes ongoing RUQ abd discomfort but without fevers/chills, nausea/ diarhrea.  ? ?Saw surgeon [name] last week - latest imaging showed small persistent collection, not easily accessible by IR. ?potential bile leak source. Planning rpt MRI for further evaluation later this week as well as HIDA scan later today.  ? ?Intermittent chronic cough for months s/p several rounds of antibiotics most recently azithromycin and prednisone last month. CXR showed R pleural effusion.  ?CT chest from late last year showed bi-apical scarring.  ?Non smoker.  ? ?S/p robotic cholecystectomy 04/2020.  ?   ? ?Relevant past medical, surgical, family and social history reviewed and updated as indicated. Interim medical history since our last visit reviewed. ?Allergies and medications reviewed and updated. ?Outpatient Medications Prior to Visit  ?Medication Sig Dispense Refill  ? acetaminophen (TYLENOL) 325 MG tablet Take 650 mg by mouth every 6 (six) hours as needed for moderate pain.     ? albuterol (VENTOLIN HFA) 108 (90 Base) MCG/ACT inhaler Inhale into the lungs.    ? cholestyramine (QUESTRAN) 4 g packet Take 4 g by mouth 2 (two) times daily.    ? esomeprazole (NEXIUM) 40 MG capsule Take 1 capsule (40 mg total) by mouth daily at 12 noon. 90 capsule 3  ? Misc Natural Products (OSTEO  BI-FLEX JOINT SHIELD PO) Take by mouth daily.    ? POTASSIUM PO Take by mouth daily.    ? sildenafil (REVATIO) 20 MG tablet Take 3-5 tablets (60-100 mg total) by mouth daily as needed (relations). 60 tablet 6  ? cetirizine (ZYRTEC) 10 MG tablet Take 1 tablet (10 mg total) by mouth daily. (Patient not taking: Reported on 09/19/2021) 30 tablet 0  ? furosemide (LASIX) 20 MG tablet Take 1 tablet (20 mg total) by mouth daily for 5 days. 5 tablet 0  ? ?No facility-administered medications prior to visit.  ?  ? ?Per HPI unless specifically indicated in ROS section below ?Review of Systems ? ?Objective:  ?BP 118/82   Pulse 62   Temp 97.9 ?F (36.6 ?C) (Temporal)   Ht 6' (1.829 m)   Wt 208 lb 6 oz (94.5 kg)   SpO2 94%   BMI 28.26 kg/m?   ?Wt Readings from Last 3 Encounters:  ?09/19/21 208 lb 6 oz (94.5 kg)  ?09/13/21 208 lb (94.3 kg)  ?08/25/21 215 lb 6 oz (97.7 kg)  ?  ?  ?Physical Exam ?Vitals and nursing note reviewed.  ?Constitutional:   ?   Appearance: Normal appearance. He is not ill-appearing.  ?Eyes:  ?   Extraocular Movements: Extraocular movements intact.  ?   Pupils: Pupils are equal, round, and reactive to light.  ?Cardiovascular:  ?  Rate and Rhythm: Normal rate and regular rhythm.  ?   Pulses: Normal pulses.  ?   Heart sounds: Normal heart sounds. No murmur heard. ?Pulmonary:  ?   Effort: Pulmonary effort is normal. No respiratory distress.  ?   Breath sounds: Normal breath sounds. No wheezing, rhonchi or rales.  ?   Comments: Lungs largely clear including RLL ?Abdominal:  ?   General: Bowel sounds are normal. There is no distension.  ?   Palpations: Abdomen is soft. There is no mass.  ?   Tenderness: There is no abdominal tenderness. There is no right CVA tenderness, left CVA tenderness, guarding or rebound.  ?   Hernia: No hernia is present.  ?Skin: ?   General: Skin is warm and dry.  ?   Findings: No rash.  ?Neurological:  ?   Mental Status: He is alert.  ?Psychiatric:     ?   Mood and Affect: Mood  normal.     ?   Behavior: Behavior normal.  ? ?   ?Results for orders placed or performed in visit on 09/04/21  ?Comprehensive metabolic panel  ?Result Value Ref Range  ? Sodium 137 135 - 145 mEq/L  ? Potassium 4.9 3.5 - 5.1 mEq/L  ? Chloride 100 96 - 112 mEq/L  ? CO2 32 19 - 32 mEq/L  ? Glucose, Bld 86 70 - 99 mg/dL  ? BUN 16 6 - 23 mg/dL  ? Creatinine, Ser 1.16 0.40 - 1.50 mg/dL  ? Total Bilirubin 0.6 0.2 - 1.2 mg/dL  ? Alkaline Phosphatase 141 (H) 39 - 117 U/L  ? AST 17 0 - 37 U/L  ? ALT 21 0 - 53 U/L  ? Total Protein 6.6 6.0 - 8.3 g/dL  ? Albumin 3.8 3.5 - 5.2 g/dL  ? GFR 64.33 >60.00 mL/min  ? Calcium 9.1 8.4 - 10.5 mg/dL  ?CBC with Differential/Platelet  ?Result Value Ref Range  ? WBC 7.9 4.0 - 10.5 K/uL  ? RBC 5.32 4.22 - 5.81 Mil/uL  ? Hemoglobin 15.9 13.0 - 17.0 g/dL  ? HCT 48.5 39.0 - 52.0 %  ? MCV 91.3 78.0 - 100.0 fl  ? MCHC 32.8 30.0 - 36.0 g/dL  ? RDW 16.1 (H) 11.5 - 15.5 %  ? Platelets 296.0 150.0 - 400.0 K/uL  ? Neutrophils Relative % 65.6 43.0 - 77.0 %  ? Lymphocytes Relative 20.7 12.0 - 46.0 %  ? Monocytes Relative 9.0 3.0 - 12.0 %  ? Eosinophils Relative 4.1 0.0 - 5.0 %  ? Basophils Relative 0.6 0.0 - 3.0 %  ? Neutro Abs 5.2 1.4 - 7.7 K/uL  ? Lymphs Abs 1.6 0.7 - 4.0 K/uL  ? Monocytes Absolute 0.7 0.1 - 1.0 K/uL  ? Eosinophils Absolute 0.3 0.0 - 0.7 K/uL  ? Basophils Absolute 0.0 0.0 - 0.1 K/uL  ?Sedimentation rate  ?Result Value Ref Range  ? Sed Rate 29 (H) 0 - 20 mm/hr  ? ? ?Assessment & Plan:  ? ?Problem List Items Addressed This Visit   ? ? Chronic cough  ?  Resolved after recent zpack/prednisone course.  ?Recent CXR showed R pleural effusion, CT showed significant improvement.  ?Cough lingers for months each time he gets bronchitis, seems to improve only after abx/steroid course.  ?I had brought him in for spirometry however our spirometry machine is broken, pending new replacement.  ?Will defer further pulmonary evaluation until after GI workup completed. Pt agrees with plan.  ?  ?  ?  S/P  laparoscopic cholecystectomy  ? Elevated alkaline phosphatase level  ?  Persistently elevated ALP albeit improving. ?  ?  ? Liver abscess - Primary  ?  Ongoing recurrent liver abscess/fluid collection pending further imaging including MRI r/o bile leak. Appreciate gen surgery care.  ?  ?  ?  ? ?No orders of the defined types were placed in this encounter. ? ?No orders of the defined types were placed in this encounter. ? ? ? ?Patient Instructions  ?Good to see you today.  ?No spirometry today.  ?Will await GI work up first.  ? ? ?Follow up plan: ?Return if symptoms worsen or fail to improve. ? ?Ria Bush, MD   ?

## 2021-09-19 NOTE — Assessment & Plan Note (Signed)
Ongoing recurrent liver abscess/fluid collection pending further imaging including MRI r/o bile leak. Appreciate gen surgery care.  ?

## 2021-09-19 NOTE — Assessment & Plan Note (Addendum)
Persistently elevated ALP albeit improving. ?

## 2021-09-19 NOTE — Assessment & Plan Note (Addendum)
Resolved after recent zpack/prednisone course.  ?Recent CXR showed R pleural effusion, CT showed significant improvement.  ?Cough lingers for months each time he gets bronchitis, seems to improve only after abx/steroid course.  ?I had brought him in for spirometry however our spirometry machine is broken, pending new replacement.  ?Will defer further pulmonary evaluation until after GI workup completed. Pt agrees with plan.  ?

## 2021-09-20 ENCOUNTER — Telehealth: Payer: Self-pay

## 2021-09-20 NOTE — Telephone Encounter (Signed)
Notified patient as instructed, message left with the results.  ?

## 2021-09-20 NOTE — Telephone Encounter (Signed)
-----   Message from Jules Husbands, MD sent at 09/20/2021  9:56 AM EDT ----- ?Please let him know HIDA did not show any bile leaks from his GB surgery ?----- Message ----- ?From: Interface, Rad Results In ?Sent: 09/19/2021  11:11 AM EDT ?To: Jules Husbands, MD ? ? ?

## 2021-09-21 ENCOUNTER — Telehealth: Payer: Self-pay

## 2021-09-21 ENCOUNTER — Ambulatory Visit
Admission: RE | Admit: 2021-09-21 | Discharge: 2021-09-21 | Disposition: A | Discharge status: Home / Self Care | Payer: Medicare HMO | Source: Ambulatory Visit | Attending: Surgery | Admitting: Surgery

## 2021-09-21 DIAGNOSIS — K750 Abscess of liver: Secondary | ICD-10-CM | POA: Insufficient documentation

## 2021-09-21 DIAGNOSIS — K449 Diaphragmatic hernia without obstruction or gangrene: Secondary | ICD-10-CM | POA: Diagnosis not present

## 2021-09-21 DIAGNOSIS — M439 Deforming dorsopathy, unspecified: Secondary | ICD-10-CM | POA: Diagnosis not present

## 2021-09-21 DIAGNOSIS — K7689 Other specified diseases of liver: Secondary | ICD-10-CM | POA: Diagnosis not present

## 2021-09-21 DIAGNOSIS — K828 Other specified diseases of gallbladder: Secondary | ICD-10-CM | POA: Diagnosis not present

## 2021-09-21 MED ORDER — GADOBUTROL 1 MMOL/ML IV SOLN
9.0000 mL | Freq: Once | INTRAVENOUS | Status: AC | PRN
  Administered 2021-09-21: 9 mL via INTRAVENOUS

## 2021-09-21 NOTE — Telephone Encounter (Signed)
This does not need to be filled out as insurance will not cover sildenafil '20mg'$  for ED.  ?He will have to pay out of pocket for this. ?But hopefully out of pocket is cheaper than 50-'100mg'$  viagra dose with insurance.  ?

## 2021-09-21 NOTE — Telephone Encounter (Signed)
Received faxed PA form from [facility] for sildenafil 20 mg tab.  Placed form in Dr. Synthia Innocent box.  ?

## 2021-09-25 NOTE — Telephone Encounter (Signed)
Patient notified as instructed by telephone and verbalized understanding. Patient stated that he does not know why a PA was requested because he has been paying out of pocket for the medication. ?

## 2021-10-02 ENCOUNTER — Encounter: Payer: Self-pay | Admitting: Surgery

## 2021-10-02 ENCOUNTER — Ambulatory Visit: Payer: Medicare HMO | Admitting: Surgery

## 2021-10-02 VITALS — BP 112/79 | HR 61 | Temp 98.2°F | Ht 72.0 in | Wt 210.0 lb

## 2021-10-02 DIAGNOSIS — K750 Abscess of liver: Secondary | ICD-10-CM

## 2021-10-02 NOTE — Patient Instructions (Addendum)
We are going to continue to watch the area found on MRI.  ? ?We would like to have you return in 2 months for a follow up. We will talk about any next steps at that point.  ? ? ?Call us if you start to develop any problems with increasing abdominal pain, nausea & vomiting.  ?

## 2021-10-03 NOTE — Progress Notes (Signed)
Outpatient Surgical Follow Up ? ?10/03/2021 ? ?Mark Martinez is an 71 y.o. male.  ? ?Chief Complaint  ?Patient presents with  ? Follow-up  ? ? ?HPI:  ?Mark Martinez is a 71 year old male well-known to me with a prior history of cholecystectomy over a year and 5  months ago to develop hepatic abscess a few months ago treated with percutaneous drain and antibiotics. ? ?Out of abundance of caution we perform an extensive work-up to include a HIDA scan and an MRI.  Please note that I have personally reviewed those images and compare them with prior imaging studies the good news is that there is no evidence of neoplastic process in the liver and there is just one a small collection next to the gallbladder fossa.  It is not easily accessible for percutaneous drainage and is not a clear-cut abscess. ?There is no evidence of bile leak o or any other concerning pathology ?Overall he feels better but he continues to have intermittent right upper quadrant pain.   ? No fevers no chills he is tolerating diet.  He is walking. ? ? ?  ?Past Medical History:  ?Diagnosis Date  ? Cataract   ? COVID-19 virus infection 06/2019  ? ED (erectile dysfunction)   ? Elevated LFTs   ? Esophageal stricture   ? GERD (gastroesophageal reflux disease)   ? with hiatal hernia  ? H/O hiatal hernia   ? History of prostatitis   ? HLD (hyperlipidemia)   ? Prediabetes 07/28/2015  ? Shortness of breath   ? exersion  ? Tubular adenoma of colon   ? ? ?Past Surgical History:  ?Procedure Laterality Date  ? BACK SURGERY  1994  ? lumbar area  ? CHOLECYSTECTOMY    ? COLONOSCOPY  2005  ? WNL, rpt 10 yrs  ? COLONOSCOPY  07/2019  ? 1 TA, hemorrhoids, rpt 7 yrs Mark Martinez)  ? CYST REMOVAL NECK Left 08/31/2013  ? Procedure: EXCISION EPIDERMAL INCLUSION CYST LEFT NECK;  Surgeon: Zenovia Jarred, MD  ? ESOPHAGEAL DILATION    ? three last  ? ESOPHAGOGASTRODUODENOSCOPY  12-27-1996  ? hiatal hernia (Dr. Sharlett Iles)  ? SHOULDER ARTHROSCOPY WITH ROTATOR CUFF REPAIR AND SUBACROMIAL  DECOMPRESSION Right 01/2020  ? distal clavicle resection, RTC repair (Supple)  ? ? ?Family History  ?Problem Relation Age of Onset  ? Valvular heart disease Mother   ? Ulcers Father   ? CAD Father 74  ?     multiple stents  ? CAD Paternal Uncle   ? Cancer Neg Hx   ? Diabetes Neg Hx   ? Stroke Neg Hx   ? Colon cancer Neg Hx   ? Esophageal cancer Neg Hx   ? Pancreatic cancer Neg Hx   ? Liver disease Neg Hx   ? Stomach cancer Neg Hx   ? ? ?Social History:  reports that he has never smoked. He has never been exposed to tobacco smoke. He quit smokeless tobacco use about 12 years ago. He reports that he does not drink alcohol and does not use drugs. ? ?Allergies:  ?Allergies  ?Allergen Reactions  ? Codeine Other (See Comments)  ?  Keeps him awake  ? ? ?Medications reviewed. ? ? ? ?ROS ?Full ROS performed and is otherwise negative other than what is stated in HPI ? ? ?BP 112/79   Pulse 61   Temp 98.2 ?F (36.8 ?C)   Ht 6' (1.829 m)   Wt 210 lb (95.3 kg)   SpO2 92%  BMI 28.48 kg/m?  ? ?Physical Exam ?Vitals and nursing note reviewed. Exam conducted with a chaperone present.  ?Constitutional:   ?   General: He is not in acute distress. ?   Appearance: Normal appearance.  ?Eyes:  ?   General: No scleral icterus.    ?   Right eye: No discharge.     ?   Left eye: No discharge.  ?Cardiovascular:  ?   Rate and Rhythm: Normal rate and regular rhythm.  ?   Heart sounds: No murmur heard. ?  No friction rub.  ?Pulmonary:  ?   Effort: Pulmonary effort is normal. No respiratory distress.  ?   Breath sounds: Normal breath sounds. No stridor.  ?Abdominal:  ?   General: Abdomen is flat. There is no distension.  ?   Palpations: Abdomen is soft. There is no mass.  ?   Tenderness: There is no abdominal tenderness. There is no guarding or rebound.  ?   Hernia: No hernia is present.  ?Musculoskeletal:     ?   General: No swelling or tenderness. Normal range of motion.  ?   Cervical back: Normal range of motion and neck supple. No  rigidity or tenderness.  ?Skin: ?   General: Skin is warm and dry.  ?   Capillary Refill: Capillary refill takes less than 2 seconds.  ?Neurological:  ?   General: No focal deficit present.  ?   Mental Status: He is alert and oriented to person, place, and time.  ?Psychiatric:     ?   Mood and Affect: Mood normal.     ?   Behavior: Behavior normal.     ?   Thought Content: Thought content normal.     ?   Judgment: Judgment normal.  ? ? ? ? ? ?Assessment/Plan: ?Resolving Liver abscess.Its etiology to me still not clear.  He is a year and a half out from cholecystectomy and work-up has failed to reveal evidence of a biliary leak or direct complication from gallbladder surgery.  More than likely he did not have evidence of a colonic infection in the form from colitis to diverticulitis that caused an abscess. ?I had an extensive discussion with the patient regarding his disease process.  At this time given that his abscess has almost disappear I would not do further treatment with antibiotics or percutaneous drainage.  Discussed the possibility of performing a CT scan in a few months although I am not sure if this will help [place] anymore and I will like to avoid any more radiation exposure.  He is in agreement with me we will do a clinical follow-up in a couple months and reassess the need for further images.  It is reassuring that he does not have a neoplastic process or more complex process that would require any intervention at this time. ?Note that I spent > 40 min in this encounter including pers. Reviewing images, coordinating his care, placing orders, counseling the pt and performing appropriate documentation ?  ? ?Caroleen Hamman, MD FACS ?General Surgeon  ?

## 2021-10-04 DIAGNOSIS — R69 Illness, unspecified: Secondary | ICD-10-CM | POA: Diagnosis not present

## 2021-10-13 DIAGNOSIS — M5416 Radiculopathy, lumbar region: Secondary | ICD-10-CM | POA: Diagnosis not present

## 2021-10-13 DIAGNOSIS — M9905 Segmental and somatic dysfunction of pelvic region: Secondary | ICD-10-CM | POA: Diagnosis not present

## 2021-10-13 DIAGNOSIS — M9903 Segmental and somatic dysfunction of lumbar region: Secondary | ICD-10-CM | POA: Diagnosis not present

## 2021-10-13 DIAGNOSIS — M5136 Other intervertebral disc degeneration, lumbar region: Secondary | ICD-10-CM | POA: Diagnosis not present

## 2021-10-16 DIAGNOSIS — M5136 Other intervertebral disc degeneration, lumbar region: Secondary | ICD-10-CM | POA: Diagnosis not present

## 2021-10-16 DIAGNOSIS — M5416 Radiculopathy, lumbar region: Secondary | ICD-10-CM | POA: Diagnosis not present

## 2021-10-16 DIAGNOSIS — M9903 Segmental and somatic dysfunction of lumbar region: Secondary | ICD-10-CM | POA: Diagnosis not present

## 2021-10-16 DIAGNOSIS — M9905 Segmental and somatic dysfunction of pelvic region: Secondary | ICD-10-CM | POA: Diagnosis not present

## 2021-10-17 ENCOUNTER — Other Ambulatory Visit: Payer: Medicare HMO

## 2021-10-17 ENCOUNTER — Ambulatory Visit: Payer: Medicare HMO

## 2021-10-18 DIAGNOSIS — M9905 Segmental and somatic dysfunction of pelvic region: Secondary | ICD-10-CM | POA: Diagnosis not present

## 2021-10-18 DIAGNOSIS — M9903 Segmental and somatic dysfunction of lumbar region: Secondary | ICD-10-CM | POA: Diagnosis not present

## 2021-10-18 DIAGNOSIS — M5136 Other intervertebral disc degeneration, lumbar region: Secondary | ICD-10-CM | POA: Diagnosis not present

## 2021-10-18 DIAGNOSIS — M5416 Radiculopathy, lumbar region: Secondary | ICD-10-CM | POA: Diagnosis not present

## 2021-10-19 DIAGNOSIS — M5416 Radiculopathy, lumbar region: Secondary | ICD-10-CM | POA: Diagnosis not present

## 2021-10-19 DIAGNOSIS — M9905 Segmental and somatic dysfunction of pelvic region: Secondary | ICD-10-CM | POA: Diagnosis not present

## 2021-10-19 DIAGNOSIS — M9903 Segmental and somatic dysfunction of lumbar region: Secondary | ICD-10-CM | POA: Diagnosis not present

## 2021-10-19 DIAGNOSIS — M5136 Other intervertebral disc degeneration, lumbar region: Secondary | ICD-10-CM | POA: Diagnosis not present

## 2021-10-23 DIAGNOSIS — M5416 Radiculopathy, lumbar region: Secondary | ICD-10-CM | POA: Diagnosis not present

## 2021-10-23 DIAGNOSIS — M9903 Segmental and somatic dysfunction of lumbar region: Secondary | ICD-10-CM | POA: Diagnosis not present

## 2021-10-23 DIAGNOSIS — M9905 Segmental and somatic dysfunction of pelvic region: Secondary | ICD-10-CM | POA: Diagnosis not present

## 2021-10-23 DIAGNOSIS — M5136 Other intervertebral disc degeneration, lumbar region: Secondary | ICD-10-CM | POA: Diagnosis not present

## 2021-10-25 DIAGNOSIS — M9905 Segmental and somatic dysfunction of pelvic region: Secondary | ICD-10-CM | POA: Diagnosis not present

## 2021-10-25 DIAGNOSIS — M9903 Segmental and somatic dysfunction of lumbar region: Secondary | ICD-10-CM | POA: Diagnosis not present

## 2021-10-25 DIAGNOSIS — M5416 Radiculopathy, lumbar region: Secondary | ICD-10-CM | POA: Diagnosis not present

## 2021-10-25 DIAGNOSIS — M5136 Other intervertebral disc degeneration, lumbar region: Secondary | ICD-10-CM | POA: Diagnosis not present

## 2021-10-26 DIAGNOSIS — M5136 Other intervertebral disc degeneration, lumbar region: Secondary | ICD-10-CM | POA: Diagnosis not present

## 2021-10-26 DIAGNOSIS — M5416 Radiculopathy, lumbar region: Secondary | ICD-10-CM | POA: Diagnosis not present

## 2021-10-26 DIAGNOSIS — M9905 Segmental and somatic dysfunction of pelvic region: Secondary | ICD-10-CM | POA: Diagnosis not present

## 2021-10-26 DIAGNOSIS — M9903 Segmental and somatic dysfunction of lumbar region: Secondary | ICD-10-CM | POA: Diagnosis not present

## 2021-10-30 DIAGNOSIS — M5416 Radiculopathy, lumbar region: Secondary | ICD-10-CM | POA: Diagnosis not present

## 2021-10-30 DIAGNOSIS — M9905 Segmental and somatic dysfunction of pelvic region: Secondary | ICD-10-CM | POA: Diagnosis not present

## 2021-10-30 DIAGNOSIS — M9903 Segmental and somatic dysfunction of lumbar region: Secondary | ICD-10-CM | POA: Diagnosis not present

## 2021-10-30 DIAGNOSIS — M5136 Other intervertebral disc degeneration, lumbar region: Secondary | ICD-10-CM | POA: Diagnosis not present

## 2021-11-01 DIAGNOSIS — M9905 Segmental and somatic dysfunction of pelvic region: Secondary | ICD-10-CM | POA: Diagnosis not present

## 2021-11-01 DIAGNOSIS — M5136 Other intervertebral disc degeneration, lumbar region: Secondary | ICD-10-CM | POA: Diagnosis not present

## 2021-11-01 DIAGNOSIS — M9903 Segmental and somatic dysfunction of lumbar region: Secondary | ICD-10-CM | POA: Diagnosis not present

## 2021-11-01 DIAGNOSIS — M5416 Radiculopathy, lumbar region: Secondary | ICD-10-CM | POA: Diagnosis not present

## 2021-11-02 DIAGNOSIS — M9903 Segmental and somatic dysfunction of lumbar region: Secondary | ICD-10-CM | POA: Diagnosis not present

## 2021-11-02 DIAGNOSIS — M9905 Segmental and somatic dysfunction of pelvic region: Secondary | ICD-10-CM | POA: Diagnosis not present

## 2021-11-02 DIAGNOSIS — M5136 Other intervertebral disc degeneration, lumbar region: Secondary | ICD-10-CM | POA: Diagnosis not present

## 2021-11-02 DIAGNOSIS — M5416 Radiculopathy, lumbar region: Secondary | ICD-10-CM | POA: Diagnosis not present

## 2021-11-06 DIAGNOSIS — M5416 Radiculopathy, lumbar region: Secondary | ICD-10-CM | POA: Diagnosis not present

## 2021-11-06 DIAGNOSIS — M9905 Segmental and somatic dysfunction of pelvic region: Secondary | ICD-10-CM | POA: Diagnosis not present

## 2021-11-06 DIAGNOSIS — M9903 Segmental and somatic dysfunction of lumbar region: Secondary | ICD-10-CM | POA: Diagnosis not present

## 2021-11-06 DIAGNOSIS — M5136 Other intervertebral disc degeneration, lumbar region: Secondary | ICD-10-CM | POA: Diagnosis not present

## 2021-11-08 DIAGNOSIS — M5136 Other intervertebral disc degeneration, lumbar region: Secondary | ICD-10-CM | POA: Diagnosis not present

## 2021-11-08 DIAGNOSIS — M9903 Segmental and somatic dysfunction of lumbar region: Secondary | ICD-10-CM | POA: Diagnosis not present

## 2021-11-08 DIAGNOSIS — M9905 Segmental and somatic dysfunction of pelvic region: Secondary | ICD-10-CM | POA: Diagnosis not present

## 2021-11-08 DIAGNOSIS — M5416 Radiculopathy, lumbar region: Secondary | ICD-10-CM | POA: Diagnosis not present

## 2021-11-10 DIAGNOSIS — M9903 Segmental and somatic dysfunction of lumbar region: Secondary | ICD-10-CM | POA: Diagnosis not present

## 2021-11-10 DIAGNOSIS — M9905 Segmental and somatic dysfunction of pelvic region: Secondary | ICD-10-CM | POA: Diagnosis not present

## 2021-11-10 DIAGNOSIS — M5416 Radiculopathy, lumbar region: Secondary | ICD-10-CM | POA: Diagnosis not present

## 2021-11-10 DIAGNOSIS — M5136 Other intervertebral disc degeneration, lumbar region: Secondary | ICD-10-CM | POA: Diagnosis not present

## 2021-11-15 DIAGNOSIS — M9905 Segmental and somatic dysfunction of pelvic region: Secondary | ICD-10-CM | POA: Diagnosis not present

## 2021-11-15 DIAGNOSIS — M5136 Other intervertebral disc degeneration, lumbar region: Secondary | ICD-10-CM | POA: Diagnosis not present

## 2021-11-15 DIAGNOSIS — M9903 Segmental and somatic dysfunction of lumbar region: Secondary | ICD-10-CM | POA: Diagnosis not present

## 2021-11-15 DIAGNOSIS — M5416 Radiculopathy, lumbar region: Secondary | ICD-10-CM | POA: Diagnosis not present

## 2021-11-17 DIAGNOSIS — M5136 Other intervertebral disc degeneration, lumbar region: Secondary | ICD-10-CM | POA: Diagnosis not present

## 2021-11-17 DIAGNOSIS — M9905 Segmental and somatic dysfunction of pelvic region: Secondary | ICD-10-CM | POA: Diagnosis not present

## 2021-11-17 DIAGNOSIS — M5416 Radiculopathy, lumbar region: Secondary | ICD-10-CM | POA: Diagnosis not present

## 2021-11-17 DIAGNOSIS — M9903 Segmental and somatic dysfunction of lumbar region: Secondary | ICD-10-CM | POA: Diagnosis not present

## 2021-11-21 DIAGNOSIS — M9905 Segmental and somatic dysfunction of pelvic region: Secondary | ICD-10-CM | POA: Diagnosis not present

## 2021-11-21 DIAGNOSIS — M9903 Segmental and somatic dysfunction of lumbar region: Secondary | ICD-10-CM | POA: Diagnosis not present

## 2021-11-21 DIAGNOSIS — M5136 Other intervertebral disc degeneration, lumbar region: Secondary | ICD-10-CM | POA: Diagnosis not present

## 2021-11-21 DIAGNOSIS — M5416 Radiculopathy, lumbar region: Secondary | ICD-10-CM | POA: Diagnosis not present

## 2021-11-23 DIAGNOSIS — M5136 Other intervertebral disc degeneration, lumbar region: Secondary | ICD-10-CM | POA: Diagnosis not present

## 2021-11-23 DIAGNOSIS — M9905 Segmental and somatic dysfunction of pelvic region: Secondary | ICD-10-CM | POA: Diagnosis not present

## 2021-11-23 DIAGNOSIS — M9903 Segmental and somatic dysfunction of lumbar region: Secondary | ICD-10-CM | POA: Diagnosis not present

## 2021-11-23 DIAGNOSIS — M5416 Radiculopathy, lumbar region: Secondary | ICD-10-CM | POA: Diagnosis not present

## 2021-11-28 DIAGNOSIS — M9905 Segmental and somatic dysfunction of pelvic region: Secondary | ICD-10-CM | POA: Diagnosis not present

## 2021-11-28 DIAGNOSIS — M9903 Segmental and somatic dysfunction of lumbar region: Secondary | ICD-10-CM | POA: Diagnosis not present

## 2021-11-28 DIAGNOSIS — M5416 Radiculopathy, lumbar region: Secondary | ICD-10-CM | POA: Diagnosis not present

## 2021-11-28 DIAGNOSIS — M5136 Other intervertebral disc degeneration, lumbar region: Secondary | ICD-10-CM | POA: Diagnosis not present

## 2021-11-30 DIAGNOSIS — M9903 Segmental and somatic dysfunction of lumbar region: Secondary | ICD-10-CM | POA: Diagnosis not present

## 2021-11-30 DIAGNOSIS — M5136 Other intervertebral disc degeneration, lumbar region: Secondary | ICD-10-CM | POA: Diagnosis not present

## 2021-11-30 DIAGNOSIS — M5416 Radiculopathy, lumbar region: Secondary | ICD-10-CM | POA: Diagnosis not present

## 2021-11-30 DIAGNOSIS — M9905 Segmental and somatic dysfunction of pelvic region: Secondary | ICD-10-CM | POA: Diagnosis not present

## 2021-12-04 ENCOUNTER — Ambulatory Visit: Payer: Medicare HMO | Admitting: Surgery

## 2021-12-04 ENCOUNTER — Other Ambulatory Visit
Admission: RE | Admit: 2021-12-04 | Discharge: 2021-12-04 | Disposition: A | Discharge status: Home / Self Care | Payer: Medicare HMO | Attending: Surgery | Admitting: Surgery

## 2021-12-04 ENCOUNTER — Encounter: Payer: Self-pay | Admitting: Surgery

## 2021-12-04 VITALS — BP 126/79 | HR 75 | Temp 98.2°F | Wt 212.8 lb

## 2021-12-04 DIAGNOSIS — K750 Abscess of liver: Secondary | ICD-10-CM | POA: Diagnosis not present

## 2021-12-04 LAB — COMPREHENSIVE METABOLIC PANEL
ALT: 28 U/L (ref 0–44)
AST: 29 U/L (ref 15–41)
Albumin: 3.6 g/dL (ref 3.5–5.0)
Alkaline Phosphatase: 124 U/L (ref 38–126)
Anion gap: 4 — ABNORMAL LOW (ref 5–15)
BUN: 17 mg/dL (ref 8–23)
CO2: 24 mmol/L (ref 22–32)
Calcium: 9.4 mg/dL (ref 8.9–10.3)
Chloride: 114 mmol/L — ABNORMAL HIGH (ref 98–111)
Creatinine, Ser: 0.81 mg/dL (ref 0.61–1.24)
GFR, Estimated: >60 mL/min (ref >60)
Glucose, Bld: 106 mg/dL — ABNORMAL HIGH (ref 70–99)
Potassium: 4.3 mmol/L (ref 3.5–5.1)
Sodium: 142 mmol/L (ref 135–145)
Total Bilirubin: 0.8 mg/dL (ref 0.3–1.2)
Total Protein: 7.2 g/dL (ref 6.5–8.1)

## 2021-12-04 LAB — CBC
HCT: 49.9 % (ref 39.0–52.0)
Hemoglobin: 16.3 g/dL (ref 13.0–17.0)
MCH: 29.4 pg (ref 26.0–34.0)
MCHC: 32.7 g/dL (ref 30.0–36.0)
MCV: 90.1 fL (ref 80.0–100.0)
Platelets: 270 10*3/uL (ref 150–400)
RBC: 5.54 MIL/uL (ref 4.22–5.81)
RDW: 13.2 % (ref 11.5–15.5)
WBC: 5.9 10*3/uL (ref 4.0–10.5)
nRBC: 0 % (ref 0.0–0.2)

## 2021-12-04 NOTE — Patient Instructions (Addendum)
Your CT is scheduled for 12/06/2021 at 10 am (arrive by 9:45 am) @ [facility] on [address]. Nothing to eat or drink 4 hours prior. Pick up contrast today or tomorrow at Radiology.   Please get labs drawn. You may go to [facility]. You do not need an appointment.     If you have any concerns or questions, please feel free to call our office. See follow up appointment below.   Liver Abscess  A liver abscess is an infected area inside the liver that contains a collection of pus. The liver is a large organ in the upper right side of the abdomen. It has many important functions, including storing energy, producing fluids that the body needs, and removing harmful substances from the bloodstream. A liver abscess can be dangerous if not treated. What are the causes? This condition may be caused by: A bacterial infection. An infection with a parasite called an amoeba (Entamoeba histolytica). An infection with a fungus called Candida. This is rare. A liver abscess can occur: When infections spread to the liver from other parts of the abdomen, such as the appendix (appendicitis), the large intestine (diverticulitis), or the gallbladder (cholecystitis). When infections spread to the liver from other parts of the body through the bloodstream. After abdominal surgery or a penetrating injury to the abdomen. What increases the risk? You are more likely to develop this condition if you: Have diabetes. Have a liver disease (cirrhosis) in which healthy liver tissue is replaced by scar tissue. Have a weakened disease-fighting system (immune system). Take a specific type of medicine to reduce stomach acid (proton pump inhibitor). Are older. Are male. What are the signs or symptoms? Symptoms of this condition often come on slowly over many days or a few weeks. The most common symptoms are: Fever. Pain in the upper right side of the abdomen. Other symptoms include: A general ill  feeling (malaise). Chills. Nausea and vomiting. Loss of appetite. Diarrhea. Weight loss. How is this diagnosed? This condition may be diagnosed based on: Your symptoms, your medical history, and a physical exam. Blood tests to check for infections. Imaging tests, such as a CT scan or ultrasound. Imaging tests are often the best way to find an abscess. Needle aspiration. In this procedure, a long needle is put through the skin and into the liver. A sample of pus is drained out through the needle. The sample is studied under a microscope, and a culture test is done. How is this treated? Treatment for this condition depends on the cause of the infection. If the abscess was caused by bacteria, treatment usually involves having the pus drained and taking antibiotic medicines. You may need to be hospitalized and given antibiotics directly into a vein through an IV. The pus may be drained from the abscess through needle aspiration or by making an incision in the abscess. If the abscess was caused by an amoeba or a fungus, drainage of the abscess is usually not needed. Antimicrobial medicines are normally used to treat those infections. Follow these instructions at home: Medicines Take over-the-counter and prescription medicines only as told by your health care provider. If you were prescribed an antibiotic medicine, take it as told by your health care provider. Do not stop using the antibiotic even if you start to feel better. General instructions  Rest as much as possible and get plenty of sleep. Avoid activities that take a lot of effort. Return to your normal activities as told by your  health care provider. Ask your health care provider what activities are safe for you. Do not lift anything that is heavier than 10 lb (4.5 kg), or the limit that you are told, until your health care provider says that it is safe. Do not drink alcohol until your health care provider approves. Keep all follow-up  visits. This is important. Contact a health care provider if: Your symptoms get worse, or you develop any of the following symptoms: Pain or swelling in your abdomen. Loss of appetite. Nausea or vomiting. Diarrhea. Fever, chills, or sweats. Your skin or the white parts of your eyes turn yellow (jaundice). Get help right away if: Your abdominal pain suddenly gets worse. You develop confusion. These symptoms may represent a serious problem that is an emergency. Do not wait to see if the symptoms will go away. Get medical help right away. Call your local emergency services (911 in the [place] Do not drive yourself to the hospital. Summary A liver abscess is an infected area inside the liver that contains a collection of pus. Liver abscesses often cause abdominal pain, fever, and other symptoms. A liver abscess can be dangerous if not treated. If the abscess was caused by bacteria, treatment usually involves having the pus drained and taking antibiotic medicines. If the abscess was caused by an amoeba or a fungus, drainage of the abscess is usually not needed. Antimicrobial medicines are normally used to treat those infections. Get help right away if your abdominal pain suddenly gets worse or you develop confusion. This information is not intended to replace advice given to you by your health care provider. Make sure you discuss any questions you have with your health care provider. Document Revised: 03/30/2020 Document Reviewed: 03/30/2020 Elsevier Patient Education  [place].

## 2021-12-05 DIAGNOSIS — M9905 Segmental and somatic dysfunction of pelvic region: Secondary | ICD-10-CM | POA: Diagnosis not present

## 2021-12-05 DIAGNOSIS — M5136 Other intervertebral disc degeneration, lumbar region: Secondary | ICD-10-CM | POA: Diagnosis not present

## 2021-12-05 DIAGNOSIS — M5416 Radiculopathy, lumbar region: Secondary | ICD-10-CM | POA: Diagnosis not present

## 2021-12-05 DIAGNOSIS — M9903 Segmental and somatic dysfunction of lumbar region: Secondary | ICD-10-CM | POA: Diagnosis not present

## 2021-12-06 ENCOUNTER — Ambulatory Visit
Admission: RE | Admit: 2021-12-06 | Discharge: 2021-12-06 | Disposition: A | Discharge status: Home / Self Care | Payer: Medicare HMO | Source: Ambulatory Visit | Attending: Surgery | Admitting: Surgery

## 2021-12-06 DIAGNOSIS — K651 Peritoneal abscess: Secondary | ICD-10-CM | POA: Diagnosis not present

## 2021-12-06 DIAGNOSIS — K750 Abscess of liver: Secondary | ICD-10-CM | POA: Insufficient documentation

## 2021-12-06 DIAGNOSIS — N200 Calculus of kidney: Secondary | ICD-10-CM | POA: Diagnosis not present

## 2021-12-06 MED ORDER — IOHEXOL 300 MG/ML  SOLN
100.0000 mL | Freq: Once | INTRAMUSCULAR | Status: AC | PRN
  Administered 2021-12-06: 100 mL via INTRAVENOUS

## 2021-12-07 DIAGNOSIS — M9903 Segmental and somatic dysfunction of lumbar region: Secondary | ICD-10-CM | POA: Diagnosis not present

## 2021-12-07 DIAGNOSIS — M5416 Radiculopathy, lumbar region: Secondary | ICD-10-CM | POA: Diagnosis not present

## 2021-12-07 DIAGNOSIS — M9905 Segmental and somatic dysfunction of pelvic region: Secondary | ICD-10-CM | POA: Diagnosis not present

## 2021-12-07 DIAGNOSIS — M5136 Other intervertebral disc degeneration, lumbar region: Secondary | ICD-10-CM | POA: Diagnosis not present

## 2021-12-08 ENCOUNTER — Encounter: Payer: Self-pay | Admitting: Surgery

## 2021-12-08 NOTE — Progress Notes (Signed)
Outpatient Surgical Follow Up  12/08/2021  Mark Martinez is an 71 y.o. male.   Chief Complaint  Patient presents with   Follow-up    Liver abscess    HPI: Mark Martinez is a 71 year old male well-known to me with a prior history of cholecystectomy over a year and a half ago. He did develop a hepatic abscess a few months ago treated with percutaneous drain and antibiotics.   Out of abundance of caution we performed an extensive work-up to include a HIDA scan and an MRI.  Please note that I have personally reviewed those images and compare them with prior imaging studies the good news is that there is no evidence of neoplastic process in the liver and there is just one a small collection next to the gallbladder fossa.  It is not easily accessible for percutaneous drainage and is not a clear-cut abscess. There is no evidence of bile leak o or any other concerning pathology  He continues to have intermittent right upper quadrant pain and chest wall pain. He seems to point to a rib. Pain moderate in intensity without a specific alleviating or aggravating factors.  No fevers no chills he is tolerating diet.  He is walking.    Past Medical History:  Diagnosis Date   Cataract    COVID-19 virus infection 06/2019   ED (erectile dysfunction)    Elevated LFTs    Esophageal stricture    GERD (gastroesophageal reflux disease)    with hiatal hernia   H/O hiatal hernia    History of prostatitis    HLD (hyperlipidemia)    Prediabetes 07/28/2015   Shortness of breath    exersion   Tubular adenoma of colon     Past Surgical History:  Procedure Laterality Date   BACK SURGERY  1994   lumbar area   CHOLECYSTECTOMY     COLONOSCOPY  2005   WNL, rpt 10 yrs   COLONOSCOPY  07/2019   1 TA, hemorrhoids, rpt 7 yrs Christella Hartigan)   CYST REMOVAL NECK Left 08/31/2013   Procedure: EXCISION EPIDERMAL INCLUSION CYST LEFT NECK;  Surgeon: Liz Malady, MD   ESOPHAGEAL DILATION     three last    ESOPHAGOGASTRODUODENOSCOPY  12-27-1996   hiatal hernia (Dr. Jarold Motto)   SHOULDER ARTHROSCOPY WITH ROTATOR CUFF REPAIR AND SUBACROMIAL DECOMPRESSION Right 01/2020   distal clavicle resection, RTC repair (Supple)    Family History  Problem Relation Age of Onset   Valvular heart disease Mother    Ulcers Father    CAD Father 71       multiple stents   CAD Paternal Uncle    Cancer Neg Hx    Diabetes Neg Hx    Stroke Neg Hx    Colon cancer Neg Hx    Esophageal cancer Neg Hx    Pancreatic cancer Neg Hx    Liver disease Neg Hx    Stomach cancer Neg Hx     Social History:  reports that he has never smoked. He has never been exposed to tobacco smoke. He quit smokeless tobacco use about 12 years ago. He reports that he does not drink alcohol and does not use drugs.  Allergies:  Allergies  Allergen Reactions   Codeine Other (See Comments)    Keeps him awake    Medications reviewed.    ROS Full ROS performed and is otherwise negative other than what is stated in HPI   BP 126/79   Pulse 75   Temp 98.2  F (36.8 C) (Oral)   Wt 212 lb 12.8 oz (96.5 kg)   SpO2 96%   BMI 28.86 kg/m   Physical Exam Vitals and nursing note reviewed. Exam conducted with a chaperone present.  Constitutional:      General: He is not in acute distress.    Appearance: Normal appearance. He is normal weight. He is not toxic-appearing.  Eyes:     General: No scleral icterus.       Right eye: No discharge.        Left eye: No discharge.  Cardiovascular:     Rate and Rhythm: Normal rate.     Heart sounds: No murmur heard. Pulmonary:     Effort: Pulmonary effort is normal. No respiratory distress.     Breath sounds: Normal breath sounds. No stridor.  Abdominal:     General: Abdomen is flat. There is no distension.     Palpations: Abdomen is soft. There is no mass.     Tenderness: There is no abdominal tenderness. There is no guarding or rebound.     Hernia: No hernia is present.   Musculoskeletal:        General: No swelling or tenderness. Normal range of motion.  Skin:    General: Skin is warm and dry.     Capillary Refill: Capillary refill takes less than 2 seconds.  Neurological:     General: No focal deficit present.     Mental Status: He is alert and oriented to person, place, and time.  Psychiatric:        Mood and Affect: Mood normal.        Behavior: Behavior normal.        Thought Content: Thought content normal.        Judgment: Judgment normal.       Assessment/Plan: Resolving Liver abscess of unclear  etiology.   He is  more than a year and a half out from cholecystectomy and work-up has failed to reveal evidence of a biliary leak or direct complication from gallbladder surgery.  More than likely he did have colonic infection in the form of diverticulitis that caused an abscess. Continues to have intermittent chest wall pain and right upper quadrant pain.  I DisCussed with the patient in detail about repeating another imaging study to r/o any worsening infection. He wishes to have this done.  We will order CT scan of the abdomen pelvis as well as repeat labs and see him in a few weeks. HE Does not need emergent surgical intervention or hospitalization at this time I spent 30 min in this encounter including pers. Reviewing images, coordinating his care, placing orders, counseling the pt and performing appropriate documentation    Sterling Big, MD [facility] General Surgeon

## 2021-12-13 DIAGNOSIS — M9905 Segmental and somatic dysfunction of pelvic region: Secondary | ICD-10-CM | POA: Diagnosis not present

## 2021-12-13 DIAGNOSIS — M5136 Other intervertebral disc degeneration, lumbar region: Secondary | ICD-10-CM | POA: Diagnosis not present

## 2021-12-13 DIAGNOSIS — M9903 Segmental and somatic dysfunction of lumbar region: Secondary | ICD-10-CM | POA: Diagnosis not present

## 2021-12-13 DIAGNOSIS — M5416 Radiculopathy, lumbar region: Secondary | ICD-10-CM | POA: Diagnosis not present

## 2021-12-20 DIAGNOSIS — M9905 Segmental and somatic dysfunction of pelvic region: Secondary | ICD-10-CM | POA: Diagnosis not present

## 2021-12-20 DIAGNOSIS — M5416 Radiculopathy, lumbar region: Secondary | ICD-10-CM | POA: Diagnosis not present

## 2021-12-20 DIAGNOSIS — M5136 Other intervertebral disc degeneration, lumbar region: Secondary | ICD-10-CM | POA: Diagnosis not present

## 2021-12-20 DIAGNOSIS — M9903 Segmental and somatic dysfunction of lumbar region: Secondary | ICD-10-CM | POA: Diagnosis not present

## 2021-12-25 ENCOUNTER — Ambulatory Visit: Payer: Medicare HMO | Admitting: Surgery

## 2021-12-25 ENCOUNTER — Encounter: Payer: Self-pay | Admitting: Surgery

## 2021-12-25 VITALS — BP 102/70 | HR 91 | Temp 98.2°F | Wt 213.2 lb

## 2021-12-25 DIAGNOSIS — K750 Abscess of liver: Secondary | ICD-10-CM

## 2021-12-25 DIAGNOSIS — R109 Unspecified abdominal pain: Secondary | ICD-10-CM

## 2021-12-25 DIAGNOSIS — R0789 Other chest pain: Secondary | ICD-10-CM | POA: Diagnosis not present

## 2021-12-25 NOTE — Patient Instructions (Signed)
Please call the office if you have questions or concerns.

## 2021-12-27 DIAGNOSIS — M9905 Segmental and somatic dysfunction of pelvic region: Secondary | ICD-10-CM | POA: Diagnosis not present

## 2021-12-27 DIAGNOSIS — M5136 Other intervertebral disc degeneration, lumbar region: Secondary | ICD-10-CM | POA: Diagnosis not present

## 2021-12-27 DIAGNOSIS — M9903 Segmental and somatic dysfunction of lumbar region: Secondary | ICD-10-CM | POA: Diagnosis not present

## 2021-12-27 DIAGNOSIS — M5416 Radiculopathy, lumbar region: Secondary | ICD-10-CM | POA: Diagnosis not present

## 2021-12-28 NOTE — Progress Notes (Signed)
Outpatient Surgical Follow Up  12/28/2021  Mark Martinez is an 71 y.o. male.   Chief Complaint  Patient presents with   Follow-up    Liver abscess    HPI: Mark Martinez is a 71 year old male well-known to me with a prior history of cholecystectomy over a year and a half ago. He did develop a hepatic abscess a few months ago treated with percutaneous drain and antibiotics.   Out of abundance of caution we performed an extensive work-up to include a HIDA scan and an MRI.  Please note that I have personally reviewed those images and compare them with prior imaging studies the good news is that there is no evidence of neoplastic process in the liver and there is just one a small collection next to the gallbladder fossa.  It is not easily accessible for percutaneous drainage and is not a clear-cut abscess. There is no evidence of bile leak o or any other concerning pathology Reason we decided to repeat the CT scan because of intermittent chest wall and abdominal pain.  I have personally reviewed the CT scan and had an extensive discussion with Mark Martinez.  There is a very small fluid collection that I do not think that is an abscess.  No evidence of suspicious lesions for carcinoma or anything like this.  Past Medical History:  Diagnosis Date   Cataract    COVID-19 virus infection 06/2019   ED (erectile dysfunction)    Elevated LFTs    Esophageal stricture    GERD (gastroesophageal reflux disease)    with hiatal hernia   H/O hiatal hernia    History of prostatitis    HLD (hyperlipidemia)    Prediabetes 07/28/2015   Shortness of breath    exersion   Tubular adenoma of colon     Past Surgical History:  Procedure Laterality Date   BACK SURGERY  1994   lumbar area   CHOLECYSTECTOMY     COLONOSCOPY  2005   WNL, rpt 10 yrs   COLONOSCOPY  07/2019   1 TA, hemorrhoids, rpt 7 yrs [name])   CYST REMOVAL NECK Left 08/31/2013   Procedure: EXCISION EPIDERMAL INCLUSION CYST LEFT NECK;  Surgeon: Zenovia Jarred, MD   ESOPHAGEAL DILATION     three last   ESOPHAGOGASTRODUODENOSCOPY  12-27-1996   hiatal hernia (Dr. Sharlett Iles)   SHOULDER ARTHROSCOPY WITH ROTATOR CUFF REPAIR AND SUBACROMIAL DECOMPRESSION Right 01/2020   distal clavicle resection, RTC repair (Supple)    Family History  Problem Relation Age of Onset   Valvular heart disease Mother    Ulcers Father    CAD Father 24       multiple stents   CAD Paternal Uncle    Cancer Neg Hx    Diabetes Neg Hx    Stroke Neg Hx    Colon cancer Neg Hx    Esophageal cancer Neg Hx    Pancreatic cancer Neg Hx    Liver disease Neg Hx    Stomach cancer Neg Hx     Social History:  reports that he has never smoked. He has never been exposed to tobacco smoke. He quit smokeless tobacco use about 12 years ago. He reports that he does not drink alcohol and does not use drugs.  Allergies:  Allergies  Allergen Reactions   Codeine Other (See Comments)    Keeps him awake    Medications reviewed.    ROS Full ROS performed and is otherwise negative other than what is stated in  HPI   BP 102/70   Pulse 91   Temp 98.2 F (36.8 C) (Oral)   Wt 213 lb 3.2 oz (96.7 kg)   SpO2 96%   BMI 28.92 kg/m   Physical Exam Vitals and nursing note reviewed. Exam conducted with a chaperone present.  Constitutional:      General: He is not in acute distress.    Appearance: Normal appearance. He is not ill-appearing or toxic-appearing.  Eyes:     General:        Right eye: No discharge.        Left eye: No discharge.  Cardiovascular:     Rate and Rhythm: Normal rate and regular rhythm.     Heart sounds: No murmur heard. Pulmonary:     Effort: Pulmonary effort is normal. No respiratory distress.     Breath sounds: Normal breath sounds. No stridor.  Abdominal:     General: Abdomen is flat. There is no distension.     Palpations: There is no mass.     Tenderness: There is no abdominal tenderness. There is no guarding.     Hernia: No hernia is  present.  Musculoskeletal:        General: No swelling. Normal range of motion.     Cervical back: Normal range of motion and neck supple. No rigidity.  Skin:    General: Skin is warm and dry.     Capillary Refill: Capillary refill takes less than 2 seconds.  Neurological:     Mental Status: He is alert.  Psychiatric:        Mood and Affect: Mood normal.        Behavior: Behavior normal.        Thought Content: Thought content normal.        Judgment: Judgment normal.     Assessment/Plan: Resolved liver abscess of unclear etiology.  At this time he is asymptomatic from abdominal perspective and does have some intermittent chest wall pain that might be attributed to intercostal nerve neuralgia.  I had a very candid conversation with the patient I do not think he needs more antibiotics more imaging studies or anything else.  He shows understanding.  Return to clinic as needed Please note I spent at least 30 minutes in this encounter including personally reviewing imaging studies, counseling the patient and performing appropriate documentation  Caroleen Hamman, MD [facility] Surgeon

## 2022-01-10 DIAGNOSIS — M5416 Radiculopathy, lumbar region: Secondary | ICD-10-CM | POA: Diagnosis not present

## 2022-01-10 DIAGNOSIS — M9905 Segmental and somatic dysfunction of pelvic region: Secondary | ICD-10-CM | POA: Diagnosis not present

## 2022-01-10 DIAGNOSIS — M5136 Other intervertebral disc degeneration, lumbar region: Secondary | ICD-10-CM | POA: Diagnosis not present

## 2022-01-10 DIAGNOSIS — M9903 Segmental and somatic dysfunction of lumbar region: Secondary | ICD-10-CM | POA: Diagnosis not present

## 2022-01-24 DIAGNOSIS — M9905 Segmental and somatic dysfunction of pelvic region: Secondary | ICD-10-CM | POA: Diagnosis not present

## 2022-01-24 DIAGNOSIS — M9903 Segmental and somatic dysfunction of lumbar region: Secondary | ICD-10-CM | POA: Diagnosis not present

## 2022-01-24 DIAGNOSIS — M5416 Radiculopathy, lumbar region: Secondary | ICD-10-CM | POA: Diagnosis not present

## 2022-01-24 DIAGNOSIS — M5136 Other intervertebral disc degeneration, lumbar region: Secondary | ICD-10-CM | POA: Diagnosis not present

## 2022-02-14 DIAGNOSIS — M9905 Segmental and somatic dysfunction of pelvic region: Secondary | ICD-10-CM | POA: Diagnosis not present

## 2022-02-14 DIAGNOSIS — M5416 Radiculopathy, lumbar region: Secondary | ICD-10-CM | POA: Diagnosis not present

## 2022-02-14 DIAGNOSIS — M9903 Segmental and somatic dysfunction of lumbar region: Secondary | ICD-10-CM | POA: Diagnosis not present

## 2022-02-14 DIAGNOSIS — M5136 Other intervertebral disc degeneration, lumbar region: Secondary | ICD-10-CM | POA: Diagnosis not present

## 2022-03-14 DIAGNOSIS — M5136 Other intervertebral disc degeneration, lumbar region: Secondary | ICD-10-CM | POA: Diagnosis not present

## 2022-03-14 DIAGNOSIS — M5416 Radiculopathy, lumbar region: Secondary | ICD-10-CM | POA: Diagnosis not present

## 2022-03-14 DIAGNOSIS — M9905 Segmental and somatic dysfunction of pelvic region: Secondary | ICD-10-CM | POA: Diagnosis not present

## 2022-03-14 DIAGNOSIS — M9903 Segmental and somatic dysfunction of lumbar region: Secondary | ICD-10-CM | POA: Diagnosis not present

## 2022-04-11 DIAGNOSIS — M9905 Segmental and somatic dysfunction of pelvic region: Secondary | ICD-10-CM | POA: Diagnosis not present

## 2022-04-11 DIAGNOSIS — M5136 Other intervertebral disc degeneration, lumbar region: Secondary | ICD-10-CM | POA: Diagnosis not present

## 2022-04-11 DIAGNOSIS — M5416 Radiculopathy, lumbar region: Secondary | ICD-10-CM | POA: Diagnosis not present

## 2022-04-11 DIAGNOSIS — M9903 Segmental and somatic dysfunction of lumbar region: Secondary | ICD-10-CM | POA: Diagnosis not present

## 2022-04-25 ENCOUNTER — Ambulatory Visit: Payer: Medicare HMO | Admitting: Dermatology

## 2022-04-25 DIAGNOSIS — L821 Other seborrheic keratosis: Secondary | ICD-10-CM

## 2022-04-25 DIAGNOSIS — L578 Other skin changes due to chronic exposure to nonionizing radiation: Secondary | ICD-10-CM | POA: Diagnosis not present

## 2022-04-25 DIAGNOSIS — D489 Neoplasm of uncertain behavior, unspecified: Secondary | ICD-10-CM

## 2022-04-25 DIAGNOSIS — L570 Actinic keratosis: Secondary | ICD-10-CM

## 2022-04-25 DIAGNOSIS — L820 Inflamed seborrheic keratosis: Secondary | ICD-10-CM | POA: Diagnosis not present

## 2022-04-25 DIAGNOSIS — Z1283 Encounter for screening for malignant neoplasm of skin: Secondary | ICD-10-CM | POA: Diagnosis not present

## 2022-04-25 DIAGNOSIS — D229 Melanocytic nevi, unspecified: Secondary | ICD-10-CM

## 2022-04-25 DIAGNOSIS — L309 Dermatitis, unspecified: Secondary | ICD-10-CM | POA: Diagnosis not present

## 2022-04-25 DIAGNOSIS — L814 Other melanin hyperpigmentation: Secondary | ICD-10-CM | POA: Diagnosis not present

## 2022-04-25 MED ORDER — TRIAMCINOLONE ACETONIDE 0.1 % EX OINT: 1.0000 | TOPICAL_OINTMENT | Freq: Two times a day (BID) | CUTANEOUS | 0 refills | Status: DC | PRN

## 2022-04-25 NOTE — Patient Instructions (Addendum)
Instructions for [facility]ations  One or more of your medications was sent to the [facility] mail order compounding pharmacy. You will receive an email from them and can purchase the medicine through that link. It will then be mailed to your home at the address you confirmed. If for any reason you do not receive an email from them, please check your spam folder. If you still do not find the email, please let us know. [facility] phone number is [phone].     Plan to start cream after holidays  - Start 5-fluorouracil/calcipotriene cream twice a day for 7 days to affected areas including neck and nose, forehead, and right temple twice a day for 4 days . Prescription sent to [facility]. Patient advised they will receive an email to purchase the medication online and have it sent to their home. Patient provided with handout reviewing treatment course and side effects and advised to call or message [place] on MyChart with any concerns.  Reviewed course of treatment and expected reaction.  Patient advised to expect inflammation and crusting and advised that erosions are possible.  Patient advised to be diligent with sun protection during and after treatment. Counseled to keep medication out of reach of children and pets.     5-Fluorouracil/Calcipotriene Patient Education   Actinic keratoses are the dry, red scaly spots on the skin caused by sun damage. A portion of these spots can turn into skin cancer with time, and treating them can help prevent development of skin cancer.   Treatment of these spots requires removal of the defective skin cells. There are various ways to remove actinic keratoses, including freezing with liquid nitrogen, treatment with creams, or treatment with a blue light procedure in the office.   5-fluorouracil cream is a topical cream used to treat actinic keratoses. It works by interfering with the growth of abnormal fast-growing  skin cells, such as actinic keratoses. These cells peel off and are replaced by healthy ones.   5-fluorouracil/calcipotriene is a combination of the 5-fluorouracil cream with a vitamin D analog cream called calcipotriene. The calcipotriene alone does not treat actinic keratoses. However, when it is combined with 5-fluorouracil, it helps the 5-fluorouracil treat the actinic keratoses much faster so that the same results can be achieved with a much shorter treatment time.  INSTRUCTIONS FOR 5-FLUOROURACIL/CALCIPOTRIENE CREAM:   5-fluorouracil/calcipotriene cream typically only needs to be used for 4-7 days. A thin layer should be applied twice a day to the treatment areas recommended by your physician.   If your physician prescribed you separate tubes of 5-fluourouracil and calcipotriene, apply a thin layer of 5-fluorouracil followed by a thin layer of calcipotriene.   Avoid contact with your eyes, nostrils, and mouth. Do not use 5-fluorouracil/calcipotriene cream on infected or open wounds.   You will develop redness, irritation and some crusting at areas where you have pre-cancer damage/actinic keratoses. IF YOU DEVELOP PAIN, BLEEDING, OR SIGNIFICANT CRUSTING, STOP THE TREATMENT EARLY - you have already gotten a good response and the actinic keratoses should clear up well.  Wash your hands after applying 5-fluorouracil 5% cream on your skin.   A moisturizer or sunscreen with a minimum SPF 30 should be applied each morning.   Once you have finished the treatment, you can apply a thin layer of Vaseline twice a day to irritated areas to soothe and calm the areas more quickly. If you experience significant discomfort, contact your physician.  For some patients it is necessary  to repeat the treatment for best results.  SIDE EFFECTS: When using 5-fluorouracil/calcipotriene cream, you may have mild irritation, such as redness, dryness, swelling, or a mild burning sensation. This usually resolves within  2 weeks. The more actinic keratoses you have, the more redness and inflammation you can expect during treatment. Eye irritation has been reported rarely. If this occurs, please let us know.  If you have any trouble using this cream, please call the office. If you have any other questions about this information, please do not hesitate to ask me before you leave the office.         Recommend OTC Gold Bond Rapid Relief Anti-Itch cream (pramoxine + menthol), CeraVe Anti-itch cream or lotion (pramoxine), Sarna lotion (Original- menthol + camphor or Sensitive- pramoxine) or Eucerin 12 hour Itch Relief lotion (menthol) up to 3 times per day to areas on body that are itchy.    Actinic keratoses are precancerous spots that appear secondary to cumulative UV radiation exposure/sun exposure over time. They are chronic with expected duration over 1 year. A portion of actinic keratoses will progress to squamous cell carcinoma of the skin. It is not possible to reliably predict which spots will progress to skin cancer and so treatment is recommended to prevent development of skin cancer.  Recommend daily broad spectrum sunscreen SPF 30+ to sun-exposed areas, reapply every 2 hours as needed.  Recommend staying in the shade or wearing long sleeves, sun glasses (UVA+UVB protection) and wide brim hats (4-inch brim around the entire circumference of the hat). Call for new or changing lesions.   Cryotherapy Aftercare  Wash gently with soap and water everyday.   Apply Vaseline and Band-Aid daily until healed.    Biopsy Wound Care Instructions  Leave the original bandage on for 24 hours if possible.  If the bandage becomes soaked or soiled before that time, it is OK to remove it and examine the wound.  A small amount of post-operative bleeding is normal.  If excessive bleeding occurs, remove the bandage, place gauze over the site and apply continuous pressure (no peeking) over the area for 30 minutes. If this  does not work, please call our clinic as soon as possible or page your doctor if it is after hours.   Once a day, cleanse the wound with soap and water. It is fine to shower. If a thick crust develops you may use a Q-tip dipped into dilute hydrogen peroxide (mix 1:1 with water) to dissolve it.  Hydrogen peroxide can slow the healing process, so use it only as needed.    After washing, apply petroleum jelly (Vaseline) or an antibiotic ointment if your doctor prescribed one for you, followed by a bandage.    For best healing, the wound should be covered with a layer of ointment at all times. If you are not able to keep the area covered with a bandage to hold the ointment in place, this may mean re-applying the ointment several times a day.  Continue this wound care until the wound has healed and is no longer open.   Itching and mild discomfort is normal during the healing process. However, if you develop pain or severe itching, please call our office.   If you have any discomfort, you can take Tylenol (acetaminophen) or ibuprofen as directed on the bottle. (Please do not take these if you have an allergy to them or cannot take them for another reason).  Some redness, tenderness and white or yellow material in  the wound is normal healing.  If the area becomes very sore and red, or develops a thick yellow-green material (pus), it may be infected; please notify us.    If you have stitches, return to clinic as directed to have the stitches removed. You will continue wound care for 2-3 days after the stitches are removed.   Wound healing continues for up to one year following surgery. It is not unusual to experience pain in the scar from time to time during the interval.  If the pain becomes severe or the scar thickens, you should notify the office.    A slight amount of redness in a scar is expected for the first six months.  After six months, the redness will fade and the scar will soften and fade.  The  color difference becomes less noticeable with time.  If there are any problems, return for a post-op surgery check at your earliest convenience.  To improve the appearance of the scar, you can use silicone scar gel, cream, or sheets (such as Mederma or Serica) every night for up to one year. These are available over the counter (without a prescription).  Please call our office at [phone] for any questions or concerns.        Recommend taking Heliocare sun protection supplement daily in sunny weather for additional sun protection. For maximum protection on the sunniest days, you can take up to 2 capsules of regular Heliocare OR take 1 capsule of Heliocare Ultra. For prolonged exposure (such as a full day in the sun), you can repeat your dose of the supplement 4 hours after your first dose. Heliocare can be purchased at [employer], at some [facility] or at [email or web address].      Melanoma ABCDEs  Melanoma is the most dangerous type of skin cancer, and is the leading cause of death from skin disease.  You are more likely to develop melanoma if you: Have light-colored skin, light-colored eyes, or red or blond hair Spend a lot of time in the sun Tan regularly, either outdoors or in a tanning bed Have had blistering sunburns, especially during childhood Have a close family member who has had a melanoma Have atypical moles or large birthmarks  Early detection of melanoma is key since treatment is typically straightforward and cure rates are extremely high if we catch it early.   The first sign of melanoma is often a change in a mole or a new dark spot.  The ABCDE system is a way of remembering the signs of melanoma.  A for asymmetry:  The two halves do not match. B for border:  The edges of the growth are irregular. C for color:  A mixture of colors are present instead of an even brown color. D for diameter:  Melanomas are usually (but not always) greater than 23m - the size  of a pencil eraser. E for evolution:  The spot keeps changing in size, shape, and color.  Please check your skin once per month between visits. You can use a small mirror in front and a large mirror behind you to keep an eye on the back side or your body.   If you see any new or changing lesions before your next follow-up, please call to schedule a visit.  Please continue daily skin protection including broad spectrum sunscreen SPF 30+ to sun-exposed areas, reapplying every 2 hours as needed when you're outdoors.   Staying in the shade or wearing long sleeves, sun glasses (  UVA+UVB protection) and wide brim hats (4-inch brim around the entire circumference of the hat) are also recommended for sun protection.    Due to recent changes in healthcare laws, you may see results of your pathology and/or laboratory studies on MyChart before the doctors have had a chance to review them. We understand that in some cases there may be results that are confusing or concerning to you. Please understand that not all results are received at the same time and often the doctors may need to interpret multiple results in order to provide you with the best plan of care or course of treatment. Therefore, we ask that you please give [place] 2 business days to thoroughly review all your results before contacting the office for clarification. Should we see a critical lab result, you will be contacted sooner.   If You Need Anything After Your Visit  If you have any questions or concerns for your doctor, please call our main line at [phone] and press option 4 to reach your doctor's medical assistant. If no one answers, please leave a voicemail as directed and we will return your call as soon as possible. Messages left after 4 pm will be answered the following business day.   You may also send [place] a message via [place]. We typically respond to MyChart messages within 1-2 business days.  For prescription refills, please ask  your pharmacy to contact our office. Our fax number is [phone].  If you have an urgent issue when the clinic is closed that cannot wait until the next business day, you can page your doctor at the number below.    Please note that while we do our best to be available for urgent issues outside of office hours, we are not available 24/7.   If you have an urgent issue and are unable to reach [place], you may choose to seek medical care at your doctor's office, retail clinic, urgent care center, or emergency room.  If you have a medical emergency, please immediately call 911 or go to the emergency department.  Pager Numbers  - Dr. Nehemiah Massed: [phone]  - Dr. Laurence Ferrari: [phone]  - Dr. Nicole Kindred: [phone]  In the event of inclement weather, please call our main line at [phone] for an update on the status of any delays or closures.  Dermatology Medication Tips: Please keep the boxes that topical medications come in in order to help keep track of the instructions about where and how to use these. Pharmacies typically print the medication instructions only on the boxes and not directly on the medication tubes.   If your medication is too expensive, please contact our office at [phone] option 4 or send [place] a message through [place].   We are unable to tell what your co-pay for medications will be in advance as this is different depending on your insurance coverage. However, we may be able to find a substitute medication at lower cost or fill out paperwork to get insurance to cover a needed medication.   If a prior authorization is required to get your medication covered by your insurance company, please allow [place] 1-2 business days to complete this process.  Drug prices often vary depending on where the prescription is filled and some pharmacies may offer cheaper prices.  The website www.goodrx.com contains coupons for medications through different pharmacies. The prices here do not  account for what the cost may be with help from insurance (it may be cheaper with your insurance), but the  website can give you the price if you did not use any insurance.  - You can print the associated coupon and take it with your prescription to the pharmacy.  - You may also stop by our office during regular business hours and pick up a GoodRx coupon card.  - If you need your prescription sent electronically to a different pharmacy, notify our office through [facility] or by phone at [phone] option 4.     Si Usted Necesita Algo Despus de Su Visita  Tambin puede enviarnos un mensaje a travs de Pharmacist, community. Por lo general respondemos a los mensajes de MyChart en el transcurso de 1 a 2 das hbiles.  Para renovar recetas, por favor pida a su farmacia que se ponga en contacto con nuestra oficina. Harland Dingwall de fax es [place] [phone].  Si tiene un asunto urgente cuando la clnica est cerrada y que no puede esperar hasta el siguiente da hbil, puede llamar/localizar a su doctor(a) al nmero que aparece a continuacin.   Por favor, tenga en cuenta que aunque hacemos todo lo posible para estar disponibles para asuntos urgentes fuera del horario de [place], no estamos disponibles las 24 horas del da, los 7 das de la [place].   Si tiene un problema urgente y no puede comunicarse con nosotros, puede optar por buscar atencin mdica  en el consultorio de su doctor(a), en una clnica privada, en un centro de atencin urgente o en una sala de emergencias.  Si tiene Engineering geologist, por favor llame inmediatamente al 911 o vaya a la sala de emergencias.  Nmeros de bper  - Dr. Nehemiah Massed: [phone]  - Dra. Makaelah Cranfield: [phone]  - Dra. Nicole Kindred: [phone]  En caso de inclemencias del [place], por favor llame a Johnsie Kindred principal al [phone] para una actualizacin sobre el [place] de cualquier retraso o cierre.  Consejos para la medicacin en dermatologa: Por  favor, guarde las cajas en las que vienen los medicamentos de uso tpico para ayudarle a seguir las instrucciones sobre dnde y cmo usarlos. Las farmacias generalmente imprimen las instrucciones del medicamento slo en las cajas y no directamente en los tubos del [place].   Si su medicamento es muy caro, por favor, pngase en contacto con Zigmund Daniel llamando al [phone] y presione la opcin 4 o envenos un mensaje a travs de Pharmacist, community.   No podemos decirle cul ser su copago por los medicamentos por adelantado ya que esto es diferente dependiendo de la cobertura de su seguro. Sin embargo, es posible que podamos encontrar un medicamento sustituto a Electrical engineer un formulario para que el seguro cubra el medicamento que se considera necesario.   Si se requiere una autorizacin previa para que su compaa de seguros [place] su medicamento, por favor permtanos de 1 a 2 das hbiles para completar este proceso.  Los precios de los medicamentos varan con frecuencia dependiendo del Environmental consultant de dnde se surte la receta y alguna farmacias pueden ofrecer precios ms baratos.  El sitio web www.goodrx.com tiene cupones para medicamentos de Airline pilot. Los precios aqu no tienen en cuenta lo que podra costar con la ayuda del seguro (puede ser ms barato con su seguro), pero el sitio web puede darle el precio si no utiliz Research scientist (physical sciences).  - Puede imprimir el cupn correspondiente y llevarlo con su receta a la farmacia.  - Tambin puede pasar por nuestra oficina durante el horario de atencin regular y Charity fundraiser una tarjeta de cupones de GoodRx.  -  Si necesita que su receta se enve electrnicamente a una farmacia diferente, informe a nuestra oficina a travs de MyChart de [place] o por telfono llamando al [phone] y presione la opcin 4.  

## 2022-04-25 NOTE — Progress Notes (Signed)
Follow-Up Visit   Subjective  Mark Martinez is a 71 y.o. male who presents for the following: Annual Exam (1 yr tbse, hx of rosacea reports some spots at right sideburn area, chest, and right arm. ).  The patient presents for Upper Body Skin Exam (UBSE) for skin cancer screening and mole check.  The patient has spots, moles and lesions to be evaluated, some may be new or changing and the patient has concerns that these could be cancer.  The following portions of the chart were reviewed this encounter and updated as appropriate:  Tobacco  Allergies  Meds  Problems  Med Hx  Surg Hx  Fam Hx      Review of Systems: No other skin or systemic complaints except as noted in HPI or Assessment and Plan.   Objective  Well appearing patient in no apparent distress; mood and affect are within normal limits.  All skin waist up examined.  right forearm x 2, right sideburn x 1, (3) Erythematous thin papules/macules with gritty scale.   right chest 0.6 cm erythematous tan papule        Left Breast Nummular thin scaly pink plaque   Assessment & Plan  Actinic keratosis (3) right forearm x 2, right sideburn x 1,  Actinic keratoses are precancerous spots that appear secondary to cumulative UV radiation exposure/sun exposure over time. They are chronic with expected duration over 1 year. A portion of actinic keratoses will progress to squamous cell carcinoma of the skin. It is not possible to reliably predict which spots will progress to skin cancer and so treatment is recommended to prevent development of skin cancer.  Recommend daily broad spectrum sunscreen SPF 30+ to sun-exposed areas, reapply every 2 hours as needed.  Recommend staying in the shade or wearing long sleeves, sun glasses (UVA+UVB protection) and wide brim hats (4-inch brim around the entire circumference of the hat). Call for new or changing lesions.  Destruction of lesion - right forearm x 2, right sideburn x  1,  Destruction method: cryotherapy   Informed consent: discussed and consent obtained   Lesion destroyed using liquid nitrogen: Yes   Cryotherapy cycles:  2 Outcome: patient tolerated procedure well with no complications   Post-procedure details: wound care instructions given   Additional details:  Prior to procedure, discussed risks of blister formation, small wound, skin dyspigmentation, or rare scar following cryotherapy. Recommend Vaseline ointment to treated areas while healing.   Neoplasm of uncertain behavior right chest  Epidermal / dermal shaving  Lesion diameter (cm):  0.6 Informed consent: discussed and consent obtained   Timeout: patient name, date of birth, surgical site, and procedure verified   Patient was prepped and draped in usual sterile fashion: area prepped with isopropyl alcohol. Anesthesia: the lesion was anesthetized in a standard fashion   Anesthetic:  1% lidocaine w/ epinephrine 1-100,000 buffered w/ 8.4% NaHCO3 Instrument used: flexible razor blade   Hemostasis achieved with: aluminum chloride   Outcome: patient tolerated procedure well   Post-procedure details: wound care instructions given   Additional details:  Mupirocin and a bandage applied  Specimen 1 - Surgical pathology Differential Diagnosis: R/o isk vs scc over atypia  Check Margins: No  R/o isk vs scc over atypia  Dermatitis Left Breast  Start TMC ointment twice a day as needed up to 2 weeks. Avoid applying to face, groin, and axilla. Use as directed. Long-term use can cause thinning of the skin.  Topical steroids (such as triamcinolone, fluocinolone,  fluocinonide, mometasone, clobetasol, halobetasol, betamethasone, hydrocortisone) can cause thinning and lightening of the skin if they are used for too long in the same area. Your physician has selected the right strength medicine for your problem and area affected on the body. Please use your medication only as directed by your physician  to prevent side effects.    triamcinolone ointment (KENALOG) 0.1 % - Left Breast Apply 1 Application topically 2 (two) times daily as needed (Rash). Use for up to 2 weeks at rash. Avoid applying to face, groin, and axilla. Use as directed.  Lentigines - Scattered tan macules - Due to sun exposure - Benign-appearing, observe - Recommend daily broad spectrum sunscreen SPF 30+ to sun-exposed areas, reapply every 2 hours as needed. - Call for any changes  Seborrheic Keratoses - Stuck-on, waxy, tan-brown papules and/or plaques  - Benign-appearing - Discussed benign etiology and prognosis. - Observe - Call for any changes  Melanocytic Nevi - Tan-brown and/or pink-flesh-colored symmetric macules and papules - Benign appearing on exam today - Observation - Call clinic for new or changing moles - Recommend daily use of broad spectrum spf 30+ sunscreen to sun-exposed areas.   Hemangiomas - Red papules - Discussed benign nature - Observe - Call for any changes  Actinic Damage with PreCancerous Actinic Keratoses Counseling for Topical Chemotherapy Management: Patient exhibits: - Severe, confluent actinic changes with pre-cancerous actinic keratoses that is secondary to cumulative UV radiation exposure over time - Condition that is severe; chronic, not at goal. - diffuse scaly erythematous macules and papules with underlying dyspigmentation - Discussed Prescription "Field Treatment" topical Chemotherapy for Severe, Chronic Confluent Actinic Changes with Pre-Cancerous Actinic Keratoses Field treatment involves treatment of an entire area of skin that has confluent Actinic Changes (Sun/ Ultraviolet light damage) and PreCancerous Actinic Keratoses by method of PhotoDynamic Therapy (PDT) and/or prescription Topical Chemotherapy agents such as 5-fluorouracil, 5-fluorouracil/calcipotriene, and/or imiquimod.  The purpose is to decrease the number of clinically evident and subclinical PreCancerous  lesions to prevent progression to development of skin cancer by chemically destroying early precancer changes that may or may not be visible.  It has been shown to reduce the risk of developing skin cancer in the treated area. As a result of treatment, redness, scaling, crusting, and open sores may occur during treatment course. One or more than one of these methods may be used and may have to be used several times to control, suppress and eliminate the PreCancerous changes. Discussed treatment course, expected reaction, and possible side effects. - Recommend daily broad spectrum sunscreen SPF 30+ to sun-exposed areas, reapply every 2 hours as needed.  - Staying in the shade or wearing long sleeves, sun glasses (UVA+UVB protection) and wide brim hats (4-inch brim around the entire circumference of the hat) are also recommended. - Call for new or changing lesions. Can start cream after holidays  - Start 5-fluorouracil/calcipotriene cream twice a day for 7 days to affected areas including neck and twice a day for 4 days to nose, forehead, and right temple. Prescription sent to [facility]. Patient advised they will receive an email to purchase the medication online and have it sent to their home. Patient provided with handout reviewing treatment course and side effects and advised to call or message [place] on MyChart with any concerns.  Reviewed course of treatment and expected reaction.  Patient advised to expect inflammation and crusting and advised that erosions are possible.  Patient advised to be diligent with sun protection during and  after treatment. Counseled to keep medication out of reach of children and pets.   Skin cancer screening performed today. Return in about 4 months (around 08/24/2022) for ak followup. I, Ruthell Rummage, CMA, am acting as scribe for Forest Gleason, MD.   Documentation: I have reviewed the above documentation for accuracy and completeness, and I agree  with the above.  Forest Gleason, MD'

## 2022-04-28 ENCOUNTER — Encounter: Payer: Self-pay | Admitting: Dermatology

## 2022-05-01 ENCOUNTER — Telehealth: Payer: Self-pay

## 2022-05-01 NOTE — Telephone Encounter (Signed)
Left message for patient advising pathology at right chest showed benign keratosis, no treatment needed. Patient to call with any questions or concerns.  Lurlean Horns., RMA

## 2022-05-01 NOTE — Telephone Encounter (Signed)
-----   Message from Alfonso Patten, MD sent at 04/30/2022  6:33 PM EST ----- Skin , right chest SEBORRHEIC KERATOSIS, INFLAMED  This is a benign growth or "wisdom spot". No additional treatment is needed.    MAs please call. Thank you!

## 2022-05-04 DIAGNOSIS — H6693 Otitis media, unspecified, bilateral: Secondary | ICD-10-CM | POA: Diagnosis not present

## 2022-05-04 DIAGNOSIS — R062 Wheezing: Secondary | ICD-10-CM | POA: Diagnosis not present

## 2022-05-04 DIAGNOSIS — Z20822 Contact with and (suspected) exposure to covid-19: Secondary | ICD-10-CM | POA: Diagnosis not present

## 2022-05-04 DIAGNOSIS — R509 Fever, unspecified: Secondary | ICD-10-CM | POA: Diagnosis not present

## 2022-05-24 DIAGNOSIS — M9905 Segmental and somatic dysfunction of pelvic region: Secondary | ICD-10-CM | POA: Diagnosis not present

## 2022-05-24 DIAGNOSIS — M5136 Other intervertebral disc degeneration, lumbar region: Secondary | ICD-10-CM | POA: Diagnosis not present

## 2022-05-24 DIAGNOSIS — M9903 Segmental and somatic dysfunction of lumbar region: Secondary | ICD-10-CM | POA: Diagnosis not present

## 2022-05-24 DIAGNOSIS — M5416 Radiculopathy, lumbar region: Secondary | ICD-10-CM | POA: Diagnosis not present

## 2022-05-29 DIAGNOSIS — H02834 Dermatochalasis of left upper eyelid: Secondary | ICD-10-CM | POA: Diagnosis not present

## 2022-05-29 DIAGNOSIS — H2513 Age-related nuclear cataract, bilateral: Secondary | ICD-10-CM | POA: Diagnosis not present

## 2022-05-29 DIAGNOSIS — H02831 Dermatochalasis of right upper eyelid: Secondary | ICD-10-CM | POA: Diagnosis not present

## 2022-06-05 NOTE — Progress Notes (Unsigned)
    Mark Martinez T. Mark Parlato, MD, [place] at [facility] [place], [address]  Phone: [phone]  FAX: [place] - 71 y.o. male  MRN [id number]  Date of Birth: 07-25-1950  Date: 06/06/2022  PCP: Ria Bush, MD  Referral: Ria Bush, MD  No chief complaint on file.  Subjective:   Mark Martinez is a 71 y.o. very pleasant male patient with There is no height or weight on file to calculate BMI. who presents with the following:  Pleasant gentleman at age 23 years old who presents with a question about a hernia.    Review of Systems is noted in the HPI, as appropriate  Objective:   There were no vitals taken for this visit.  GEN: No acute distress; alert,appropriate. PULM: Breathing comfortably in no respiratory distress PSYCH: Normally interactive.   Laboratory and Imaging Data:  Assessment and Plan:   ***

## 2022-06-06 ENCOUNTER — Ambulatory Visit (INDEPENDENT_AMBULATORY_CARE_PROVIDER_SITE_OTHER): Payer: Medicare HMO | Admitting: Family Medicine

## 2022-06-06 ENCOUNTER — Encounter: Payer: Self-pay | Admitting: Family Medicine

## 2022-06-06 VITALS — BP 90/60 | HR 81 | Temp 98.1°F | Ht 72.0 in | Wt 218.2 lb

## 2022-06-06 DIAGNOSIS — R1013 Epigastric pain: Secondary | ICD-10-CM | POA: Diagnosis not present

## 2022-06-06 NOTE — Patient Instructions (Signed)
Pepcid AC - scheduled, 1 tablet twice a day for the next 2-3 weeks

## 2022-06-07 LAB — HEPATIC FUNCTION PANEL
ALT: 23 U/L (ref 0–53)
AST: 22 U/L (ref 0–37)
Albumin: 4.1 g/dL (ref 3.5–5.2)
Alkaline Phosphatase: 100 U/L (ref 39–117)
Bilirubin, Direct: 0.1 mg/dL (ref 0.0–0.3)
Total Bilirubin: 0.5 mg/dL (ref 0.2–1.2)
Total Protein: 6.8 g/dL (ref 6.0–8.3)

## 2022-06-07 LAB — BASIC METABOLIC PANEL
BUN: 19 mg/dL (ref 6–23)
CO2: 30 mEq/L (ref 19–32)
Calcium: 9.1 mg/dL (ref 8.4–10.5)
Chloride: 102 mEq/L (ref 96–112)
Creatinine, Ser: 1.12 mg/dL (ref 0.40–1.50)
GFR: 66.74 mL/min (ref >60.00)
Glucose, Bld: 93 mg/dL (ref 70–99)
Potassium: 5 mEq/L (ref 3.5–5.1)
Sodium: 138 mEq/L (ref 135–145)

## 2022-06-07 LAB — CBC WITH DIFFERENTIAL/PLATELET
Basophils Absolute: 0.1 10*3/uL (ref 0.0–0.1)
Basophils Relative: 0.9 % (ref 0.0–3.0)
Eosinophils Absolute: 0.2 10*3/uL (ref 0.0–0.7)
Eosinophils Relative: 3.7 % (ref 0.0–5.0)
HCT: 48.9 % (ref 39.0–52.0)
Hemoglobin: 16.6 g/dL (ref 13.0–17.0)
Lymphocytes Relative: 30.4 % (ref 12.0–46.0)
Lymphs Abs: 1.9 10*3/uL (ref 0.7–4.0)
MCHC: 34 g/dL (ref 30.0–36.0)
MCV: 92.5 fl (ref 78.0–100.0)
Monocytes Absolute: 0.4 10*3/uL (ref 0.1–1.0)
Monocytes Relative: 6.2 % (ref 3.0–12.0)
Neutro Abs: 3.6 10*3/uL (ref 1.4–7.7)
Neutrophils Relative %: 58.8 % (ref 43.0–77.0)
Platelets: 281 10*3/uL (ref 150.0–400.0)
RBC: 5.28 Mil/uL (ref 4.22–5.81)
RDW: 13.4 % (ref 11.5–15.5)
WBC: 6.1 10*3/uL (ref 4.0–10.5)

## 2022-06-07 LAB — LIPASE: Lipase: 24 U/L (ref 11.0–59.0)

## 2022-06-07 LAB — H. PYLORI ANTIBODY, IGG: H Pylori IgG: NEGATIVE

## 2022-06-13 ENCOUNTER — Other Ambulatory Visit: Payer: Self-pay | Admitting: Family Medicine

## 2022-06-21 ENCOUNTER — Ambulatory Visit (INDEPENDENT_AMBULATORY_CARE_PROVIDER_SITE_OTHER): Payer: Medicare HMO

## 2022-06-21 VITALS — Wt 218.0 lb

## 2022-06-21 DIAGNOSIS — Z0000 Encounter for general adult medical examination without abnormal findings: Secondary | ICD-10-CM

## 2022-06-21 NOTE — Progress Notes (Signed)
I connected with  Mark Martinez on 06/21/22 by a audio enabled telemedicine application and verified that I am speaking with the correct person using two identifiers.  Patient Location: Home  Provider Location: Office/Clinic  I discussed the limitations of evaluation and management by telemedicine. The patient expressed understanding and agreed to proceed.   Subjective:   Mark Martinez is a 72 y.o. male who presents for Medicare Annual/Subsequent preventive examination.  Review of Systems     Cardiac Risk Factors include: advanced age (>24mn, >>25women);dyslipidemia;male gender     Objective:    Today's Vitals   06/21/22 0859  Weight: 218 lb (98.9 kg)   Body mass index is 29.57 kg/m.     06/21/2022    9:04 AM 06/26/2021    9:31 AM 06/20/2021    9:44 AM 06/16/2021   11:59 AM 06/15/2020   12:01 PM 05/10/2020    8:00 PM 05/27/2019    9:20 PM  Advanced Directives  Does Patient Have a Medical Advance Directive? Yes Yes  Yes Yes Yes Yes  Type of [place]aramed[place]of [place]err[place] will Living will Living will [place] Terre[place] will [place] will Living will Living will  Does patient want to make changes to medical advance directive?    Yes (MAU/Ambulatory/Procedural Areas - Information given)  No - Patient declined   Copy of [place] Chart? No - copy requested  No - copy requested  No - copy requested      Current Medications (verified) Outpatient Encounter Medications as of 06/21/2022  Medication Sig   acetaminophen (TYLENOL) 325 MG tablet Take 650 mg by mouth every 6 (six) hours as needed for moderate pain.    cetirizine (ZYRTEC) 10 MG tablet Take 1 tablet (10 mg total) by mouth daily.   cholestyramine (QUESTRAN) 4 g packet Take 4 g by mouth 2 (two) times daily.   esomeprazole (NEXIUM) 40 MG capsule Take 1 capsule (40 mg total) by mouth daily at 12 noon.   famotidine (PEPCID) 20 MG tablet  Take 20 mg by mouth in the morning and at bedtime.   Misc Natural Products (OSTEO BI-FLEX JOINT SHIELD PO) Take by mouth daily.   POTASSIUM PO Take by mouth daily.   sildenafil (REVATIO) 20 MG tablet TAKE 3-5 TABLET BY MOUTH DAILY AS NEEDED   No facility-administered encounter medications on file as of 06/21/2022.    Allergies (verified) Codeine   History: Past Medical History:  Diagnosis Date   Cataract    COVID-19 virus infection 06/2019   ED (erectile dysfunction)    Elevated LFTs    Esophageal stricture    GERD (gastroesophageal reflux disease)    with hiatal hernia   H/O hiatal hernia    History of prostatitis    HLD (hyperlipidemia)    Prediabetes 07/28/2015   Shortness of breath    exersion   Tubular adenoma of colon    Past Surgical History:  Procedure Laterality Date   BACK SURGERY  1994   lumbar area   CHOLECYSTECTOMY     COLONOSCOPY  2005   WNL, rpt 10 yrs   COLONOSCOPY  07/2019   1 TA, hemorrhoids, rpt 7 yrs ([name]   CYST REMOVAL NECK Left 08/31/2013   Procedure: EXCISION EPIDERMAL INCLUSION CYST LEFT NECK;  Surgeon: BZenovia Jarred MD   ESOPHAGEAL DILATION     three last   ESOPHAGOGASTRODUODENOSCOPY  12-27-1996   hiatal hernia (Dr. PSharlett Iles  SHOULDER ARTHROSCOPY WITH ROTATOR CUFF REPAIR AND SUBACROMIAL DECOMPRESSION Right 01/2020   distal clavicle resection, RTC repair ([place])   Family History  Problem Relation Age of Onset   Valvular heart disease Mother    Ulcers Father    CAD Father 84       multiple stents   CAD Paternal Uncle    Cancer Neg Hx    Diabetes Neg Hx    Stroke Neg Hx    Colon cancer Neg Hx    Esophageal cancer Neg Hx    Pancreatic cancer Neg Hx    Liver disease Neg Hx    Stomach cancer Neg Hx    Social History   Socioeconomic History   Marital status: Married    Spouse name: Not on file   Number of children: 3   Years of education: Not on file   Highest education level: Not on file  Occupational History    Occupation: seeding and landscaping services    Employer: OTHER  Tobacco Use   Smoking status: Never    Passive exposure: Never   Smokeless tobacco: Former    Quit date: 01/30/2009  Vaping Use   Vaping Use: Never used  Substance and Sexual Activity   Alcohol use: No   Drug use: No   Sexual activity: Not Currently  Other Topics Concern   Not on file  Social History Narrative   Caffeine: rare   Lives with wife, 1 dog.   Grown children   Occupation: Works in Biomedical scientist.   Activity: stays active at work   Diet: daily fruits/vegetables, some water   Social Determinants of Health   Financial Resource Strain: Low Risk  (06/21/2022)   Overall Financial Resource Strain (CARDIA)    Difficulty of Paying Living Expenses: Not hard at all  Food Insecurity: No Food Insecurity (06/21/2022)   Hunger Vital Sign    Worried About Running Out of Food in the Last Year: Never true    Ran Out of Food in the Last Year: Never true  Transportation Needs: No Transportation Needs (06/21/2022)   PRAPARE - Hydrologist (Medical): No    Lack of Transportation (Non-Medical): No  Physical Activity: Inactive (06/21/2022)   Exercise Vital Sign    Days of Exercise per Week: 0 days    Minutes of Exercise per Session: 0 min  Stress: No Stress Concern Present (06/21/2022)   [place]    Feeling of Stress : Not at all  Social Connections: [place] (06/21/2022)   Social Connection and Isolation Panel [NHANES]    Frequency of Communication with Friends and Family: More than three times a week    Frequency of Social Gatherings with Friends and Family: More than three times a week    Attends Religious Services: More than 4 times per year    Active Member of [employer] or Organizations: Yes    Attends Archivist Meetings: 1 to 4 times per year    Marital Status: Married    Tobacco Counseling Counseling given:  Not Answered   Clinical Intake:  Pre-visit preparation completed: Yes  Pain : No/denies pain     BMI - recorded: 29.57 Nutritional Status: BMI 25 -29 Overweight Nutritional Risks: None Diabetes: No  How often do you need to have someone help you when you read instructions, pamphlets, or other written materials from your doctor or pharmacy?: 1 - Never  Diabetic?no  Interpreter Needed?:  No  Information entered by :: Charlott Rakes, LPN   Activities of Daily Living    06/21/2022    9:04 AM  In your present state of health, do you have any difficulty performing the following activities:  Hearing? 1  Comment tinnitus  Vision? 0  Difficulty concentrating or making decisions? 0  Walking or climbing stairs? 0  Dressing or bathing? 0  Doing errands, shopping? 0  Preparing Food and eating ? N  Using the Toilet? N  In the past six months, have you accidently leaked urine? N  Do you have problems with loss of bowel control? N  Managing your Medications? N  Managing your Finances? N  Housekeeping or managing your Housekeeping? N    Patient Care Team: Ria Bush, MD as PCP - General  Indicate any recent Medical Services you may have received from other than [facility] providers in the past year (date may be approximate).     Assessment:   This is a routine wellness examination for [place].  Hearing/Vision screen Hearing Screening - Comments:: Pt has tinnitus  Vision Screening - Comments:: Pt follows up with [facility] for annual eye exams   Dietary issues and exercise activities discussed: Current Exercise Habits: The patient has a physically strenuous job, but has no regular exercise apart from work.   Goals Addressed             This Visit's Progress    Patient Stated       Stay active and healthy       Depression Screen    06/21/2022    9:02 AM 06/16/2021   12:02 PM 06/15/2020   12:03 PM 04/22/2019   10:40 AM 04/15/2018   10:38 AM 04/01/2017    8:46  AM  PHQ 2/9 Scores  PHQ - 2 Score 0 0 0 0 0 0  PHQ- 9 Score   0       Fall Risk    06/21/2022    9:04 AM 07/05/2021   11:13 AM 07/05/2021   11:10 AM 06/16/2021   12:01 PM 06/15/2020   12:02 PM  [place] in the past year? 0 0 0 0 0  Number falls in past yr: 0   0 0  Injury with Fall? 0   0 0  Risk for fall due to : Impaired vision   No Fall Risks No Fall Risks  Follow up Falls prevention discussed   Falls prevention discussed Falls evaluation completed;Falls prevention discussed    FALL RISK PREVENTION PERTAINING TO THE HOME:  Any stairs in or around the home? Yes  If so, are there any without handrails? No  Home free of loose throw rugs in walkways, pet beds, electrical cords, etc? Yes  Adequate lighting in your home to reduce risk of falls? Yes   ASSISTIVE DEVICES UTILIZED TO PREVENT FALLS:  Life alert? No  Use of a cane, walker or w/c? No  Grab bars in the bathroom? No  Shower chair or bench in shower? No  Elevated toilet seat or a handicapped toilet? No   TIMED UP AND GO:  Was the test performed? No .    Cognitive Function:    06/15/2020   12:04 PM  MMSE - Mini Mental State Exam  Orientation to time 5  Orientation to Place 5  Registration 3  Attention/ Calculation 5  Recall 3  Language- repeat 1        06/21/2022    9:05  AM  6CIT Screen  What Year? 0 points  What month? 0 points  What time? 0 points  Count back from 20 0 points  Months in reverse 0 points  Repeat phrase 0 points  Total Score 0 points    Immunizations Immunization History  Administered Date(s) Administered   Covid-19, Mrna,Vaccine(Spikevax)35yr and older 04/08/2022   Fluad Quad(high Dose 65+) 03/12/2019, 04/10/2020, 07/19/2021   Influenza Whole 05/19/2009   Influenza, High Dose Seasonal PF 06/04/2017, 03/21/2018, 04/08/2022   Influenza, Seasonal, Injecte, Preservative Fre 06/20/2012   Influenza,inj,Quad PF,6+ Mos 03/08/2015, 04/18/2016   Influenza,inj,quad, With  Preservative 03/19/2019, 04/06/2020   Influenza-Unspecified 04/14/2013   PFIZER(Purple Top)SARS-COV-2 Vaccination 09/17/2019, 10/17/2019   PNEUMOCOCCAL CONJUGATE-20 07/19/2021   Pneumococcal Polysaccharide-23 04/22/2019   Td 07/07/2002   Tdap 06/20/2012, 05/27/2019    TDAP status: Up to date  Flu Vaccine status: Up to date  Pneumococcal vaccine status: Up to date  Covid-19 vaccine status: Completed vaccines  Qualifies for Shingles Vaccine? Yes   Zostavax completed No   Shingrix Completed?: No.    Education has been provided regarding the importance of this vaccine. Patient has been advised to call insurance company to determine out of pocket expense if they have not yet received this vaccine. Advised may also receive vaccine at local pharmacy or Health Dept. Verbalized acceptance and understanding.  Screening Tests Health Maintenance  Topic Date Due   Zoster Vaccines- Shingrix (1 of 2) Never done   COVID-19 Vaccine (4 - 2023-24 season) 06/03/2022   Medicare Annual Wellness (AWV)  06/22/2023   COLONOSCOPY (Pts 45-43yrInsurance coverage will need to be confirmed)  07/28/2026   DTaP/Tdap/Td (4 - Td or Tdap) 05/26/2029   Pneumonia Vaccine 6535Years old  Completed   INFLUENZA VACCINE  Completed   Hepatitis C Screening  Completed   HPV VACCINES  Aged Out    Health Maintenance  Health Maintenance Due  Topic Date Due   Zoster Vaccines- Shingrix (1 of 2) Never done   COVID-19 Vaccine (4 - 2023-24 season) 06/03/2022    Colorectal cancer screening: Type of screening: Colonoscopy. Completed 07/29/19. Repeat every 7 years  Additional Screening:  Hepatitis C Screening:  Completed 09/09/20  Vision Screening: Recommended annual ophthalmology exams for early detection of glaucoma and other disorders of the eye. Is the patient up to date with their annual eye exam?  Yes  Who is the provider or what is the name of the office in which the patient attends annual eye exams? [facility] is not established with a provider, would they like to be referred to a provider to establish care? No .   Dental Screening: Recommended annual dental exams for proper oral hygiene  Community Resource Referral / Chronic Care Management: CRR required this visit?  No   CCM required this visit?  No      Plan:     I have personally reviewed and noted the following in the patient's chart:   Medical and social history Use of alcohol, tobacco or illicit drugs  Current medications and supplements including opioid prescriptions. Patient is not currently taking opioid prescriptions. Functional ability and status Nutritional status Physical activity Advanced directives List of other physicians Hospitalizations, surgeries, and ER visits in previous 12 months Vitals Screenings to include cognitive, depression, and falls Referrals and appointments  In addition, I have reviewed and discussed with patient certain preventive protocols, quality metrics, and best practice recommendations. A written personalized care plan for preventive services as well  as general preventive health recommendations were provided to patient.     Willette Brace, LPN   09/21/386   Nurse Notes: none

## 2022-06-21 NOTE — Patient Instructions (Signed)
Mark Martinez , Thank you for taking time to come for your Medicare Wellness Visit. I appreciate your ongoing commitment to your health goals. Please review the following plan we discussed and let me know if I can assist you in the future.   These are the goals we discussed:  Goals      Patient Stated     06/15/2020, I will maintain and continue medications as prescribed.      Patient Stated     Would like to maintain current routine     Patient Stated     Stay active and healthy        This is a list of the screening recommended for you and due dates:  Health Maintenance  Topic Date Due   Zoster (Shingles) Vaccine (1 of 2) Never done   COVID-19 Vaccine (4 - 2023-24 season) 06/03/2022   Medicare Annual Wellness Visit  06/22/2023   Colon Cancer Screening  07/28/2026   DTaP/Tdap/Td vaccine (4 - Td or Tdap) 05/26/2029   Pneumonia Vaccine  Completed   Flu Shot  Completed   Hepatitis C Screening: USPSTF Recommendation to screen - Ages 18-79 yo.  Completed   HPV Vaccine  Aged Out    Advanced directives: Please bring a copy of your health care power of attorney and living will to the office at your convenience.  Conditions/risks identified: stay healthy and active   Next appointment: Follow up in one year for your annual wellness visit.   Preventive Care 17 Years and Older, Male  Preventive care refers to lifestyle choices and visits with your health care provider that can promote health and wellness. What does preventive care include? A yearly physical exam. This is also called an annual well check. Dental exams once or twice a year. Routine eye exams. Ask your health care provider how often you should have your eyes checked. Personal lifestyle choices, including: Daily care of your teeth and gums. Regular physical activity. Eating a healthy diet. Avoiding tobacco and drug use. Limiting alcohol use. Practicing safe sex. Taking low doses of aspirin every day. Taking vitamin  and mineral supplements as recommended by your health care provider. What happens during an annual well check? The services and screenings done by your health care provider during your annual well check will depend on your age, overall health, lifestyle risk factors, and family history of disease. Counseling  Your health care provider may ask you questions about your: Alcohol use. Tobacco use. Drug use. Emotional well-being. Home and relationship well-being. Sexual activity. Eating habits. History of falls. Memory and ability to understand (cognition). Work and work Statistician. Screening  You may have the following tests or measurements: Height, weight, and BMI. Blood pressure. Lipid and cholesterol levels. These may be checked every 5 years, or more frequently if you are over 42 years old. Skin check. Lung cancer screening. You may have this screening every year starting at age 42 if you have a 30-pack-year history of smoking and currently smoke or have quit within the past 15 years. Fecal occult blood test (FOBT) of the stool. You may have this test every year starting at age 80. Flexible sigmoidoscopy or colonoscopy. You may have a sigmoidoscopy every 5 years or a colonoscopy every 10 years starting at age 37. Prostate cancer screening. Recommendations will vary depending on your family history and other risks. Hepatitis C blood test. Hepatitis B blood test. Sexually transmitted disease (STD) testing. Diabetes screening. This is done by checking your blood  sugar (glucose) after you have not eaten for a while (fasting). You may have this done every 1-3 years. Abdominal aortic aneurysm (AAA) screening. You may need this if you are a current or former smoker. Osteoporosis. You may be screened starting at age 29 if you are at high risk. Talk with your health care provider about your test results, treatment options, and if necessary, the need for more tests. Vaccines  Your health care  provider may recommend certain vaccines, such as: Influenza vaccine. This is recommended every year. Tetanus, diphtheria, and acellular pertussis (Tdap, Td) vaccine. You may need a Td booster every 10 years. Zoster vaccine. You may need this after age 23. Pneumococcal 13-valent conjugate (PCV13) vaccine. One dose is recommended after age 44. Pneumococcal polysaccharide (PPSV23) vaccine. One dose is recommended after age 45. Talk to your health care provider about which screenings and vaccines you need and how often you need them. This information is not intended to replace advice given to you by your health care provider. Make sure you discuss any questions you have with your health care provider. Document Released: 07/01/2015 Document Revised: 02/22/2016 Document Reviewed: 04/05/2015 Elsevier Interactive Patient Education  2017 [place] Prevention in the Home Falls can cause injuries. They can happen to people of all ages. There are many things you can do to make your home safe and to help prevent falls. What can I do on the outside of my home? Regularly fix the edges of walkways and driveways and fix any cracks. Remove anything that might make you trip as you walk through a door, such as a raised step or threshold. Trim any bushes or trees on the path to your home. Use bright outdoor lighting. Clear any walking paths of anything that might make someone trip, such as rocks or tools. Regularly check to see if handrails are loose or broken. Make sure that both sides of any steps have handrails. Any raised decks and porches should have guardrails on the edges. Have any leaves, snow, or ice cleared regularly. Use sand or salt on walking paths during winter. Clean up any spills in your garage right away. This includes oil or grease spills. What can I do in the bathroom? Use night lights. Install grab bars by the toilet and in the tub and shower. Do not use towel bars as grab  bars. Use non-skid mats or decals in the tub or shower. If you need to sit down in the shower, use a plastic, non-slip stool. Keep the floor dry. Clean up any water that spills on the floor as soon as it happens. Remove soap buildup in the tub or shower regularly. Attach bath mats securely with double-sided non-slip rug tape. Do not have throw rugs and other things on the floor that can make you trip. What can I do in the bedroom? Use night lights. Make sure that you have a light by your bed that is easy to reach. Do not use any sheets or blankets that are too big for your bed. They should not hang down onto the floor. Have a firm chair that has side arms. You can use this for support while you get dressed. Do not have throw rugs and other things on the floor that can make you trip. What can I do in the kitchen? Clean up any spills right away. Avoid walking on wet floors. Keep items that you use a lot in easy-to-reach places. If you need to reach something above you,  use a strong step stool that has a grab bar. Keep electrical cords out of the way. Do not use floor polish or wax that makes floors slippery. If you must use wax, use non-skid floor wax. Do not have throw rugs and other things on the floor that can make you trip. What can I do with my stairs? Do not leave any items on the stairs. Make sure that there are handrails on both sides of the stairs and use them. Fix handrails that are broken or loose. Make sure that handrails are as long as the stairways. Check any carpeting to make sure that it is firmly attached to the stairs. Fix any carpet that is loose or worn. Avoid having throw rugs at the top or bottom of the stairs. If you do have throw rugs, attach them to the floor with carpet tape. Make sure that you have a light switch at the top of the stairs and the bottom of the stairs. If you do not have them, ask someone to add them for you. What else can I do to help prevent  falls? Wear shoes that: Do not have high heels. Have rubber bottoms. Are comfortable and fit you well. Are closed at the toe. Do not wear sandals. If you use a stepladder: Make sure that it is fully opened. Do not climb a closed stepladder. Make sure that both sides of the stepladder are locked into place. Ask someone to hold it for you, if possible. Clearly mark and make sure that you can see: Any grab bars or handrails. First and last steps. Where the edge of each step is. Use tools that help you move around (mobility aids) if they are needed. These include: Canes. Walkers. Scooters. Crutches. Turn on the lights when you go into a dark area. Replace any light bulbs as soon as they burn out. Set up your furniture so you have a clear path. Avoid moving your furniture around. If any of your floors are uneven, fix them. If there are any pets around you, be aware of where they are. Review your medicines with your doctor. Some medicines can make you feel dizzy. This can increase your chance of falling. Ask your doctor what other things that you can do to help prevent falls. This information is not intended to replace advice given to you by your health care provider. Make sure you discuss any questions you have with your health care provider. Document Released: 03/31/2009 Document Revised: 11/10/2015 Document Reviewed: 07/09/2014 Elsevier Interactive Patient Education  2017 [employer].

## 2022-06-22 DIAGNOSIS — M5416 Radiculopathy, lumbar region: Secondary | ICD-10-CM | POA: Diagnosis not present

## 2022-06-22 DIAGNOSIS — M9903 Segmental and somatic dysfunction of lumbar region: Secondary | ICD-10-CM | POA: Diagnosis not present

## 2022-06-22 DIAGNOSIS — M5136 Other intervertebral disc degeneration, lumbar region: Secondary | ICD-10-CM | POA: Diagnosis not present

## 2022-06-22 DIAGNOSIS — M9905 Segmental and somatic dysfunction of pelvic region: Secondary | ICD-10-CM | POA: Diagnosis not present

## 2022-07-05 ENCOUNTER — Encounter: Payer: Self-pay | Admitting: Dermatology

## 2022-07-05 ENCOUNTER — Ambulatory Visit: Payer: Medicare HMO | Admitting: Dermatology

## 2022-07-05 VITALS — BP 125/76 | HR 69

## 2022-07-05 DIAGNOSIS — L308 Other specified dermatitis: Secondary | ICD-10-CM

## 2022-07-05 MED ORDER — CLOBETASOL PROPIONATE 0.05 % EX OINT: 1.0000 | TOPICAL_OINTMENT | CUTANEOUS | 1 refills | Status: DC

## 2022-07-05 NOTE — Progress Notes (Signed)
   Follow-Up Visit   Subjective  Mark Martinez is a 72 y.o. male who presents for the following: itchy spots (Legs, feet, chest, arms, ~39m otc Cortisone cr, tried TMC 0.1% oint to one spot on ankle).  The following portions of the chart were reviewed this encounter and updated as appropriate:   Tobacco  Allergies  Meds  Problems  Med Hx  Surg Hx  Fam Hx      Review of Systems:  No other skin or systemic complaints except as noted in HPI or Assessment and Plan.  Objective  Well appearing patient in no apparent distress; mood and affect are within normal limits.  A focused examination was performed including arms, legs, chest. Relevant physical exam findings are noted in the Assessment and Plan.  arms, legs, chest Multiple scaly pink paps and nummular plaques and scaly pink papules coalescing to plaques     Assessment & Plan  Other eczema arms, legs, chest  Chronic and persistent condition with duration or expected duration over one year. Condition is bothersome/symptomatic for patient. Currently flared.  KOH negative for fungal elements  Recommend Dove for sensitive skin, d/c Dial soap Start Clobetasol oint bid aa rash up to 3 weeks then weekends only, prn flares, avoid f/g/a  Discussed bx if not clearing with topical treatment  Topical steroids (such as triamcinolone, fluocinolone, fluocinonide, mometasone, clobetasol, halobetasol, betamethasone, hydrocortisone) can cause thinning and lightening of the skin if they are used for too long in the same area. Your physician has selected the right strength medicine for your problem and area affected on the body. Please use your medication only as directed by your physician to prevent side effects.    Atopic dermatitis (eczema) is a chronic, relapsing, pruritic condition that can significantly affect quality of life. It is often associated with allergic rhinitis and/or asthma and can require treatment with topical medications,  phototherapy, or in severe cases biologic injectable medication (Dupixent; Adbry) or Oral JAK inhibitors.   clobetasol ointment (TEMOVATE) 0.05 % - arms, legs, chest Apply 1 Application topically as directed. bid aa itchy rash on body up to 3 weeks, then weekends only, prn flares, avoid face, groin, axilla   Return in about 6 weeks (around 08/16/2022) for f/u Eczema and AKs.  I, SOthelia Pulling RMA, am acting as scribe for VForest Gleason MD .  Documentation: I have reviewed the above documentation for accuracy and completeness, and I agree with the above.  VForest Gleason MD

## 2022-07-05 NOTE — Patient Instructions (Addendum)
Recommend OTC [place] Rapid Relief Anti-Itch cream (pramoxine + menthol), CeraVe Anti-itch cream or lotion (pramoxine), Sarna lotion (Original- menthol + camphor or Sensitive- pramoxine) or Eucerin 12 hour Itch Relief lotion (menthol) up to 3 times per day to areas on body that are itchy.    Gentle Skin Care Guide  1. Bathe no more than once a day.  2. Avoid bathing in hot water  3. Use a mild soap like Dove, Vanicream, Cetaphil, CeraVe. Can use Lever 2000 or Cetaphil antibacterial soap  4. Use soap only where you need it. On most days, use it under your arms, between your legs, and on your feet. Let the water rinse other areas unless visibly dirty.  5. When you get out of the bath/shower, use a towel to gently blot your skin dry, don't rub it.  6. While your skin is still a little damp, apply a moisturizing cream such as Vanicream, CeraVe, Cetaphil, Eucerin, Sarna lotion or plain Vaseline Jelly. For hands apply Neutrogena [place] Hand Cream or Excipial Hand Cream.  7. Reapply moisturizer any time you start to itch or feel dry.  8. Sometimes using free and clear laundry detergents can be helpful. Fabric softener sheets should be avoided. Downy Free & Gentle liquid, or any liquid fabric softener that is free of dyes and perfumes, it acceptable to use  9. If your doctor has given you prescription creams you may apply moisturizers over them     Due to recent changes in healthcare laws, you may see results of your pathology and/or laboratory studies on MyChart before the doctors have had a chance to review them. We understand that in some cases there may be results that are confusing or concerning to you. Please understand that not all results are received at the same time and often the doctors may need to interpret multiple results in order to provide you with the best plan of care or course of treatment. Therefore, we ask that you please give [place] 2 business days to thoroughly review all  your results before contacting the office for clarification. Should we see a critical lab result, you will be contacted sooner.   If You Need Anything After Your Visit  If you have any questions or concerns for your doctor, please call our main line at [phone] and press option 4 to reach your doctor's medical assistant. If no one answers, please leave a voicemail as directed and we will return your call as soon as possible. Messages left after 4 pm will be answered the following business day.   You may also send [place] a message via [place]. We typically respond to MyChart messages within 1-2 business days.  For prescription refills, please ask your pharmacy to contact our office. Our fax number is [phone].  If you have an urgent issue when the clinic is closed that cannot wait until the next business day, you can page your doctor at the number below.    Please note that while we do our best to be available for urgent issues outside of office hours, we are not available 24/7.   If you have an urgent issue and are unable to reach [place], you may choose to seek medical care at your doctor's office, retail clinic, urgent care center, or emergency room.  If you have a medical emergency, please immediately call 911 or go to the emergency department.  Pager Numbers  - Dr. Nehemiah Massed: [phone]  - Dr. Laurence Ferrari: [phone]  - Dr. Nicole Kindred:  [phone]  In the event of inclement weather, please call our main line at [phone] for an update on the status of any delays or closures.  Dermatology Medication Tips: Please keep the boxes that topical medications come in in order to help keep track of the instructions about where and how to use these. Pharmacies typically print the medication instructions only on the boxes and not directly on the medication tubes.   If your medication is too expensive, please contact our office at [phone] option 4 or send [place] a message through [place].   We  are unable to tell what your co-pay for medications will be in advance as this is different depending on your insurance coverage. However, we may be able to find a substitute medication at lower cost or fill out paperwork to get insurance to cover a needed medication.   If a prior authorization is required to get your medication covered by your insurance company, please allow [place] 1-2 business days to complete this process.  Drug prices often vary depending on where the prescription is filled and some pharmacies may offer cheaper prices.  The website www.goodrx.com contains coupons for medications through different pharmacies. The prices here do not account for what the cost may be with help from insurance (it may be cheaper with your insurance), but the website can give you the price if you did not use any insurance.  - You can print the associated coupon and take it with your prescription to the pharmacy.  - You may also stop by our office during regular business hours and pick up a GoodRx coupon card.  - If you need your prescription sent electronically to a different pharmacy, notify our office through [facility] or by phone at [phone] option 4.     Si Usted Necesita Algo Despus de Su Visita  Tambin puede enviarnos un mensaje a travs de Pharmacist, community. Por lo general respondemos a los mensajes de MyChart en el transcurso de 1 a 2 das hbiles.  Para renovar recetas, por favor pida a su farmacia que se ponga en contacto con nuestra oficina. Harland Dingwall de fax es [place] [phone].  Si tiene un asunto urgente cuando la clnica est cerrada y que no puede esperar hasta el siguiente da hbil, puede llamar/localizar a su doctor(a) al nmero que aparece a continuacin.   Por favor, tenga en cuenta que aunque hacemos todo lo posible para estar disponibles para asuntos urgentes fuera del horario de [place], no estamos disponibles las 24 horas del da, los 7 das de la [place].   Si tiene  un problema urgente y no puede comunicarse con nosotros, puede optar por buscar atencin mdica  en el consultorio de su doctor(a), en una clnica privada, en un centro de atencin urgente o en una sala de emergencias.  Si tiene Engineering geologist, por favor llame inmediatamente al 911 o vaya a la sala de emergencias.  Nmeros de bper  - Dr. Nehemiah Massed: [phone]  - Dra. Moye: [phone]  - Dra. Nicole Kindred: [phone]  En caso de inclemencias del [place], por favor llame a Johnsie Kindred principal al [phone] para una actualizacin sobre el [place] de cualquier retraso o cierre.  Consejos para la medicacin en dermatologa: Por favor, guarde las cajas en las que vienen los medicamentos de uso tpico para ayudarle a seguir las instrucciones sobre dnde y cmo usarlos. Las farmacias generalmente imprimen las instrucciones del medicamento slo en las cajas y no directamente en los tubos del [place].  Si su medicamento es muy caro, por favor, pngase en contacto con Zigmund Daniel llamando al [phone] y presione la opcin 4 o envenos un mensaje a travs de Pharmacist, community.   No podemos decirle cul ser su copago por los medicamentos por adelantado ya que esto es diferente dependiendo de la cobertura de su seguro. Sin embargo, es posible que podamos encontrar un medicamento sustituto a Electrical engineer un formulario para que el seguro cubra el medicamento que se considera necesario.   Si se requiere una autorizacin previa para que su compaa de seguros [place] su medicamento, por favor permtanos de 1 a 2 das hbiles para completar este proceso.  Los precios de los medicamentos varan con frecuencia dependiendo del Environmental consultant de dnde se surte la receta y alguna farmacias pueden ofrecer precios ms baratos.  El sitio web www.goodrx.com tiene cupones para medicamentos de Airline pilot. Los precios aqu no tienen en cuenta lo que podra costar con la ayuda del seguro (puede ser  ms barato con su seguro), pero el sitio web puede darle el precio si no utiliz Research scientist (physical sciences).  - Puede imprimir el cupn correspondiente y llevarlo con su receta a la farmacia.  - Tambin puede pasar por nuestra oficina durante el horario de atencin regular y Charity fundraiser una tarjeta de cupones de GoodRx.  - Si necesita que su receta se enve electrnicamente a una farmacia diferente, informe a nuestra oficina a travs de MyChart de [place] o por telfono llamando al [phone] y presione la opcin 4.

## 2022-07-14 ENCOUNTER — Other Ambulatory Visit: Payer: Self-pay | Admitting: Family Medicine

## 2022-07-14 DIAGNOSIS — E611 Iron deficiency: Secondary | ICD-10-CM

## 2022-07-14 DIAGNOSIS — R7303 Prediabetes: Secondary | ICD-10-CM

## 2022-07-14 DIAGNOSIS — E785 Hyperlipidemia, unspecified: Secondary | ICD-10-CM

## 2022-07-14 DIAGNOSIS — R748 Abnormal levels of other serum enzymes: Secondary | ICD-10-CM

## 2022-07-14 DIAGNOSIS — K750 Abscess of liver: Secondary | ICD-10-CM

## 2022-07-14 DIAGNOSIS — Z125 Encounter for screening for malignant neoplasm of prostate: Secondary | ICD-10-CM

## 2022-07-17 ENCOUNTER — Other Ambulatory Visit (INDEPENDENT_AMBULATORY_CARE_PROVIDER_SITE_OTHER): Payer: Medicare HMO

## 2022-07-17 DIAGNOSIS — Z125 Encounter for screening for malignant neoplasm of prostate: Secondary | ICD-10-CM | POA: Diagnosis not present

## 2022-07-17 DIAGNOSIS — E611 Iron deficiency: Secondary | ICD-10-CM

## 2022-07-17 DIAGNOSIS — R7303 Prediabetes: Secondary | ICD-10-CM | POA: Diagnosis not present

## 2022-07-17 DIAGNOSIS — K750 Abscess of liver: Secondary | ICD-10-CM

## 2022-07-17 DIAGNOSIS — R748 Abnormal levels of other serum enzymes: Secondary | ICD-10-CM

## 2022-07-17 DIAGNOSIS — E785 Hyperlipidemia, unspecified: Secondary | ICD-10-CM | POA: Diagnosis not present

## 2022-07-17 LAB — COMPREHENSIVE METABOLIC PANEL
ALT: 21 U/L (ref 0–53)
AST: 18 U/L (ref 0–37)
Albumin: 4.2 g/dL (ref 3.5–5.2)
Alkaline Phosphatase: 105 U/L (ref 39–117)
BUN: 16 mg/dL (ref 6–23)
CO2: 29 mEq/L (ref 19–32)
Calcium: 9.3 mg/dL (ref 8.4–10.5)
Chloride: 101 mEq/L (ref 96–112)
Creatinine, Ser: 0.99 mg/dL (ref 0.40–1.50)
GFR: 77.33 mL/min (ref >60.00)
Glucose, Bld: 98 mg/dL (ref 70–99)
Potassium: 5 mEq/L (ref 3.5–5.1)
Sodium: 139 mEq/L (ref 135–145)
Total Bilirubin: 0.6 mg/dL (ref 0.2–1.2)
Total Protein: 7 g/dL (ref 6.0–8.3)

## 2022-07-17 LAB — PSA: PSA: 0.14 ng/mL (ref 0.10–4.00)

## 2022-07-17 LAB — LIPID PANEL
Cholesterol: 154 mg/dL (ref 0–200)
HDL: 52.3 mg/dL (ref >39.00)
LDL Cholesterol: 76 mg/dL (ref 0–99)
NonHDL: 102.07
Total CHOL/HDL Ratio: 3
Triglycerides: 128 mg/dL (ref 0.0–149.0)
VLDL: 25.6 mg/dL (ref 0.0–40.0)

## 2022-07-17 LAB — IBC PANEL
Iron: 101 ug/dL (ref 42–165)
Saturation Ratios: 21.8 % (ref 20.0–50.0)
TIBC: 463.4 ug/dL — ABNORMAL HIGH (ref 250.0–450.0)
Transferrin: 331 mg/dL (ref 212.0–360.0)

## 2022-07-17 LAB — HEMOGLOBIN A1C: Hgb A1c MFr Bld: 5.8 % (ref 4.6–6.5)

## 2022-07-17 LAB — FERRITIN: Ferritin: 30.8 ng/mL (ref 22.0–322.0)

## 2022-07-17 LAB — SEDIMENTATION RATE: Sed Rate: 14 mm/hr (ref 0–20)

## 2022-07-20 DIAGNOSIS — M9905 Segmental and somatic dysfunction of pelvic region: Secondary | ICD-10-CM | POA: Diagnosis not present

## 2022-07-20 DIAGNOSIS — M5136 Other intervertebral disc degeneration, lumbar region: Secondary | ICD-10-CM | POA: Diagnosis not present

## 2022-07-20 DIAGNOSIS — M9903 Segmental and somatic dysfunction of lumbar region: Secondary | ICD-10-CM | POA: Diagnosis not present

## 2022-07-20 DIAGNOSIS — M5416 Radiculopathy, lumbar region: Secondary | ICD-10-CM | POA: Diagnosis not present

## 2022-07-23 ENCOUNTER — Encounter: Payer: Self-pay | Admitting: Family Medicine

## 2022-07-23 ENCOUNTER — Ambulatory Visit (INDEPENDENT_AMBULATORY_CARE_PROVIDER_SITE_OTHER): Payer: Medicare HMO | Admitting: Family Medicine

## 2022-07-23 VITALS — BP 132/70 | HR 69 | Temp 97.6°F | Ht 71.5 in | Wt 212.0 lb

## 2022-07-23 DIAGNOSIS — K2100 Gastro-esophageal reflux disease with esophagitis, without bleeding: Secondary | ICD-10-CM | POA: Diagnosis not present

## 2022-07-23 DIAGNOSIS — K750 Abscess of liver: Secondary | ICD-10-CM

## 2022-07-23 DIAGNOSIS — E785 Hyperlipidemia, unspecified: Secondary | ICD-10-CM | POA: Diagnosis not present

## 2022-07-23 DIAGNOSIS — R7303 Prediabetes: Secondary | ICD-10-CM | POA: Diagnosis not present

## 2022-07-23 DIAGNOSIS — Z0000 Encounter for general adult medical examination without abnormal findings: Secondary | ICD-10-CM | POA: Diagnosis not present

## 2022-07-23 DIAGNOSIS — Z7189 Other specified counseling: Secondary | ICD-10-CM

## 2022-07-23 DIAGNOSIS — E611 Iron deficiency: Secondary | ICD-10-CM | POA: Diagnosis not present

## 2022-07-23 DIAGNOSIS — R748 Abnormal levels of other serum enzymes: Secondary | ICD-10-CM

## 2022-07-23 MED ORDER — ESOMEPRAZOLE MAGNESIUM 40 MG PO CPDR: 40.0000 mg | DELAYED_RELEASE_CAPSULE | Freq: Every day | ORAL | 4 refills | Status: DC

## 2022-07-23 MED ORDER — CHOLESTYRAMINE 4 G PO PACK: 4.0000 g | PACK | Freq: Two times a day (BID) | ORAL | 4 refills | Status: DC

## 2022-07-23 NOTE — Assessment & Plan Note (Signed)
S/p treatment appreciate gen surgery care. Recent labs reassuring.

## 2022-07-23 NOTE — Assessment & Plan Note (Signed)
Chronic, stable on cholestyramine. The 10-year ASCVD risk score (Arnett DK, et al., 2019) is: 16.4%   Values used to calculate the score:     Age: 72 years     Sex: Male     Is Non-Hispanic African American: No     Diabetic: No     Tobacco smoker: No     Systolic Blood Pressure: 641 mmHg     Is BP treated: No     HDL Cholesterol: 52.3 mg/dL     Total Cholesterol: 154 mg/dL

## 2022-07-23 NOTE — Assessment & Plan Note (Signed)
Preventative protocols reviewed and updated unless pt declined. Discussed healthy diet and lifestyle.  

## 2022-07-23 NOTE — Patient Instructions (Addendum)
Bring [place] copy of your advanced directive.  For gassiness- try Gas-X (simethicone). Avoid gas producing foods (beans, onions, celery, carrots, raisins, bananas, apricots, prunes, brussel sprouts, wheat germ, pretzels). Let us now if not improving with this + pepcid and regular nexium.  Good to see you today  Return as needed or in 1 year for next wellness visit/physical.

## 2022-07-23 NOTE — Assessment & Plan Note (Signed)
Chronic, overall stable period on nexium '40mg'$  daily, recent addition of pepcid '20mg'$  BID with benefit.

## 2022-07-23 NOTE — Assessment & Plan Note (Addendum)
Advanced directive discussion - has this at home. Wife would be HCPOA. Asked to bring [place] a copy. ok with CPR and compression and intubation but no prolonged life support.

## 2022-07-23 NOTE — Progress Notes (Addendum)
Patient ID: Mark Martinez, male    DOB: 1950-11-25, 72 y.o.   MRN: [id number]  This Martinez was conducted in person.  BP 132/70   Pulse 69   Temp 97.6 F (36.4 C) (Temporal)   Ht 5' 11.5" (1.816 m)   Wt 212 lb (96.2 kg)   SpO2 97%   BMI 29.16 kg/m    CC: CPE Subjective:   HPI: Mark Martinez is a 72 y.o. male presenting on 07/23/2022 for Annual Exam (MCR prt 2 [AWV- 06/21/22]. Needs sildenafil refill sent to [facility]. )   Saw health advisor last month for medicare wellness Martinez. Note reviewed.    No results found.  Mark Martinez from 07/23/2022 in [name]  PHQ-2 Total Score 0          07/23/2022    9:35 AM 06/21/2022    9:04 AM 07/05/2021   11:13 AM 07/05/2021   11:10 AM 06/16/2021   12:01 PM  Fall Risk   Falls in the past year? 0 0 0 0 0  Number falls in past yr:  0   0  Injury with Fall?  0   0  Risk for fall due to :  Impaired vision   No Fall Risks  Follow up  Falls prevention discussed   Falls prevention discussed   Saw chiropractor for chronic back pain last year.   04/2022 bronchitis/sinusitis treated by [name] with zpack/prednisone.  Since November having recurrence if epigastric pressure sensation with radiation up esophagus. Not really exacerbated by foods. H/o hiatal hernia. Can have a few episodes per year, but since 04/2022 having several episodes per week. Normally managed with rolaids, and bleching relieves discomfort. Saw Mark Martinez who prescribed pepcid with some benefit. H pylori IgG as well as other labs at that time negative. He does take nexium daily. He does avoid spicy foods, limits caffeine.  No dysphagia, early satiety or unexpected weight loss.  EGD 2022 - mild gastritis, small HH Mark Martinez)  Known liver abscess, no RUQ pain or fever or nausea/vomiting. Notes ongoing chronic intermittent RUQ discomfort but this is largely unchanged.   Incidentally discovered gallbladder fossa liver abscess/infection  treated with JP drain and cipro/flagyl 2022 - stopped cipro early due to lightheadedness while on it. He also had augmentin 7d course from ER 06/26/2021. He had robotic cholecystectomy 04/2020. Residual small fluid collection not thought to be abscess. Culture at that time returned negative for organisms. Saw Mark Mark Martinez.    Preventative: COLONOSCOPY 07/2019 - 1 TA, hemorrhoids, rpt 7 yrs Mark Martinez) Prostate - all normal checks in the past. Would like PSA but declines DRE.  AAA screen - not eligible  Lung cancer screen - not eligible  Flu shot yearly Mark Martinez 09/2019, 10/2019, booster 03/2022 Tdap 2014, 05/2019 Pneumovax 04/2019, prevnar-20 07/2021 RSV -  Shingrix - discussed, declines  Advanced directive discussion - has this at home. Wife would be HCPOA. Asked to bring [place] a copy. Ok with CPR and compression and intubation but no prolonged life support.  Seatbelt use discussed.  Sunscreen use discussed. No changing moles on skin. Saw derm - good report  Non smoker  Alcohol - none Dentist q6 mo  Eye exam yearly Bowel - no constipation - diarrhea controled with cholestyramine  Bladder - no incontinence   Caffeine: rare   Lives with wife, 1 dog.   Grown children   Occupation: Works in Biomedical scientist  Activity: stays active  at work also has total gym at home Diet: daily fruits/vegetables, some water, decreased sweets      Relevant past medical, surgical, family and social history reviewed and updated as indicated. Interim medical history since our last Martinez reviewed. Allergies and medications reviewed and updated. Outpatient Medications Prior to Martinez  Medication Sig Dispense Refill   acetaminophen (TYLENOL) 325 MG tablet Take 650 mg by mouth every 6 (six) hours as needed for moderate pain.      cetirizine (ZYRTEC) 10 MG tablet Take 1 tablet (10 mg total) by mouth daily. 30 tablet 0   clobetasol ointment (TEMOVATE) 1.09 % Apply 1 Application topically as directed. bid aa itchy rash  on body up to 3 weeks, then weekends only, prn flares, avoid face, groin, axilla 120 g 1   famotidine (PEPCID) 20 MG tablet Take 20 mg by mouth in the morning and at bedtime.     Misc Natural Products (OSTEO BI-FLEX JOINT SHIELD PO) Take by mouth daily.     POTASSIUM PO Take by mouth daily.     sildenafil (REVATIO) 20 MG tablet TAKE 3-5 TABLET BY MOUTH DAILY AS NEEDED 60 tablet 0   cholestyramine (QUESTRAN) 4 g packet Take 4 g by mouth 2 (two) times daily.     esomeprazole (NEXIUM) 40 MG capsule Take 1 capsule (40 mg total) by mouth daily at 12 noon. 90 capsule 3   No facility-administered medications prior to Martinez.     Per HPI unless specifically indicated in ROS section below Review of Systems  Constitutional:  Negative for activity change, appetite change, chills, fatigue, fever and unexpected weight change.  HENT:  Negative for hearing loss.   Eyes:  Negative for visual disturbance.  Respiratory:  Negative for cough, chest tightness, shortness of breath and wheezing.   Cardiovascular:  Negative for chest pain, palpitations and leg swelling.  Gastrointestinal:  Negative for abdominal distention, abdominal pain, blood in stool, constipation, diarrhea, nausea and vomiting.  Genitourinary:  Negative for difficulty urinating and hematuria.  Musculoskeletal:  Negative for arthralgias, myalgias and neck pain.  Skin:  Negative for rash.  Neurological:  Negative for dizziness, seizures, syncope and headaches.  Hematological:  Negative for adenopathy. Does not bruise/bleed easily.  Psychiatric/Behavioral:  Negative for dysphoric mood. The patient is not nervous/anxious.     Objective:  BP 132/70   Pulse 69   Temp 97.6 F (36.4 C) (Temporal)   Ht 5' 11.5" (1.816 m)   Wt 212 lb (96.2 kg)   SpO2 97%   BMI 29.16 kg/m   Wt Readings from Last 3 Encounters:  07/23/22 212 lb (96.2 kg)  06/21/22 218 lb (98.9 kg)  06/06/22 218 lb 4 oz (99 kg)      Physical Exam Vitals and nursing note  reviewed.  Constitutional:      General: He is not in acute distress.    Appearance: Normal appearance. He is well-developed. He is not ill-appearing.  HENT:     Head: Normocephalic and atraumatic.     Right Ear: Hearing, tympanic membrane, ear canal and external ear normal.     Left Ear: Hearing, tympanic membrane, ear canal and external ear normal.     Mouth/Throat:     Comments: Wearing mask Eyes:     General: No scleral icterus.    Extraocular Movements: Extraocular movements intact.     Conjunctiva/sclera: Conjunctivae normal.     Pupils: Pupils are equal, round, and reactive to light.  Neck:  Thyroid: No thyroid mass or thyromegaly.  Cardiovascular:     Rate and Rhythm: Normal rate and regular rhythm.     Pulses: Normal pulses.          Radial pulses are 2+ on the right side and 2+ on the left side.     Heart sounds: Normal heart sounds. No murmur heard. Pulmonary:     Effort: Pulmonary effort is normal. No respiratory distress.     Breath sounds: Normal breath sounds. No wheezing, rhonchi or rales.  Abdominal:     General: Bowel sounds are normal. There is no distension.     Palpations: Abdomen is soft. There is no mass.     Tenderness: There is no abdominal tenderness. There is no guarding or rebound.     Hernia: No hernia is present.  Musculoskeletal:        General: Normal range of motion.     Cervical back: Normal range of motion and neck supple.     Right lower leg: No edema.     Left lower leg: No edema.  Lymphadenopathy:     Cervical: No cervical adenopathy.  Skin:    General: Skin is warm and dry.     Findings: No rash.  Neurological:     General: No focal deficit present.     Mental Status: He is alert and oriented to person, place, and time.  Psychiatric:        Mood and Affect: Mood normal.        Behavior: Behavior normal.        Thought Content: Thought content normal.        Judgment: Judgment normal.       Results for orders placed or  performed in Martinez on 07/17/22  Sedimentation rate  Result Value Ref Range   Sed Rate 14 0 - 20 mm/hr  PSA  Result Value Ref Range   PSA 0.14 0.10 - 4.00 ng/mL  IBC panel  Result Value Ref Range   Iron 101 42 - 165 ug/dL   Transferrin 331.0 212.0 - 360.0 mg/dL   Saturation Ratios 21.8 20.0 - 50.0 %   TIBC 463.4 (H) 250.0 - 450.0 mcg/dL  Ferritin  Result Value Ref Range   Ferritin 30.8 22.0 - 322.0 ng/mL  Hemoglobin A1c  Result Value Ref Range   Hgb A1c MFr Bld 5.8 4.6 - 6.5 %  Comprehensive metabolic panel  Result Value Ref Range   Sodium 139 135 - 145 mEq/L   Potassium 5.0 3.5 - 5.1 mEq/L   Chloride 101 96 - 112 mEq/L   CO2 29 19 - 32 mEq/L   Glucose, Bld 98 70 - 99 mg/dL   BUN 16 6 - 23 mg/dL   Creatinine, Ser 0.99 0.40 - 1.50 mg/dL   Total Bilirubin 0.6 0.2 - 1.2 mg/dL   Alkaline Phosphatase 105 39 - 117 U/L   AST 18 0 - 37 U/L   ALT 21 0 - 53 U/L   Total Protein 7.0 6.0 - 8.3 g/dL   Albumin 4.2 3.5 - 5.2 g/dL   GFR 77.33 >60.00 mL/min   Calcium 9.3 8.4 - 10.5 mg/dL  Lipid panel  Result Value Ref Range   Cholesterol 154 0 - 200 mg/dL   Triglycerides 128.0 0.0 - 149.0 mg/dL   HDL 52.30 >39.00 mg/dL   VLDL 25.6 0.0 - 40.0 mg/dL   LDL Cholesterol 76 0 - 99 mg/dL   Total CHOL/HDL Ratio 3  NonHDL 102.07    Lab Results  Component Value Date   WBC 6.1 06/06/2022   HGB 16.6 06/06/2022   HCT 48.9 06/06/2022   MCV 92.5 06/06/2022   PLT 281.0 06/06/2022    Assessment & Plan:   Problem List Items Addressed This Martinez     Healthcare maintenance - Primary (Chronic)    Preventative protocols reviewed and updated unless pt declined. Discussed healthy diet and lifestyle.       Advanced directives, counseling/discussion (Chronic)    Advanced directive discussion - has this at home. Wife would be HCPOA. Asked to bring [place] a copy. ok with CPR and compression and intubation but no prolonged life support.       GERD (gastroesophageal reflux disease)    Chronic,  overall stable period on nexium '40mg'$  daily, recent addition of pepcid '20mg'$  BID with benefit.       Relevant Medications   esomeprazole (NEXIUM) 40 MG capsule   HLD (hyperlipidemia)    Chronic, stable on cholestyramine. The 10-year ASCVD risk score (Arnett DK, et al., 2019) is: 16.4%   Values used to calculate the score:     Age: 40 years     Sex: Male     Is Non-Hispanic African American: No     Diabetic: No     Tobacco smoker: No     Systolic Blood Pressure: 409 mmHg     Is BP treated: No     HDL Cholesterol: 52.3 mg/dL     Total Cholesterol: 154 mg/dL       Relevant Medications   cholestyramine (QUESTRAN) 4 g packet   Prediabetes    This has improved from last year. Encouraged limiting added sugar in diet.       Elevated alkaline phosphatase level    This has normalized after liver abscess treatment.      Iron deficiency    Mild with low normal iron stores - will continue working on iron rich diet.       Liver abscess    S/p treatment appreciate gen surgery care. Recent labs reassuring.         Meds ordered this encounter  Medications   esomeprazole (NEXIUM) 40 MG capsule    Sig: Take 1 capsule (40 mg total) by mouth daily at 12 noon.    Dispense:  90 capsule    Refill:  4   cholestyramine (QUESTRAN) 4 g packet    Sig: Take 1 packet (4 g total) by mouth 2 (two) times daily.    Dispense:  180 each    Refill:  4    No orders of the defined types were placed in this encounter.   Patient Instructions  Bring [place] copy of your advanced directive.  For gassiness- try Gas-X (simethicone). Avoid gas producing foods (beans, onions, celery, carrots, raisins, bananas, apricots, prunes, brussel sprouts, wheat germ, pretzels). Let us now if not improving with this + pepcid and regular nexium.  Good to see you today  Return as needed or in 1 year for next wellness Martinez/physical.   Follow up plan: Return in about 1 year (around 07/24/2023) for annual exam, prior fasting  for blood work, medicare wellness Martinez.  Ria Bush, MD

## 2022-07-23 NOTE — Assessment & Plan Note (Signed)
This has normalized after liver abscess treatment.

## 2022-07-23 NOTE — Assessment & Plan Note (Signed)
This has improved from last year. Encouraged limiting added sugar in diet.

## 2022-07-23 NOTE — Assessment & Plan Note (Signed)
Mild with low normal iron stores - will continue working on iron rich diet.

## 2022-07-30 ENCOUNTER — Other Ambulatory Visit: Payer: Self-pay | Admitting: Family Medicine

## 2022-07-30 NOTE — Telephone Encounter (Signed)
Refill request sildenafil Last office visit 07/23/22 Last refill 06/13/22 #[id number]

## 2022-08-16 ENCOUNTER — Telehealth: Payer: Self-pay

## 2022-08-16 ENCOUNTER — Other Ambulatory Visit (HOSPITAL_COMMUNITY): Payer: Self-pay

## 2022-08-16 ENCOUNTER — Ambulatory Visit: Payer: Medicare HMO | Admitting: Dermatology

## 2022-08-16 ENCOUNTER — Telehealth (INDEPENDENT_AMBULATORY_CARE_PROVIDER_SITE_OTHER): Payer: Medicare HMO | Admitting: Family Medicine

## 2022-08-16 ENCOUNTER — Encounter: Payer: Self-pay | Admitting: Family Medicine

## 2022-08-16 VITALS — Temp 99.0°F | Ht 71.5 in

## 2022-08-16 DIAGNOSIS — U071 COVID-19: Secondary | ICD-10-CM

## 2022-08-16 MED ORDER — PREDNISONE 20 MG PO TABS: ORAL_TABLET | ORAL | 0 refills | Status: DC

## 2022-08-16 MED ORDER — NIRMATRELVIR/RITONAVIR (PAXLOVID)TABLET
3.0000 | ORAL_TABLET | Freq: Two times a day (BID) | ORAL | 0 refills | Status: AC
Start: 2022-08-16 — End: 2022-08-21

## 2022-08-16 MED ORDER — PROMETHAZINE-DM 6.25-15 MG/5ML PO SYRP: 5.0000 mL | ORAL_SOLUTION | Freq: Every evening | ORAL | 0 refills | Status: DC | PRN

## 2022-08-16 NOTE — Progress Notes (Signed)
VIRTUAL VISIT A virtual visit is felt to be most appropriate for this patient at this time.   I connected with the patient on 08/16/22 at 12:00 PM EST by virtual telehealth platform and verified that I am speaking with the correct person using two identifiers.   I discussed the limitations, risks, security and privacy concerns of performing an evaluation and management service by  virtual telehealth platform and the availability of in person appointments. I also discussed with the patient that there may be a patient responsible charge related to this service. The patient expressed understanding and agreed to proceed.  Patient location: Home Provider Location: [facility] Participants: Eliezer Lofts and [name]   Chief Complaint  Patient presents with   Covid Positive    Positive home test yesterday and today Symptoms started Monday afternoon   Headache   Nasal Congestion   Cough    History of Present Illness:  72 y.o. male patient of Mark Bush, MD presents with COVID 19.   S/P COVID vaccine x 3  Date of onset: day 4 Initial symptoms included  runny nose, headache, chest congestion, cough Symptoms progressed to low grade temperature.  Some SOB and wheeze.  Trouble sleep ing at night due to cough.    Sick contacts:  COVID testing:   positive yesterday    He has tried to treat with  OTC mucinex DM     No history of chronic lung disease such as asthma or COPD. Non-smoker.    COVID 19 screen No recent travel or known exposure to COVID19 The patient denies respiratory symptoms of COVID 19 at this time.  The importance of social distancing was discussed today.   ROS    Past Medical History:  Diagnosis Date   Cataract    COVID-19 virus infection 06/2019   ED (erectile dysfunction)    Elevated LFTs    Esophageal stricture    GERD (gastroesophageal reflux disease)    with hiatal hernia   H/O hiatal hernia    History of prostatitis    HLD  (hyperlipidemia)    Prediabetes 07/28/2015   Shortness of breath    exersion   Tubular adenoma of colon     reports that he has never smoked. He has never been exposed to tobacco smoke. He quit smokeless tobacco use about 13 years ago. He reports that he does not drink alcohol and does not use drugs.   Current Outpatient Medications:    acetaminophen (TYLENOL) 325 MG tablet, Take 650 mg by mouth every 6 (six) hours as needed for moderate pain. , Disp: , Rfl:    cetirizine (ZYRTEC) 10 MG tablet, Take 1 tablet (10 mg total) by mouth daily., Disp: 30 tablet, Rfl: 0   cholestyramine (QUESTRAN) 4 g packet, Take 1 packet (4 g total) by mouth 2 (two) times daily., Disp: 180 each, Rfl: 4   clobetasol ointment (TEMOVATE) [id number] %, Apply 1 Application topically as directed. bid aa itchy rash on body up to 3 weeks, then weekends only, prn flares, avoid face, groin, axilla, Disp: 120 g, Rfl: 1   esomeprazole (NEXIUM) 40 MG capsule, Take 1 capsule (40 mg total) by mouth daily at 12 noon., Disp: 90 capsule, Rfl: 4   famotidine (PEPCID) 20 MG tablet, Take 20 mg by mouth in the morning and at bedtime., Disp: , Rfl:    Misc Natural Products (OSTEO BI-FLEX JOINT SHIELD PO), Take by mouth daily., Disp: , Rfl:    nirmatrelvir/ritonavir (  PAXLOVID) 20 x 150 MG & 10 x '100MG'$  TABS, Take 3 tablets by mouth 2 (two) times daily for 5 days. Patient GFR is 77. Take nirmatrelvir (150 mg) 3 tablet(s) twice daily for 5 days and ritonavir (100 mg) one tablet twice daily for 5 days., Disp: 30 tablet, Rfl: 0   POTASSIUM PO, Take by mouth daily., Disp: , Rfl:    predniSONE (DELTASONE) 20 MG tablet, 3 tabs by mouth daily x 3 days, then 2 tabs by mouth daily x 2 days then 1 tab by mouth daily x 2 days, Disp: 15 tablet, Rfl: 0   promethazine-dextromethorphan (PROMETHAZINE-DM) 6.25-15 MG/5ML syrup, Take 5 mLs by mouth at bedtime as needed for cough., Disp: 118 mL, Rfl: 0   sildenafil (REVATIO) 20 MG tablet, TAKE 3-5 TABLETS BY MOUTH EVERY  DAY AS NEEDED, Disp: 60 tablet, Rfl: 0   Observations/Objective: Temperature 99 F (37.2 C), temperature source Oral, height 5' 11.5" (1.816 m).  Physical Exam Constitutional:      General: The patient is not in acute distress. Pulmonary:     Effort: Pulmonary effort is normal. No respiratory distress.  Neurological:     Mental Status: The patient is alert and oriented to person, place, and time.  Psychiatric:        Mood and Affect: Mood normal.        Behavior: Behavior normal.    Assessment and Plan COVID-19 virus infection Assessment & Plan: COVID19  Infection < 5 days from onset of symptoms in 3 x vaccinated overweight individual with history of .age 76  No clear sign of bacterial infection at this time.   No SOB.  No red flags/need for ER visit or in-person exam at respiratory clinic at this time..    Pt higher risk for COVID complications given  age. GFR >60 and no medication contraindications.  Start paxlovid 5 day course. Reviewed course of medication and side effect profile with patient in detail.   Symptomatic care with mucinex and cough suppressant at night. If SOB begins symptoms worsening.. have low threshold for in-person exam, if severe shortness of breath ER visit recommended.  Can monitor Oxygen saturation at home with home monitor if able to obtain.  Go to ER if O2 sat < 90% on room air.   Reviewed home care and provided information through [place].  Recommended quarantine 5 days isolation recommended. Return to work day 6 and wear mask for 4 more days to complete 10 days. Provided info about prevention of spread of COVID 19.    Other orders -     predniSONE; 3 tabs by mouth daily x 3 days, then 2 tabs by mouth daily x 2 days then 1 tab by mouth daily x 2 days  Dispense: 15 tablet; Refill: 0 -     Promethazine-DM; Take 5 mLs by mouth at bedtime as needed for cough.  Dispense: 118 mL; Refill: 0 -     nirmatrelvir/ritonavir; Take 3 tablets by mouth 2  (two) times daily for 5 days. Patient GFR is 77. Take nirmatrelvir (150 mg) 3 tablet(s) twice daily for 5 days and ritonavir (100 mg) one tablet twice daily for 5 days.  Dispense: 30 tablet; Refill: 0      I discussed the assessment and treatment plan with the patient. The patient was provided an opportunity to ask questions and all were answered. The patient agreed with the plan and demonstrated an understanding of the instructions.   The patient was advised to call  back or seek an in-person evaluation if the symptoms worsen or if the condition fails to improve as anticipated.     Eliezer Lofts, MD

## 2022-08-16 NOTE — Telephone Encounter (Signed)
Patient Advocate Encounter   Received notification from [facility] Part D that prior authorization for Sildenafil is required.   PA submitted on 08/16/2022 Key [id number] Status is pending

## 2022-08-16 NOTE — Assessment & Plan Note (Signed)
COVID19  Infection < 5 days from onset of symptoms in 3 x vaccinated overweight individual with history of .age 72  No clear sign of bacterial infection at this time.   No SOB.  No red flags/need for ER visit or in-person exam at respiratory clinic at this time..    Pt higher risk for COVID complications given  age. GFR >60 and no medication contraindications.  Start paxlovid 5 day course. Reviewed course of medication and side effect profile with patient in detail.   Symptomatic care with mucinex and cough suppressant at night. If SOB begins symptoms worsening.. have low threshold for in-person exam, if severe shortness of breath ER visit recommended.  Can monitor Oxygen saturation at home with home monitor if able to obtain.  Go to ER if O2 sat < 90% on room air.   Reviewed home care and provided information through [place].  Recommended quarantine 5 days isolation recommended. Return to work day 6 and wear mask for 4 more days to complete 10 days. Provided info about prevention of spread of COVID 19.

## 2022-08-17 NOTE — Telephone Encounter (Signed)
Spoke with pt notifying him of denial. Pt verbalizes understanding and states he's been paying out-of-pocket for awhile now.

## 2022-08-17 NOTE — Telephone Encounter (Signed)
Pharmacy Patient Advocate Encounter  Received notification from Medicare Part D that the request for prior authorization for Sildenafil has been denied due to .    Please be advised we currently do not have a Pharmacist to review denials, therefore you will need to process appeals accordingly as needed. Thanks for your support at this time.

## 2022-08-27 ENCOUNTER — Encounter: Payer: Self-pay | Admitting: Family Medicine

## 2022-08-27 ENCOUNTER — Telehealth (INDEPENDENT_AMBULATORY_CARE_PROVIDER_SITE_OTHER): Payer: Medicare HMO | Admitting: Family Medicine

## 2022-08-27 VITALS — Ht 71.5 in | Wt 212.0 lb

## 2022-08-27 DIAGNOSIS — B9689 Other specified bacterial agents as the cause of diseases classified elsewhere: Secondary | ICD-10-CM

## 2022-08-27 DIAGNOSIS — J988 Other specified respiratory disorders: Secondary | ICD-10-CM

## 2022-08-27 MED ORDER — DEXAMETHASONE 6 MG PO TABS: 6.0000 mg | ORAL_TABLET | Freq: Every day | ORAL | 0 refills | Status: AC

## 2022-08-27 MED ORDER — AMOXICILLIN-POT CLAVULANATE 875-125 MG PO TABS: 1.0000 | ORAL_TABLET | Freq: Two times a day (BID) | ORAL | 0 refills | Status: AC

## 2022-08-27 NOTE — Progress Notes (Signed)
Virtual Visit via Video Note  I connected with [name] on 08/27/22 at  1:40 PM EDT by a video enabled telemedicine application and verified that I am speaking with the correct person using two identifiers.  Patient Location: Home Provider Location: Office/Clinic  I discussed the limitations, risks, security, and privacy concerns of performing an evaluation and management service by video and the availability of in person appointments. I also discussed with the patient that there may be a patient responsible charge related to this service. The patient expressed understanding and agreed to proceed.  Subjective: PCP: Ria Bush, MD  Chief Complaint  Patient presents with   URI    Head congestion, cough, nasal drainage - green, No fever, slight ST, Slight HA, eye pressure - Covid 2 weeks ago, was getting better, now getting worse again     Patient complains of cough, head congestion, headache, sore throat, facial pain/pressure, and postnasal drainage. He denies fever.  He reports he had COVID-19 two weeks ago and was getting better, but now he is getting worse again. He is drinking plenty of fluids. Evaluation to date: none. Treatment to date:  Mucinex-DM .  He does not smoke.    ROS: Per HPI  Current Outpatient Medications:    acetaminophen (TYLENOL) 325 MG tablet, Take 650 mg by mouth every 6 (six) hours as needed for moderate pain. , Disp: , Rfl:    cetirizine (ZYRTEC) 10 MG tablet, Take 1 tablet (10 mg total) by mouth daily., Disp: 30 tablet, Rfl: 0   cholestyramine (QUESTRAN) 4 g packet, Take 1 packet (4 g total) by mouth 2 (two) times daily., Disp: 180 each, Rfl: 4   clobetasol ointment (TEMOVATE) [id number] %, Apply 1 Application topically as directed. bid aa itchy rash on body up to 3 weeks, then weekends only, prn flares, avoid face, groin, axilla, Disp: 120 g, Rfl: 1   esomeprazole (NEXIUM) 40 MG capsule, Take 1 capsule (40 mg total) by mouth daily at 12 noon., Disp: 90  capsule, Rfl: 4   famotidine (PEPCID) 20 MG tablet, Take 20 mg by mouth in the morning and at bedtime., Disp: , Rfl:    Misc Natural Products (OSTEO BI-FLEX JOINT SHIELD PO), Take by mouth daily., Disp: , Rfl:    promethazine-dextromethorphan (PROMETHAZINE-DM) 6.25-15 MG/5ML syrup, Take 5 mLs by mouth at bedtime as needed for cough., Disp: 118 mL, Rfl: 0   sildenafil (REVATIO) 20 MG tablet, TAKE 3-5 TABLETS BY MOUTH EVERY DAY AS NEEDED, Disp: 60 tablet, Rfl: 0   POTASSIUM PO, Take by mouth daily. (Patient not taking: Reported on 08/27/2022), Disp: , Rfl:   Observations/Objective: Today's Vitals   08/27/22 1320  Weight: 212 lb (96.2 kg)  Height: 5' 11.5" (1.816 m)   Physical Exam Constitutional:      General: He is not in acute distress.    Appearance: Normal appearance. He is not ill-appearing or toxic-appearing.  Eyes:     General: No scleral icterus.       Right eye: No discharge.        Left eye: No discharge.     Conjunctiva/sclera: Conjunctivae normal.  Pulmonary:     Effort: Pulmonary effort is normal. No respiratory distress.  Neurological:     Mental Status: He is alert and oriented to person, place, and time.  Psychiatric:        Mood and Affect: Mood normal.        Behavior: Behavior normal.  Thought Content: Thought content normal.        Judgment: Judgment normal.     Assessment and Plan: 1. Bacterial respiratory infection Education provided on respiratory infections.  With his symptoms improving and then all of a sudden worsening, I feel this is bacterial.  Discussed symptom management including throat lozenges, chloraseptic spray, warm salt water gargles, hot tea/honey, cough syrup (Delsym), Mucinex, Tylenol, Vicks, and a humidifier at night.  - amoxicillin-clavulanate (AUGMENTIN) 875-125 MG tablet; Take 1 tablet by mouth 2 (two) times daily for 7 days.  Dispense: 14 tablet; Refill: 0 - dexamethasone (DECADRON) 6 MG tablet; Take 1 tablet (6 mg total) by mouth  daily for 5 days.  Dispense: 5 tablet; Refill: 0   Follow Up Instructions: Return if symptoms worsen or fail to improve.   I discussed the assessment and treatment plan with the patient. The patient was provided an opportunity to ask questions, and all were answered. The patient agreed with the plan and demonstrated an understanding of the instructions.   The patient was advised to call back or seek an in-person evaluation if the symptoms worsen or if the condition fails to improve as anticipated.  The above assessment and management plan was discussed with the patient. The patient verbalized understanding of and has agreed to the management plan.   [name], FNP

## 2022-08-27 NOTE — Patient Instructions (Signed)
Throat lozenges, chloraseptic spray, warm salt water gargles, hot tea/honey, cough syrup (Delsym), Mucinex, Tylenol, Vicks, and a humidifier at night.

## 2022-08-29 ENCOUNTER — Ambulatory Visit: Payer: Medicare HMO | Admitting: Dermatology

## 2022-08-30 NOTE — Progress Notes (Signed)
08/31/2022 Mark Martinez:[id number] 15-Nov-1950  Referring provider: Ria Bush, MD Primary GI doctor: Dr. Havery Moros (Dr. [name])  ASSESSMENT AND PLAN:   History of hiatal hernia with GERD, esophageal stricture presents with epigastric pressure/occ dysphagia 2022 EGD for abnormal CT scan showed gastritis, negative dysplasia, normal esophagus Since Nov worse epigastric pressure, non exertional.  Has seen PCP with negative cardiovascular work up.  Pepcid added to nexium has helped some, no new meds EGD with dilatation to evaluate for stenosis, tumor, erosive/infectious esophagititis, gastritis, worsening hiatal hernia  If the EGD is negative and symptoms continue after PPI trial, can consider barium swallow Will increase PPI to twice daily, emphasizing before food. I discussed risks of EGD with patient today, including risk of sedation, bleeding or perforation.  Patient provides understanding and gave verbal consent to proceed.  History of adenomatous polyp of colon Recall 2028   Patient Care Team: Ria Bush, MD as PCP - General  HISTORY OF PRESENT ILLNESS: 72 y.o. male with a past medical history of status post cholecystectomy 04/2020, elevated LFTs GERD with hiatal hernia and history of esophageal stricture, personal history of tubular adenomatous polyps and others listed below presents for evaluation of epigastric discomfort and dysphagia.   07/29/2019 colonoscopy Dr. [name] for heme positive stool 2 mm polyp descending colon external and internal hemorrhoids.  Tubular adenomatous polyp recall 7 years 10/18/2020 EGD with Dr. [name] for abnormal CT of GI tract thick distal esophagus mild inflammation characterized by erythema friability and granularity gastric antrum, small hiatal hernia.  Pathology was negative for dysplasia negative for H. pylori.  Mild reactive changes.  He will have "hernia attacks" will have epigastric pressure that would radiate out. Take  rolaids and drink water, he would have 3-4 attacks a year.  Since Nov of this year, he would have 3-4 a week.  Describes one episode in [place], lasted all day, rolaids would not help.  He can tell when it is coming on, started out slowly as a pressure an then becomes a pain, no radiation to back. Nonexertional. No nausea, vomiting, sweating, SOB with it.  Had evaluate with PCP with unremarkable EKG.    He states he has episodes in the morning before breakfast, and then he started to eat and felt food getting hung, happened once with dinner.  He has not had any other issues with dysphagia.  He states he has been on pepcid since DC and helped some. He is on nexium 40 mg once a day AM before food.  He had covid 3 weeks ago, while using mucinex liquid had issues.  Denies ETOH, smoking history, has quit chewing 13 years ago.   Labs 01/30 showed normal liver function, normal iron,  He  reports that he has never smoked. He has never been exposed to tobacco smoke. He quit smokeless tobacco use about 13 years ago. He reports that he does not drink alcohol and does not use drugs.  RELEVANT LABS AND IMAGING: CBC    Component Value Date/Time   WBC 6.1 06/06/2022 1536   RBC 5.28 06/06/2022 1536   HGB 16.6 06/06/2022 1536   HCT 48.9 06/06/2022 1536   PLT 281.0 06/06/2022 1536   MCV 92.5 06/06/2022 1536   MCH 29.4 12/04/2021 0957   MCHC 34.0 06/06/2022 1536   RDW 13.4 06/06/2022 1536   LYMPHSABS 1.9 06/06/2022 1536   MONOABS 0.4 06/06/2022 1536   EOSABS 0.2 06/06/2022 1536   BASOSABS 0.1 06/06/2022 1536  Recent Labs    09/04/21 0950 12/04/21 0957 06/06/22 1536  HGB 15.9 16.3 16.6     CMP     Component Value Date/Time   NA 139 07/17/2022 0743   K 5.0 07/17/2022 0743   CL 101 07/17/2022 0743   CO2 29 07/17/2022 0743   GLUCOSE 98 07/17/2022 0743   BUN 16 07/17/2022 0743   CREATININE 0.99 07/17/2022 0743   CREATININE 1.02 05/05/2021 1529   CALCIUM 9.3 07/17/2022 0743   PROT  7.0 07/17/2022 0743   ALBUMIN 4.2 07/17/2022 0743   AST 18 07/17/2022 0743   ALT 21 07/17/2022 0743   ALKPHOS 105 07/17/2022 0743   BILITOT 0.6 07/17/2022 0743   GFRNONAA >60 12/04/2021 0957   GFRAA >90 08/26/2013 0826      Latest Ref Rng & Units 07/17/2022    7:43 AM 06/06/2022    3:36 PM 12/04/2021    9:57 AM  Hepatic Function  Total Protein 6.0 - 8.3 g/dL 7.0  6.8  7.2   Albumin 3.5 - 5.2 g/dL 4.2  4.1  3.6   AST 0 - 37 U/L 18  22  29    ALT 0 - 53 U/L 21  23  28    Alk Phosphatase 39 - 117 U/L 105  100  124   Total Bilirubin 0.2 - 1.2 mg/dL 0.6  0.5  0.8   Bilirubin, Direct 0.0 - 0.3 mg/dL  0.1        Current Medications:   Current Outpatient Medications (Endocrine & Metabolic):    dexamethasone (DECADRON) 6 MG tablet, Take 1 tablet (6 mg total) by mouth daily for 5 days.  Current Outpatient Medications (Cardiovascular):    cholestyramine (QUESTRAN) 4 g packet, Take 1 packet (4 g total) by mouth 2 (two) times daily.   sildenafil (REVATIO) 20 MG tablet, TAKE 3-5 TABLETS BY MOUTH EVERY DAY AS NEEDED  Current Outpatient Medications (Respiratory):    cetirizine (ZYRTEC) 10 MG tablet, Take 1 tablet (10 mg total) by mouth daily.   promethazine-dextromethorphan (PROMETHAZINE-DM) 6.25-15 MG/5ML syrup, Take 5 mLs by mouth at bedtime as needed for cough.  Current Outpatient Medications (Analgesics):    acetaminophen (TYLENOL) 325 MG tablet, Take 650 mg by mouth every 6 (six) hours as needed for moderate pain.    Current Outpatient Medications (Other):    amoxicillin-clavulanate (AUGMENTIN) 875-125 MG tablet, Take 1 tablet by mouth 2 (two) times daily for 7 days.   clobetasol ointment (TEMOVATE) AB-[id number] %, Apply 1 Application topically as directed. bid aa itchy rash on body up to 3 weeks, then weekends only, prn flares, avoid face, groin, axilla   famotidine (PEPCID) 20 MG tablet, Take 20 mg by mouth in the morning and at bedtime.   Misc Natural Products (OSTEO BI-FLEX JOINT SHIELD  PO), Take by mouth daily.   POTASSIUM PO, Take by mouth daily.   esomeprazole (NEXIUM) 40 MG capsule, Take 1 capsule (40 mg total) by mouth 2 (two) times daily before a meal.  Medical History:  Past Medical History:  Diagnosis Date   Cataract    COVID-19 virus infection 06/2019   ED (erectile dysfunction)    Elevated LFTs    Esophageal stricture    GERD (gastroesophageal reflux disease)    with hiatal hernia   H/O hiatal hernia    History of prostatitis    HLD (hyperlipidemia)    Prediabetes 07/28/2015   Shortness of breath    exersion   Tubular adenoma of colon  Allergies:  Allergies  Allergen Reactions   Codeine Other (See Comments)    Keeps him awake     Surgical History:  He  has a past surgical history that includes Back surgery (1994); Esophagogastroduodenoscopy (12-27-1996); Esophageal dilation; Colonoscopy (2005); Cyst removal neck (Left, 08/31/2013); Colonoscopy (07/2019); Shoulder arthroscopy with rotator cuff repair and subacromial decompression (Right, 01/2020); and Cholecystectomy. Family History:  His family history includes CAD in his paternal uncle; CAD (age of onset: 58) in his father; Ulcers in his father; Valvular heart disease in his mother.  REVIEW OF SYSTEMS  : All other systems reviewed and negative except where noted in the History of Present Illness.  PHYSICAL EXAM: BP 132/80 (BP Location: Left Arm, Patient Position: Sitting, Cuff Size: Normal)   Pulse 61   Ht 5' 11.5" (1.816 m)   Wt 210 lb 4 oz (95.4 kg)   BMI 28.92 kg/m  General Appearance: Well nourished, in no apparent distress. Head:   Normocephalic and atraumatic. Eyes:  sclerae anicteric,conjunctive pink  Respiratory: Respiratory effort normal, BS equal bilaterally without rales, rhonchi, wheezing. Cardio: RRR with no MRGs. Peripheral pulses intact.  Abdomen: Soft,  Obese ,active bowel sounds. mild tenderness in the epigastrium. Without guarding and Without rebound. No masses. Rectal: Not  evaluated Musculoskeletal: Full ROM, Normal gait. Without edema. Skin:  Dry and intact without significant lesions or rashes Neuro: Alert and  oriented x4;  No focal deficits. Psych:  Cooperative. Normal mood and affect.    Vladimir Crofts, PA-C 10:56 AM

## 2022-08-31 ENCOUNTER — Ambulatory Visit: Payer: Medicare HMO | Admitting: Physician Assistant

## 2022-08-31 ENCOUNTER — Encounter: Payer: Self-pay | Admitting: Physician Assistant

## 2022-08-31 VITALS — BP 132/80 | HR 61 | Ht 71.5 in | Wt 210.2 lb

## 2022-08-31 DIAGNOSIS — R1319 Other dysphagia: Secondary | ICD-10-CM | POA: Diagnosis not present

## 2022-08-31 DIAGNOSIS — K2970 Gastritis, unspecified, without bleeding: Secondary | ICD-10-CM | POA: Diagnosis not present

## 2022-08-31 DIAGNOSIS — K2990 Gastroduodenitis, unspecified, without bleeding: Secondary | ICD-10-CM | POA: Diagnosis not present

## 2022-08-31 DIAGNOSIS — Z86010 Personal history of colonic polyps: Secondary | ICD-10-CM | POA: Diagnosis not present

## 2022-08-31 DIAGNOSIS — K219 Gastro-esophageal reflux disease without esophagitis: Secondary | ICD-10-CM

## 2022-08-31 MED ORDER — ESOMEPRAZOLE MAGNESIUM 40 MG PO CPDR: 40.0000 mg | DELAYED_RELEASE_CAPSULE | Freq: Two times a day (BID) | ORAL | 0 refills | Status: DC

## 2022-08-31 NOTE — Patient Instructions (Addendum)
Please take your proton pump inhibitor medication, increase nexium 40 mg BID  Please take this medication 30 minutes to 1 hour before meals- this makes it more effective.  Avoid spicy and acidic foods Avoid fatty foods Limit your intake of coffee, tea, alcohol, and carbonated drinks Work to maintain a healthy weight Keep the head of the bed elevated at least 3 inches with blocks or a wedge pillow if you are having any nighttime symptoms Stay upright for 2 hours after eating Avoid meals and snacks three to four hours before bedtime  You have been scheduled for an endoscopy. Please follow written instructions given to you at your visit today. If you use inhalers (even only as needed), please bring them with you on the day of your procedure.   I appreciate the opportunity to care for you. Vicie Mutters, PA-C

## 2022-09-01 NOTE — Progress Notes (Signed)
Agree with assessment and plan as outlined.  

## 2022-09-03 DIAGNOSIS — M5136 Other intervertebral disc degeneration, lumbar region: Secondary | ICD-10-CM | POA: Diagnosis not present

## 2022-09-03 DIAGNOSIS — M9903 Segmental and somatic dysfunction of lumbar region: Secondary | ICD-10-CM | POA: Diagnosis not present

## 2022-09-03 DIAGNOSIS — M9905 Segmental and somatic dysfunction of pelvic region: Secondary | ICD-10-CM | POA: Diagnosis not present

## 2022-09-03 DIAGNOSIS — M5416 Radiculopathy, lumbar region: Secondary | ICD-10-CM | POA: Diagnosis not present

## 2022-09-04 ENCOUNTER — Ambulatory Visit: Payer: Medicare HMO | Admitting: Dermatology

## 2022-09-04 ENCOUNTER — Encounter: Payer: Self-pay | Admitting: Dermatology

## 2022-09-04 VITALS — BP 122/83 | HR 70

## 2022-09-04 DIAGNOSIS — L300 Nummular dermatitis: Secondary | ICD-10-CM | POA: Diagnosis not present

## 2022-09-04 DIAGNOSIS — L578 Other skin changes due to chronic exposure to nonionizing radiation: Secondary | ICD-10-CM | POA: Diagnosis not present

## 2022-09-04 DIAGNOSIS — Z872 Personal history of diseases of the skin and subcutaneous tissue: Secondary | ICD-10-CM | POA: Diagnosis not present

## 2022-09-04 NOTE — Progress Notes (Signed)
   Follow-Up Visit   Subjective  Mark Martinez is a 72 y.o. male who presents for the following: Eczema (Hx of eczema at arms, legs, chest, used clobetasol to aa for flares. Reports is staying clear) and Actinic Keratosis (Hx of aks at right sideburn and right forearm. Treated with L2n. Also use 5 f/u cream treatment to nose, forehead, and right temple. ).   The following portions of the chart were reviewed this encounter and updated as appropriate:  Tobacco  Allergies  Meds  Problems  Med Hx  Surg Hx  Fam Hx      Review of Systems: No other skin or systemic complaints except as noted in HPI or Assessment and Plan.   Objective  Well appearing patient in no apparent distress; mood and affect are within normal limits.  A focused examination was performed including face, arms. Relevant physical exam findings are noted in the Assessment and Plan.  arms, legs, chest Clear at exam   Assessment & Plan   Nummular eczema arms, legs, chest  Clear. Observe for recurrence.    Continue Dove for sensitive skin Can use if flared Clobetasol oint bid aa rash up to 3 weeks then weekends only, prn flares, avoid f/g/axilla   Topical steroids (such as triamcinolone, fluocinolone, fluocinonide, mometasone, clobetasol, halobetasol, betamethasone, hydrocortisone) can cause thinning and lightening of the skin if they are used for too long in the same area. Your physician has selected the right strength medicine for your problem and area affected on the body. Please use your medication only as directed by your physician to prevent side effects.     Atopic dermatitis (eczema) is a chronic, relapsing, pruritic condition that can significantly affect quality of life. It is often associated with allergic rhinitis and/or asthma and can require treatment with topical medications, phototherapy, or in severe cases biologic injectable medication (Dupixent; Adbry) or Oral JAK inhibitors.   Actinic Damage -  chronic, secondary to cumulative UV radiation exposure/sun exposure over time - diffuse scaly erythematous macules with underlying dyspigmentation - Recommend daily broad spectrum sunscreen SPF 30+ to sun-exposed areas, reapply every 2 hours as needed.  - Recommend staying in the shade or wearing long sleeves, sun glasses (UVA+UVB protection) and wide brim hats (4-inch brim around the entire circumference of the hat). - Call for new or changing lesions.  History of PreCancerous Actinic Keratosis  At Rt sideburn area, and right forearm, nose, forehead, right temple  - site(s) of PreCancerous Actinic Keratosis clear today. - these may recur and new lesions may form requiring treatment to prevent transformation into skin cancer - observe for new or changing spots and contact [name] for appointment if occur - photoprotection with sun protective clothing; sunglasses and broad spectrum sunscreen with SPF of at least 30 + and frequent self skin exams recommended - yearly exams by a dermatologist recommended for persons with history of PreCancerous Actinic Keratoses  Return for november tbse .  I, Ruthell Rummage, CMA, am acting as scribe for Forest Gleason, MD.  Documentation: I have reviewed the above documentation for accuracy and completeness, and I agree with the above.  Forest Gleason, MD

## 2022-09-04 NOTE — Patient Instructions (Addendum)
Recommend taking Heliocare sun protection supplement daily in sunny weather for additional sun protection. For maximum protection on the sunniest days, you can take up to 2 capsules of regular Heliocare OR take 1 capsule of Heliocare Ultra. For prolonged exposure (such as a full day in the sun), you can repeat your dose of the supplement 4 hours after your first dose. Heliocare can be purchased at [facility], at some [facility] or at www.heliocare.com.      Melanoma ABCDEs  Melanoma is the most dangerous type of skin cancer, and is the leading cause of death from skin disease.  You are more likely to develop melanoma if you: Have light-colored skin, light-colored eyes, or red or blond hair Spend a lot of time in the sun Tan regularly, either outdoors or in a tanning bed Have had blistering sunburns, especially during childhood Have a close family member who has had a melanoma Have atypical moles or large birthmarks  Early detection of melanoma is key since treatment is typically straightforward and cure rates are extremely high if we catch it early.   The first sign of melanoma is often a change in a mole or a new dark spot.  The ABCDE system is a way of remembering the signs of melanoma.  A for asymmetry:  The two halves do not match. B for border:  The edges of the growth are irregular. C for color:  A mixture of colors are present instead of an even brown color. D for diameter:  Melanomas are usually (but not always) greater than 6mm - the size of a pencil eraser. E for evolution:  The spot keeps changing in size, shape, and color.  Please check your skin once per month between visits. You can use a small mirror in front and a large mirror behind you to keep an eye on the back side or your body.   If you see any new or changing lesions before your next follow-up, please call to schedule a visit.  Please continue daily skin protection including broad spectrum sunscreen SPF 30+  to sun-exposed areas, reapplying every 2 hours as needed when you're outdoors.   Staying in the shade or wearing long sleeves, sun glasses (UVA+UVB protection) and wide brim hats (4-inch brim around the entire circumference of the hat) are also recommended for sun protection.    Due to recent changes in healthcare laws, you may see results of your pathology and/or laboratory studies on MyChart before the doctors have had a chance to review them. We understand that in some cases there may be results that are confusing or concerning to you. Please understand that not all results are received at the same time and often the doctors may need to interpret multiple results in order to provide you with the best plan of care or course of treatment. Therefore, we ask that you please give us 2 business days to thoroughly review all your results before contacting the office for clarification. Should we see a critical lab result, you will be contacted sooner.   If You Need Anything After Your Visit  If you have any questions or concerns for your doctor, please call our main line at [phone] and press option 4 to reach your doctor's medical assistant. If no one answers, please leave a voicemail as directed and we will return your call as soon as possible. Messages left after 4 pm will be answered the following business day.   You may also send us   a message via MyChart. We typically respond to MyChart messages within 1-2 business days.  For prescription refills, please ask your pharmacy to contact our office. Our fax number is [phone].  If you have an urgent issue when the clinic is closed that cannot wait until the next business day, you can page your doctor at the number below.    Please note that while we do our best to be available for urgent issues outside of office hours, we are not available 24/7.   If you have an urgent issue and are unable to reach us, you may choose to seek medical care at  your doctor's office, retail clinic, urgent care center, or emergency room.  If you have a medical emergency, please immediately call 911 or go to the emergency department.  Pager Numbers  - Dr. Kowalski: [phone]  - Dr. Moye: [phone]  - Dr. Stewart: [phone]  In the event of inclement weather, please call our main line at [phone] for an update on the status of any delays or closures.  Dermatology Medication Tips: Please keep the boxes that topical medications come in in order to help keep track of the instructions about where and how to use these. Pharmacies typically print the medication instructions only on the boxes and not directly on the medication tubes.   If your medication is too expensive, please contact our office at [phone] option 4 or send us a message through MyChart.   We are unable to tell what your co-pay for medications will be in advance as this is different depending on your insurance coverage. However, we may be able to find a substitute medication at lower cost or fill out paperwork to get insurance to cover a needed medication.   If a prior authorization is required to get your medication covered by your insurance company, please allow us 1-2 business days to complete this process.  Drug prices often vary depending on where the prescription is filled and some pharmacies may offer cheaper prices.  The website www.goodrx.com contains coupons for medications through different pharmacies. The prices here do not account for what the cost may be with help from insurance (it may be cheaper with your insurance), but the website can give you the price if you did not use any insurance.  - You can print the associated coupon and take it with your prescription to the pharmacy.  - You may also stop by our office during regular business hours and pick up a GoodRx coupon card.  - If you need your prescription sent electronically to a different pharmacy,  notify our office through [facility] MyChart or by phone at [phone] option 4.     Si Usted Necesita Algo Despus de Su Visita  Tambin puede enviarnos un mensaje a travs de MyChart. Por lo general respondemos a los mensajes de MyChart en el transcurso de 1 a 2 das hbiles.  Para renovar recetas, por favor pida a su farmacia que se ponga en contacto con nuestra oficina. Nuestro nmero de fax es el [phone].  Si tiene un asunto urgente cuando la clnica est cerrada y que no puede esperar hasta el siguiente da hbil, puede llamar/localizar a su doctor(a) al nmero que aparece a continuacin.   Por favor, tenga en cuenta que aunque hacemos todo lo posible para estar disponibles para asuntos urgentes fuera del horario de oficina, no estamos disponibles las 24 horas del da, los 7 das de la semana.   Si tiene un problema   urgente y no puede comunicarse con nosotros, puede optar por buscar atencin mdica  en el consultorio de su doctor(a), en una clnica privada, en un centro de atencin urgente o en una sala de emergencias.  Si tiene una emergencia mdica, por favor llame inmediatamente al 911 o vaya a la sala de emergencias.  Nmeros de bper  - Dr. Kowalski: [phone]  - Dra. Moye: [phone]  - Dra. Stewart: [phone]  En caso de inclemencias del tiempo, por favor llame a nuestra lnea principal al [phone] para una actualizacin sobre el estado de cualquier retraso o cierre.  Consejos para la medicacin en dermatologa: Por favor, guarde las cajas en las que vienen los medicamentos de uso tpico para ayudarle a seguir las instrucciones sobre dnde y cmo usarlos. Las farmacias generalmente imprimen las instrucciones del medicamento slo en las cajas y no directamente en los tubos del medicamento.   Si su medicamento es muy caro, por favor, pngase en contacto con nuestra oficina llamando al [phone] y presione la opcin 4 o envenos un mensaje a travs de  MyChart.   No podemos decirle cul ser su copago por los medicamentos por adelantado ya que esto es diferente dependiendo de la cobertura de su seguro. Sin embargo, es posible que podamos encontrar un medicamento sustituto a menor costo o llenar un formulario para que el seguro cubra el medicamento que se considera necesario.   Si se requiere una autorizacin previa para que su compaa de seguros cubra su medicamento, por favor permtanos de 1 a 2 das hbiles para completar este proceso.  Los precios de los medicamentos varan con frecuencia dependiendo del lugar de dnde se surte la receta y alguna farmacias pueden ofrecer precios ms baratos.  El sitio web www.goodrx.com tiene cupones para medicamentos de diferentes farmacias. Los precios aqu no tienen en cuenta lo que podra costar con la ayuda del seguro (puede ser ms barato con su seguro), pero el sitio web puede darle el precio si no utiliz ningn seguro.  - Puede imprimir el cupn correspondiente y llevarlo con su receta a la farmacia.  - Tambin puede pasar por nuestra oficina durante el horario de atencin regular y recoger una tarjeta de cupones de GoodRx.  - Si necesita que su receta se enve electrnicamente a una farmacia diferente, informe a nuestra oficina a travs de MyChart de [place] o por telfono llamando al [phone] y presione la opcin 4.  

## 2022-09-19 ENCOUNTER — Ambulatory Visit: Payer: Medicare HMO | Admitting: Internal Medicine

## 2022-09-20 ENCOUNTER — Encounter: Payer: Self-pay | Admitting: *Deleted

## 2022-09-20 ENCOUNTER — Ambulatory Visit (INDEPENDENT_AMBULATORY_CARE_PROVIDER_SITE_OTHER): Payer: Medicare HMO | Admitting: Internal Medicine

## 2022-09-20 ENCOUNTER — Encounter: Payer: Self-pay | Admitting: Internal Medicine

## 2022-09-20 VITALS — BP 106/78 | HR 67 | Temp 97.9°F | Ht 71.5 in | Wt 216.0 lb

## 2022-09-20 DIAGNOSIS — R053 Chronic cough: Secondary | ICD-10-CM

## 2022-09-20 DIAGNOSIS — K750 Abscess of liver: Secondary | ICD-10-CM | POA: Diagnosis not present

## 2022-09-20 DIAGNOSIS — U099 Post covid-19 condition, unspecified: Secondary | ICD-10-CM | POA: Diagnosis not present

## 2022-09-20 DIAGNOSIS — R1011 Right upper quadrant pain: Secondary | ICD-10-CM

## 2022-09-20 DIAGNOSIS — R82998 Other abnormal findings in urine: Secondary | ICD-10-CM

## 2022-09-20 DIAGNOSIS — R8290 Unspecified abnormal findings in urine: Secondary | ICD-10-CM | POA: Diagnosis not present

## 2022-09-20 LAB — POC URINALSYSI DIPSTICK (AUTOMATED)
Bilirubin, UA: NEGATIVE
Blood, UA: NEGATIVE
Glucose, UA: NEGATIVE
Ketones, UA: NEGATIVE
Leukocytes, UA: NEGATIVE
Nitrite, UA: NEGATIVE
Protein, UA: NEGATIVE
Spec Grav, UA: 1.02 (ref 1.010–1.025)
Urobilinogen, UA: 0.2 E.U./dL
pH, UA: 6 (ref 5.0–8.0)

## 2022-09-20 MED ORDER — AMOXICILLIN-POT CLAVULANATE 875-125 MG PO TABS: 1.0000 | ORAL_TABLET | Freq: Two times a day (BID) | ORAL | 0 refills | Status: DC

## 2022-09-20 NOTE — Assessment & Plan Note (Signed)
Same symptoms as before Not severe enough to make me think about recurrent abscess---but his gallbladder fossa has not been normal since the surgery Will go ahead with augmentin just in case Will recheck the CT scan though

## 2022-09-20 NOTE — Assessment & Plan Note (Signed)
Saw drops of blood Urinalysis negative now Will not take action unless it recurs (might need cystoscopy if recurs)

## 2022-09-20 NOTE — Assessment & Plan Note (Signed)
Will go ahead with 10 days of the augmentin just in case

## 2022-09-20 NOTE — Progress Notes (Signed)
Subjective:    Patient ID: Mark Martinez, male    DOB: 1951/03/06, 72 y.o.   MRN: [id number]  HPI Here due to abdominal pain and persistent cough  Cholecystectomy 3 years ago Had nagging pain for nearly a year CT showed apparent abscess in gallbladder fossa---was drained and treated Still has an aggravating pain in RUQ Occasional sharper pains as well  No fever Has had "clammy feeling" at times Also with respiratory symptoms since COVID 6 weeks ago Still coughs up stuff---and had DOE (while using wood splitter)  Several prostate infections---current symptoms are different Chronic weak stream Saw spots of blood on his underwear 3 weeks ago--no recurrence Urine seems more orange/yellow  Current Outpatient Medications on File Prior to Visit  Medication Sig Dispense Refill   acetaminophen (TYLENOL) 325 MG tablet Take 650 mg by mouth every 6 (six) hours as needed for moderate pain.      cetirizine (ZYRTEC) 10 MG tablet Take 1 tablet (10 mg total) by mouth daily. 30 tablet 0   cholestyramine (QUESTRAN) 4 g packet Take 1 packet (4 g total) by mouth 2 (two) times daily. 180 each 4   esomeprazole (NEXIUM) 40 MG capsule Take 1 capsule (40 mg total) by mouth 2 (two) times daily before a meal. 60 capsule 0   famotidine (PEPCID) 20 MG tablet Take 20 mg by mouth in the morning and at bedtime.     Misc Natural Products (OSTEO BI-FLEX JOINT SHIELD PO) Take by mouth daily.     POTASSIUM PO Take by mouth daily.     sildenafil (REVATIO) 20 MG tablet TAKE 3-5 TABLETS BY MOUTH EVERY DAY AS NEEDED 60 tablet 0   No current facility-administered medications on file prior to visit.    Allergies  Allergen Reactions   Codeine Other (See Comments)    Keeps him awake    Past Medical History:  Diagnosis Date   Cataract    COVID-19 virus infection 06/2019   ED (erectile dysfunction)    Elevated LFTs    Esophageal stricture    GERD (gastroesophageal reflux disease)    with hiatal hernia   H/O  hiatal hernia    History of prostatitis    HLD (hyperlipidemia)    Prediabetes 07/28/2015   Shortness of breath    exersion   Tubular adenoma of colon     Past Surgical History:  Procedure Laterality Date   BACK SURGERY  1994   lumbar area   CHOLECYSTECTOMY     COLONOSCOPY  2005   WNL, rpt 10 yrs   COLONOSCOPY  07/2019   1 TA, hemorrhoids, rpt 7 yrs [name])   CYST REMOVAL NECK Left 08/31/2013   Procedure: EXCISION EPIDERMAL INCLUSION CYST LEFT NECK;  Surgeon: Zenovia Jarred, MD   ESOPHAGEAL DILATION     three last   ESOPHAGOGASTRODUODENOSCOPY  12-27-1996   hiatal hernia (Dr. Sharlett Iles)   SHOULDER ARTHROSCOPY WITH ROTATOR CUFF REPAIR AND SUBACROMIAL DECOMPRESSION Right 01/2020   distal clavicle resection, RTC repair (Supple)    Family History  Problem Relation Age of Onset   Valvular heart disease Mother    Ulcers Father    CAD Father 61       multiple stents   CAD Paternal Uncle    Cancer Neg Hx    Diabetes Neg Hx    Stroke Neg Hx    Colon cancer Neg Hx    Esophageal cancer Neg Hx    Pancreatic cancer Neg Hx  Liver disease Neg Hx    Stomach cancer Neg Hx     Social History   Socioeconomic History   Marital status: Married    Spouse name: Not on file   Number of children: 3   Years of education: Not on file   Highest education level: Not on file  Occupational History   Occupation: seeding and landscaping services    Employer: OTHER  Tobacco Use   Smoking status: Never    Passive exposure: Never   Smokeless tobacco: Former    Quit date: 01/30/2009  Vaping Use   Vaping Use: Never used  Substance and Sexual Activity   Alcohol use: No   Drug use: No   Sexual activity: Not Currently  Other Topics Concern   Not on file  Social History Narrative   Caffeine: rare   Lives with wife, 1 dog.   Grown children   Occupation: Works in Biomedical scientist.   Activity: stays active at work   Diet: daily fruits/vegetables, some water   Social Determinants of Health    Financial Resource Strain: Low Risk  (06/21/2022)   Overall Financial Resource Strain (CARDIA)    Difficulty of Paying Living Expenses: Not hard at all  Food Insecurity: No Food Insecurity (06/21/2022)   Hunger Vital Sign    Worried About Running Out of Food in the Last Year: Never true    Ran Out of Food in the Last Year: Never true  Transportation Needs: No Transportation Needs (06/21/2022)   PRAPARE - Hydrologist (Medical): No    Lack of Transportation (Non-Medical): No  Physical Activity: Inactive (06/21/2022)   Exercise Vital Sign    Days of Exercise per Week: 0 days    Minutes of Exercise per Session: 0 min  Stress: No Stress Concern Present (06/21/2022)   [place]    Feeling of Stress : Not at all  Social Connections: [place] (06/21/2022)   Social Connection and Isolation Panel [NHANES]    Frequency of Communication with Friends and Family: More than three times a week    Frequency of Social Gatherings with Friends and Family: More than three times a week    Attends Religious Services: More than 4 times per year    Active Member of [employer] or Organizations: Yes    Attends Archivist Meetings: 1 to 4 times per year    Marital Status: Married  Human resources officer Violence: Not At Risk (06/21/2022)   Humiliation, Afraid, Rape, and Kick questionnaire    Fear of Current or Ex-Partner: No    Emotionally Abused: No    Physically Abused: No    Sexually Abused: No   Review of Systems No N/V Bowels are fine--normal color Appetite is fine---no weight loss     Objective:   Physical Exam Constitutional:      Appearance: Normal appearance.  Cardiovascular:     Rate and Rhythm: Normal rate and regular rhythm.     Heart sounds: No murmur heard.    No gallop.  Pulmonary:     Effort: Pulmonary effort is normal.     Breath sounds: Normal breath sounds. No wheezing or rales.   Abdominal:     General: Bowel sounds are normal.     Palpations: Abdomen is soft.     Tenderness: There is no guarding or rebound.     Comments: Minimally positive Murphy's sign  Musculoskeletal:     Cervical  back: Neck supple.     Right lower leg: No edema.     Left lower leg: No edema.  Lymphadenopathy:     Cervical: No cervical adenopathy.  Neurological:     Mental Status: He is alert.            Assessment & Plan:

## 2022-09-25 ENCOUNTER — Other Ambulatory Visit: Payer: Self-pay | Admitting: Family Medicine

## 2022-09-25 DIAGNOSIS — N529 Male erectile dysfunction, unspecified: Secondary | ICD-10-CM

## 2022-09-25 NOTE — Telephone Encounter (Signed)
Refill request Sildenafil Last refill 07/31/22 #60 Last office visit 09/20/22

## 2022-09-26 MED ORDER — SILDENAFIL CITRATE 20 MG PO TABS
ORAL_TABLET | ORAL | 11 refills | Status: DC
Start: 2022-09-26 — End: 2023-07-31

## 2022-09-26 NOTE — Telephone Encounter (Signed)
Patient states he usually gets a couple refills for the sildenafil (REVATIO) 20 MG tablet no refills requested for this order sent yesterday afternoon  Patient inquiring why?  Please follow up at [phone]

## 2022-09-26 NOTE — Telephone Encounter (Signed)
Rtn pt's call. States Dr. Reece Agar usually puts additional refills on sildenafil rx.   I see pt recently had CPE 07/23/22. I sent new rx with 11 additional refills and notified pt. Pt expresses his thanks.

## 2022-09-27 ENCOUNTER — Other Ambulatory Visit: Payer: Medicare HMO

## 2022-09-28 ENCOUNTER — Ambulatory Visit
Admission: RE | Admit: 2022-09-28 | Discharge: 2022-09-28 | Disposition: A | Discharge status: Home / Self Care | Payer: Medicare HMO | Source: Ambulatory Visit | Attending: Internal Medicine | Admitting: Internal Medicine

## 2022-09-28 DIAGNOSIS — N200 Calculus of kidney: Secondary | ICD-10-CM | POA: Diagnosis not present

## 2022-09-28 DIAGNOSIS — K750 Abscess of liver: Secondary | ICD-10-CM

## 2022-09-28 DIAGNOSIS — Z8719 Personal history of other diseases of the digestive system: Secondary | ICD-10-CM | POA: Diagnosis not present

## 2022-09-28 DIAGNOSIS — Z9049 Acquired absence of other specified parts of digestive tract: Secondary | ICD-10-CM | POA: Diagnosis not present

## 2022-09-28 DIAGNOSIS — R1011 Right upper quadrant pain: Secondary | ICD-10-CM

## 2022-09-28 DIAGNOSIS — M4317 Spondylolisthesis, lumbosacral region: Secondary | ICD-10-CM | POA: Diagnosis not present

## 2022-09-28 MED ORDER — IOPAMIDOL (ISOVUE-300) INJECTION 61%
100.0000 mL | Freq: Once | INTRAVENOUS | Status: AC | PRN
  Administered 2022-09-28: 100 mL via INTRAVENOUS

## 2022-10-01 DIAGNOSIS — M5136 Other intervertebral disc degeneration, lumbar region: Secondary | ICD-10-CM | POA: Diagnosis not present

## 2022-10-01 DIAGNOSIS — M9905 Segmental and somatic dysfunction of pelvic region: Secondary | ICD-10-CM | POA: Diagnosis not present

## 2022-10-01 DIAGNOSIS — M5416 Radiculopathy, lumbar region: Secondary | ICD-10-CM | POA: Diagnosis not present

## 2022-10-01 DIAGNOSIS — M9903 Segmental and somatic dysfunction of lumbar region: Secondary | ICD-10-CM | POA: Diagnosis not present

## 2022-10-03 ENCOUNTER — Encounter: Payer: Self-pay | Admitting: Gastroenterology

## 2022-10-03 ENCOUNTER — Ambulatory Visit (AMBULATORY_SURGERY_CENTER): Payer: Medicare HMO | Admitting: Gastroenterology

## 2022-10-03 VITALS — BP 118/75 | HR 60 | Temp 98.7°F | Resp 11 | Ht 71.0 in | Wt 210.0 lb

## 2022-10-03 DIAGNOSIS — R0789 Other chest pain: Secondary | ICD-10-CM | POA: Diagnosis not present

## 2022-10-03 DIAGNOSIS — K219 Gastro-esophageal reflux disease without esophagitis: Secondary | ICD-10-CM | POA: Diagnosis not present

## 2022-10-03 DIAGNOSIS — K224 Dyskinesia of esophagus: Secondary | ICD-10-CM | POA: Diagnosis not present

## 2022-10-03 DIAGNOSIS — K449 Diaphragmatic hernia without obstruction or gangrene: Secondary | ICD-10-CM

## 2022-10-03 DIAGNOSIS — K319 Disease of stomach and duodenum, unspecified: Secondary | ICD-10-CM | POA: Diagnosis not present

## 2022-10-03 DIAGNOSIS — R7303 Prediabetes: Secondary | ICD-10-CM | POA: Diagnosis not present

## 2022-10-03 DIAGNOSIS — K222 Esophageal obstruction: Secondary | ICD-10-CM | POA: Diagnosis not present

## 2022-10-03 DIAGNOSIS — K317 Polyp of stomach and duodenum: Secondary | ICD-10-CM

## 2022-10-03 DIAGNOSIS — R1013 Epigastric pain: Secondary | ICD-10-CM | POA: Diagnosis not present

## 2022-10-03 MED ORDER — SODIUM CHLORIDE 0.9 % IV SOLN
500.0000 mL | Freq: Once | INTRAVENOUS | Status: DC
Start: 2022-10-03 — End: 2022-10-03

## 2022-10-03 NOTE — Progress Notes (Signed)
Uneventful anesthetic. Report to pacu rn. Vss. Care resumed by rn. 

## 2022-10-03 NOTE — Progress Notes (Signed)
[facility] History and Physical   Primary Care Physician:  Eustaquio Boyden, MD   Reason for Procedure:   epigastric pain, esophageal spasm  Plan:    EGD     HPI: Mark Martinez is a 72 y.o. male  here for EGD to evaluate epigastric pain / spasm symptoms. On nexium and pepcid. Has had intermittent epigastric pain and possible spasm, not related to eating, bothers him intermittently. Belching can improve it. Hiatal hernia noted in the past. No dysphagia at this time. EGD to further evaluate.    Otherwise feels well without any cardiopulmonary symptoms.   I have discussed risks / benefits of anesthesia and endoscopic procedure with Mark Martinez and they wish to proceed with the exams as outlined today.    Past Medical History:  Diagnosis Date   Cataract    COVID-19 virus infection 06/2019   ED (erectile dysfunction)    Elevated LFTs    Esophageal stricture    GERD (gastroesophageal reflux disease)    with hiatal hernia   H/O hiatal hernia    History of prostatitis    HLD (hyperlipidemia)    Prediabetes 07/28/2015   Shortness of breath    exersion   Tubular adenoma of colon     Past Surgical History:  Procedure Laterality Date   BACK SURGERY  1994   lumbar area   CHOLECYSTECTOMY     COLONOSCOPY  2005   WNL, rpt 10 yrs   COLONOSCOPY  07/2019   1 TA, hemorrhoids, rpt 7 yrs Christella Hartigan)   CYST REMOVAL NECK Left 08/31/2013   Procedure: EXCISION EPIDERMAL INCLUSION CYST LEFT NECK;  Surgeon: Liz Malady, MD   ESOPHAGEAL DILATION     three last   ESOPHAGOGASTRODUODENOSCOPY  12-27-1996   hiatal hernia (Dr. Jarold Motto)   SHOULDER ARTHROSCOPY WITH ROTATOR CUFF REPAIR AND SUBACROMIAL DECOMPRESSION Right 01/2020   distal clavicle resection, RTC repair (Supple)    Prior to Admission medications   Medication Sig Start Date End Date Taking? Authorizing Provider  cetirizine (ZYRTEC) 10 MG tablet Take 1 tablet (10 mg total) by mouth daily. 08/25/21  Yes Eden Emms, NP  cholestyramine Lanetta Inch) 4 g packet Take 1 packet (4 g total) by mouth 2 (two) times daily. 07/23/22  Yes Eustaquio Boyden, MD  famotidine (PEPCID) 20 MG tablet Take 20 mg by mouth in the morning and at bedtime.   Yes [provider]  Misc Natural Products (OSTEO BI-FLEX JOINT SHIELD PO) Take by mouth daily.   Yes [provider]  sildenafil (REVATIO) 20 MG tablet TAKE 3 TO 5 TABLETS BY MOUTH EVERY DAY AS NEEDED 09/26/22  Yes Eustaquio Boyden, MD  acetaminophen (TYLENOL) 325 MG tablet Take 650 mg by mouth every 6 (six) hours as needed for moderate pain.     [provider]  esomeprazole (NEXIUM) 40 MG capsule Take 1 capsule (40 mg total) by mouth 2 (two) times daily before a meal. 08/31/22 09/30/22  Doree Albee, PA-C  POTASSIUM PO Take by mouth daily.    [provider]    Current Outpatient Medications  Medication Sig Dispense Refill   cetirizine (ZYRTEC) 10 MG tablet Take 1 tablet (10 mg total) by mouth daily. 30 tablet 0   cholestyramine (QUESTRAN) 4 g packet Take 1 packet (4 g total) by mouth 2 (two) times daily. 180 each 4   famotidine (PEPCID) 20 MG tablet Take 20 mg by mouth in the morning and at bedtime.  Misc Natural Products (OSTEO BI-FLEX JOINT SHIELD PO) Take by mouth daily.     sildenafil (REVATIO) 20 MG tablet TAKE 3 TO 5 TABLETS BY MOUTH EVERY DAY AS NEEDED 60 tablet 11   acetaminophen (TYLENOL) 325 MG tablet Take 650 mg by mouth every 6 (six) hours as needed for moderate pain.      esomeprazole (NEXIUM) 40 MG capsule Take 1 capsule (40 mg total) by mouth 2 (two) times daily before a meal. 60 capsule 0   POTASSIUM PO Take by mouth daily.     Current Facility-Administered Medications  Medication Dose Route Frequency Provider Last Rate Last Admin   0.9 %  sodium chloride infusion  500 mL Intravenous Once Maleka Contino, Willaim Rayas, MD        Allergies as of 10/03/2022 - Review Complete 10/03/2022  Allergen Reaction Noted    Codeine Other (See Comments) 08/16/2014    Family History  Problem Relation Age of Onset   Valvular heart disease Mother    Ulcers Father    CAD Father 16       multiple stents   CAD Paternal Uncle    Cancer Neg Hx    Diabetes Neg Hx    Stroke Neg Hx    Colon cancer Neg Hx    Esophageal cancer Neg Hx    Pancreatic cancer Neg Hx    Liver disease Neg Hx    Stomach cancer Neg Hx     Social History   Socioeconomic History   Marital status: Married    Spouse name: Not on file   Number of children: 3   Years of education: Not on file   Highest education level: Not on file  Occupational History   Occupation: seeding and Surveyor, mining: OTHER  Tobacco Use   Smoking status: Never    Passive exposure: Never   Smokeless tobacco: Former    Quit date: 01/30/2009  Vaping Use   Vaping Use: Never used  Substance and Sexual Activity   Alcohol use: No   Drug use: No   Sexual activity: Not Currently  Other Topics Concern   Not on file  Social History Narrative   Caffeine: rare   Lives with wife, 1 dog.   Grown children   Occupation: Works in Aeronautical engineer.   Activity: stays active at work   Diet: daily fruits/vegetables, some water   Social Determinants of Health   Financial Resource Strain: Low Risk  (06/21/2022)   Overall Financial Resource Strain (CARDIA)    Difficulty of Paying Living Expenses: Not hard at all  Food Insecurity: No Food Insecurity (06/21/2022)   Hunger Vital Sign    Worried About Running Out of Food in the Last Year: Never true    Ran Out of Food in the Last Year: Never true  Transportation Needs: No Transportation Needs (06/21/2022)   PRAPARE - Administrator, Civil Service (Medical): No    Lack of Transportation (Non-Medical): No  Physical Activity: Inactive (06/21/2022)   Exercise Vital Sign    Days of Exercise per Week: 0 days    Minutes of Exercise per Session: 0 min  Stress: No Stress Concern Present (06/21/2022)   [employer] of [employer]ational [employer] - Occupational Stress Questionnaire    Feeling of Stress : Not at all  Social Connections: Socially Integrated (06/21/2022)   Social Connection and Isolation Panel [NHANES]    Frequency of Communication with Friends and Family: More than three times a  week    Frequency of Social Gatherings with Friends and Family: More than three times a week    Attends Religious Services: More than 4 times per year    Active Member of [employer] or Organizations: Yes    Attends Banker Meetings: 1 to 4 times per year    Marital Status: Married  Catering manager Violence: Not At Risk (06/21/2022)   Humiliation, Afraid, Rape, and Kick questionnaire    Fear of Current or Ex-Partner: No    Emotionally Abused: No    Physically Abused: No    Sexually Abused: No    Review of Systems: All other review of systems negative except as mentioned in the HPI.  Physical Exam: Vital signs BP 104/81   Pulse 66   Temp 98.7 F (37.1 C) (Temporal)   Resp 12   Ht  (1.803 m)   Wt 210 lb (95.3 kg)   SpO2 94%   BMI 29.29 kg/m   General:   Alert,  Well-developed, pleasant and cooperative in NAD Lungs:  Clear throughout to auscultation.   Heart:  Regular rate and rhythm Abdomen:  Soft, nontender and nondistended.   Neuro/Psych:  Alert and cooperative. Normal mood and affect. A and O x 3  Harlin Rain, MD [facility]

## 2022-10-03 NOTE — Progress Notes (Signed)
Pt's states no medical or surgical changes since previsit or office visit. 

## 2022-10-03 NOTE — Patient Instructions (Signed)
Handouts on GERD/Hiatal Hernia and polyps given to patient. Await pathology results Resume previous diet and continue present medications - no ibuprofen, naproxen, or other non-steroidal anti-inflammatory medications for 2 weeks after polyp removal  Trail of peppermint althoids - place 2 sublingual at onset of symptoms to see if this helps abort pain, follow up if symptoms persist    YOU HAD AN ENDOSCOPIC PROCEDURE TODAY AT THE [facility]:   Refer to the procedure report that was given to you for any specific questions about what was found during the examination.  If the procedure report does not answer your questions, please call your gastroenterologist to clarify.  If you requested that your care partner not be given the details of your procedure findings, then the procedure report has been included in a sealed envelope for you to review at your convenience later.  YOU SHOULD EXPECT: Some feelings of bloating in the abdomen. Passage of more gas than usual.  Walking can help get rid of the air that was put into your GI tract during the procedure and reduce the bloating. If you had a lower endoscopy (such as a colonoscopy or flexible sigmoidoscopy) you may notice spotting of blood in your stool or on the toilet paper. If you underwent a bowel prep for your procedure, you may not have a normal bowel movement for a few days.  Please Note:  You might notice some irritation and congestion in your nose or some drainage.  This is from the oxygen used during your procedure.  There is no need for concern and it should clear up in a day or so.  SYMPTOMS TO REPORT IMMEDIATELY:   Following upper endoscopy (EGD)  Vomiting of blood or coffee ground material  New chest pain or pain under the shoulder blades  Painful or persistently difficult swallowing  New shortness of breath  Fever of 100F or higher  Black, tarry-looking stools  For urgent or emergent issues, a gastroenterologist can be  reached at any hour by calling [phone]. Do not use MyChart messaging for urgent concerns.    DIET:  We do recommend a small meal at first, but then you may proceed to your regular diet.  Drink plenty of fluids but you should avoid alcoholic beverages for 24 hours.  ACTIVITY:  You should plan to take it easy for the rest of today and you should NOT DRIVE or use heavy machinery until tomorrow (because of the sedation medicines used during the test).    FOLLOW UP: Our staff will call the number listed on your records the next business day following your procedure.  We will call around 7:15- 8:00 am to check on you and address any questions or concerns that you may have regarding the information given to you following your procedure. If we do not reach you, we will leave a message.     If any biopsies were taken you will be contacted by phone or by letter within the next 1-3 weeks.  Please call us at [phone] if you have not heard about the biopsies in 3 weeks.    SIGNATURES/CONFIDENTIALITY: You and/or your care partner have signed paperwork which will be entered into your electronic medical record.  These signatures attest to the fact that that the information above on your After Visit Summary has been reviewed and is understood.  Full responsibility of the confidentiality of this discharge information lies with you and/or your care-partner.

## 2022-10-03 NOTE — Op Note (Signed)
[facility] Patient Name: Mark Martinez Procedure Date: 10/03/2022 9:56 AM MRN: [id number] Endoscopist: Viviann Spare P. Adela Lank , MD, [id number] Age: 72 Referring MD:  Date of Birth: 02-24-1951 Gender: Male Account #: [id number] Procedure:                Upper GI endoscopy Indications:              intermittent epigastric abdominal pain / lower                            chest discomfort - relieved with belching - GERD                            has been controlled on nexium / pepcid, no dysphagia Medicines:                Monitored Anesthesia Care Procedure:                Pre-Anesthesia Assessment:                           - Prior to the procedure, a History and Physical                            was performed, and patient medications and                            allergies were reviewed. The patient's tolerance of                            previous anesthesia was also reviewed. The risks                            and benefits of the procedure and the sedation                            options and risks were discussed with the patient.                            All questions were answered, and informed consent                            was obtained. Prior Anticoagulants: The patient has                            taken no anticoagulant or antiplatelet agents. ASA                            Grade Assessment: II - A patient with mild systemic                            disease. After reviewing the risks and benefits,                            the patient was deemed in satisfactory condition to  undergo the procedure.                           After obtaining informed consent, the endoscope was                            passed under direct vision. Throughout the                            procedure, the patient's blood pressure, pulse, and                            oxygen saturations were monitored continuously. The                             GIF [id number] #[id number] was introduced through the                            mouth, and advanced to the second part of duodenum.                            The upper GI endoscopy was accomplished without                            difficulty. The patient tolerated the procedure                            well. Scope In: Scope Out: Findings:                 Esophagogastric landmarks were identified: the                            Z-line was found at 36 cm, the gastroesophageal                            junction was found at 36 cm and the upper extent of                            the gastric folds was found at 40 cm from the                            incisors.                           A 4 cm hiatal hernia was present.                           A widely patent Schatzki ring was found at the                            gastroesophageal junction. Dilation not performed                            given no dysphagia.  The exam of the esophagus was otherwise normal.                           Multiple sessile polyps were found in the gastric                            fundus, in the gastric body and in the gastric                            antrum. Likely benign fundic gland polyps in the                            setting of PPI use.                           A single 6 to 7 mm sessile polyp with stigmata of                            recent bleeding was found in the gastric antrum -                            adherent heme, ulcerated. Likely inflammatory                            polyp. The polyp was removed with a hot snare given                            stigmata of bleeding. Resection and retrieval were                            complete. There was some oozing post polypectomy.                            One hemostatic clip was successfully placed across                            the base of the polypectomy site.                           The exam of the stomach  was otherwise normal.                           Biopsies were taken with a cold forceps for                            Helicobacter pylori testing.                           Mucosal changes characterized by nodularity were                            found in the duodenal bulb, grossly consistent with  benign ectopic gastric mucosa.                           The exam of the duodenum was otherwise normal. Complications:            No immediate complications. Estimated blood loss:                            Minimal. Estimated Blood Loss:     Estimated blood loss was minimal. Impression:               - Esophagogastric landmarks identified.                           - 4 cm hiatal hernia.                           - Widely patent Schatzki ring.                           - Normal esophagus otherwise                           - Multiple gastric polyps, likely benign fundic                            gland polyps.                           - A single gastric polyp in the antrum as outlined.                            Resected and retrieved. Clip was placed.                           - Mucosal changes in the duodenum consistent with                            benign ectopic gastric mucosa.                           - Biopsies were taken with a cold forceps for                            Helicobacter pylori testing.                           - Normal exam otherwise.                           Symptoms are suspicious for possible esophageal                            spasm. Will discuss options with patient. Recommendation:           - Patient has a contact number available for  emergencies. The signs and symptoms of potential                            delayed complications were discussed with the                            patient. Return to normal activities tomorrow.                            Written discharge instructions were provided to the                             patient.                           - Resume previous diet.                           - Continue present medications.                           - Await pathology results.                           - No ibuprofen, naproxen, or other non-steroidal                            anti-inflammatory drugs for 2 weeks after polyp                            removal.                           - Trial of peppermint althoids - 2 placed                            sublinqual at onset of symptoms to see if this                            helps abort pain. Follow up if symptoms persist Viviann Spare P. Ronique Simerly, MD 10/03/2022 10:34:52 AM This report has been signed electronically.

## 2022-10-03 NOTE — Progress Notes (Signed)
Called to room to assist during endoscopic procedure.  Patient ID and intended procedure confirmed with present staff. Received instructions for my participation in the procedure from the performing physician.  

## 2022-10-04 ENCOUNTER — Telehealth: Payer: Self-pay

## 2022-10-04 NOTE — Telephone Encounter (Signed)
  Follow up Call-     10/03/2022    9:10 AM 10/18/2020    9:24 AM  Call back number  Post procedure Call Back phone  # [phone]  Permission to leave phone message Yes Yes     Patient questions:  Do you have a fever, pain , or abdominal swelling? No. Pain Score  0 *  Have you tolerated food without any problems? Yes.    Have you been able to return to your normal activities? Yes.    Do you have any questions about your discharge instructions: Diet   No. Medications  No. Follow up visit  No.  Do you have questions or concerns about your Care? No.  Actions: * If pain score is 4 or above: No action needed, pain <4.

## 2022-10-09 ENCOUNTER — Other Ambulatory Visit: Payer: Self-pay | Admitting: Physician Assistant

## 2022-10-12 ENCOUNTER — Encounter: Payer: Self-pay | Admitting: Gastroenterology

## 2022-10-29 DIAGNOSIS — M5136 Other intervertebral disc degeneration, lumbar region: Secondary | ICD-10-CM | POA: Diagnosis not present

## 2022-10-29 DIAGNOSIS — M5416 Radiculopathy, lumbar region: Secondary | ICD-10-CM | POA: Diagnosis not present

## 2022-10-29 DIAGNOSIS — M9905 Segmental and somatic dysfunction of pelvic region: Secondary | ICD-10-CM | POA: Diagnosis not present

## 2022-10-29 DIAGNOSIS — M9903 Segmental and somatic dysfunction of lumbar region: Secondary | ICD-10-CM | POA: Diagnosis not present

## 2022-11-16 ENCOUNTER — Other Ambulatory Visit: Payer: Self-pay | Admitting: Physician Assistant

## 2022-11-26 DIAGNOSIS — M9903 Segmental and somatic dysfunction of lumbar region: Secondary | ICD-10-CM | POA: Diagnosis not present

## 2022-11-26 DIAGNOSIS — M9905 Segmental and somatic dysfunction of pelvic region: Secondary | ICD-10-CM | POA: Diagnosis not present

## 2022-11-26 DIAGNOSIS — M5136 Other intervertebral disc degeneration, lumbar region: Secondary | ICD-10-CM | POA: Diagnosis not present

## 2022-11-26 DIAGNOSIS — M5416 Radiculopathy, lumbar region: Secondary | ICD-10-CM | POA: Diagnosis not present

## 2022-12-12 ENCOUNTER — Other Ambulatory Visit: Payer: Self-pay | Admitting: Physician Assistant

## 2022-12-24 DIAGNOSIS — M5136 Other intervertebral disc degeneration, lumbar region: Secondary | ICD-10-CM | POA: Diagnosis not present

## 2022-12-24 DIAGNOSIS — M9905 Segmental and somatic dysfunction of pelvic region: Secondary | ICD-10-CM | POA: Diagnosis not present

## 2022-12-24 DIAGNOSIS — M9903 Segmental and somatic dysfunction of lumbar region: Secondary | ICD-10-CM | POA: Diagnosis not present

## 2022-12-24 DIAGNOSIS — M5416 Radiculopathy, lumbar region: Secondary | ICD-10-CM | POA: Diagnosis not present

## 2023-01-21 DIAGNOSIS — M5136 Other intervertebral disc degeneration, lumbar region: Secondary | ICD-10-CM | POA: Diagnosis not present

## 2023-01-21 DIAGNOSIS — M9903 Segmental and somatic dysfunction of lumbar region: Secondary | ICD-10-CM | POA: Diagnosis not present

## 2023-01-21 DIAGNOSIS — M9905 Segmental and somatic dysfunction of pelvic region: Secondary | ICD-10-CM | POA: Diagnosis not present

## 2023-01-21 DIAGNOSIS — M5416 Radiculopathy, lumbar region: Secondary | ICD-10-CM | POA: Diagnosis not present

## 2023-02-19 DIAGNOSIS — M9905 Segmental and somatic dysfunction of pelvic region: Secondary | ICD-10-CM | POA: Diagnosis not present

## 2023-02-19 DIAGNOSIS — M5416 Radiculopathy, lumbar region: Secondary | ICD-10-CM | POA: Diagnosis not present

## 2023-02-19 DIAGNOSIS — M9903 Segmental and somatic dysfunction of lumbar region: Secondary | ICD-10-CM | POA: Diagnosis not present

## 2023-02-19 DIAGNOSIS — M5136 Other intervertebral disc degeneration, lumbar region: Secondary | ICD-10-CM | POA: Diagnosis not present

## 2023-03-19 DIAGNOSIS — M9903 Segmental and somatic dysfunction of lumbar region: Secondary | ICD-10-CM | POA: Diagnosis not present

## 2023-03-19 DIAGNOSIS — M9905 Segmental and somatic dysfunction of pelvic region: Secondary | ICD-10-CM | POA: Diagnosis not present

## 2023-03-19 DIAGNOSIS — M5416 Radiculopathy, lumbar region: Secondary | ICD-10-CM | POA: Diagnosis not present

## 2023-03-19 DIAGNOSIS — M5136 Other intervertebral disc degeneration, lumbar region with discogenic back pain only: Secondary | ICD-10-CM | POA: Diagnosis not present

## 2023-04-16 ENCOUNTER — Ambulatory Visit (INDEPENDENT_AMBULATORY_CARE_PROVIDER_SITE_OTHER): Payer: Medicare HMO | Admitting: Internal Medicine

## 2023-04-16 ENCOUNTER — Encounter: Payer: Self-pay | Admitting: Internal Medicine

## 2023-04-16 VITALS — BP 106/82 | HR 69 | Temp 98.1°F | Ht 71.5 in | Wt 213.0 lb

## 2023-04-16 DIAGNOSIS — M5416 Radiculopathy, lumbar region: Secondary | ICD-10-CM | POA: Diagnosis not present

## 2023-04-16 DIAGNOSIS — R3912 Poor urinary stream: Secondary | ICD-10-CM | POA: Diagnosis not present

## 2023-04-16 DIAGNOSIS — N401 Enlarged prostate with lower urinary tract symptoms: Secondary | ICD-10-CM

## 2023-04-16 DIAGNOSIS — N400 Enlarged prostate without lower urinary tract symptoms: Secondary | ICD-10-CM | POA: Insufficient documentation

## 2023-04-16 DIAGNOSIS — M9905 Segmental and somatic dysfunction of pelvic region: Secondary | ICD-10-CM | POA: Diagnosis not present

## 2023-04-16 DIAGNOSIS — Z23 Encounter for immunization: Secondary | ICD-10-CM

## 2023-04-16 DIAGNOSIS — M9903 Segmental and somatic dysfunction of lumbar region: Secondary | ICD-10-CM | POA: Diagnosis not present

## 2023-04-16 DIAGNOSIS — M5136 Other intervertebral disc degeneration, lumbar region with discogenic back pain only: Secondary | ICD-10-CM | POA: Diagnosis not present

## 2023-04-16 LAB — POC URINALSYSI DIPSTICK (AUTOMATED)
Bilirubin, UA: NEGATIVE
Blood, UA: NEGATIVE
Glucose, UA: NEGATIVE
Ketones, UA: NEGATIVE
Leukocytes, UA: NEGATIVE
Nitrite, UA: NEGATIVE
Protein, UA: NEGATIVE
Spec Grav, UA: 1.025 (ref 1.010–1.025)
Urobilinogen, UA: 0.2 U/dL
pH, UA: 5.5 (ref 5.0–8.0)

## 2023-04-16 MED ORDER — TAMSULOSIN HCL 0.4 MG PO CAPS: 0.4000 mg | ORAL_CAPSULE | Freq: Every day | ORAL | 3 refills | Status: DC

## 2023-04-16 NOTE — Assessment & Plan Note (Addendum)
Urinalysis negative--nothing to suggest infection Discussed options Offered daily tadalafil---he was neutral Will try tamsulosin 0.4mg  daily Consider increase or change if not happy the result

## 2023-04-16 NOTE — Addendum Note (Signed)
Addended by: Eual Fines on: 04/16/2023 08:32 AM   Modules accepted: Orders

## 2023-04-16 NOTE — Addendum Note (Signed)
Addended by: Tillman Abide I on: 04/16/2023 08:25 AM   Modules accepted: Level of Service

## 2023-04-16 NOTE — Progress Notes (Signed)
Subjective:    Patient ID: Mark Martinez, male    DOB: 1950-06-30, 72 y.o.   MRN: [id number]  HPI Here due to urinary problems  Stream has been getting weaker for some time Now trouble starting--then it stops--then starts again Increased frequency in the morning Nocturia x 1 Dribbles  No blood in urine No fever Doesn't feel sick  Occasional burning with ejaculation Uses sildenafil once or twice a week  Current Outpatient Medications on File Prior to Visit  Medication Sig Dispense Refill   acetaminophen (TYLENOL) 325 MG tablet Take 650 mg by mouth every 6 (six) hours as needed for moderate pain.      cetirizine (ZYRTEC) 10 MG tablet Take 1 tablet (10 mg total) by mouth daily. 30 tablet 0   cholestyramine (QUESTRAN) 4 g packet Take 1 packet (4 g total) by mouth 2 (two) times daily. 180 each 4   esomeprazole (NEXIUM) 40 MG capsule Take 1 capsule (40 mg total) by mouth 2 (two) times daily before a meal. 180 capsule 1   famotidine (PEPCID) 20 MG tablet Take 20 mg by mouth in the morning and at bedtime.     Misc Natural Products (OSTEO BI-FLEX JOINT SHIELD PO) Take by mouth daily.     POTASSIUM PO Take by mouth daily.     sildenafil (REVATIO) 20 MG tablet TAKE 3 TO 5 TABLETS BY MOUTH EVERY DAY AS NEEDED 60 tablet 11   No current facility-administered medications on file prior to visit.    Allergies  Allergen Reactions   Codeine Other (See Comments)    Keeps him awake    Past Medical History:  Diagnosis Date   Cataract    COVID-19 virus infection 06/2019   ED (erectile dysfunction)    Elevated LFTs    Esophageal stricture    GERD (gastroesophageal reflux disease)    with hiatal hernia   H/O hiatal hernia    History of prostatitis    HLD (hyperlipidemia)    Prediabetes 07/28/2015   Shortness of breath    exersion   Tubular adenoma of colon     Past Surgical History:  Procedure Laterality Date   BACK SURGERY  1994   lumbar area   CHOLECYSTECTOMY     COLONOSCOPY   2005   WNL, rpt 10 yrs   COLONOSCOPY  07/2019   1 TA, hemorrhoids, rpt 7 yrs Christella Hartigan)   CYST REMOVAL NECK Left 08/31/2013   Procedure: EXCISION EPIDERMAL INCLUSION CYST LEFT NECK;  Surgeon: Liz Malady, MD   ESOPHAGEAL DILATION     three last   ESOPHAGOGASTRODUODENOSCOPY  12-27-1996   hiatal hernia (Dr. Jarold Motto)   SHOULDER ARTHROSCOPY WITH ROTATOR CUFF REPAIR AND SUBACROMIAL DECOMPRESSION Right 01/2020   distal clavicle resection, RTC repair (Supple)    Family History  Problem Relation Age of Onset   Valvular heart disease Mother    Ulcers Father    CAD Father 51       multiple stents   CAD Paternal Uncle    Cancer Neg Hx    Diabetes Neg Hx    Stroke Neg Hx    Colon cancer Neg Hx    Esophageal cancer Neg Hx    Pancreatic cancer Neg Hx    Liver disease Neg Hx    Stomach cancer Neg Hx     Social History   Socioeconomic History   Marital status: Married    Spouse name: Not on file   Number of children: 3  Years of education: Not on file   Highest education level: Not on file  Occupational History   Occupation: seeding and landscaping services    Employer: OTHER  Tobacco Use   Smoking status: Never    Passive exposure: Never   Smokeless tobacco: Former    Quit date: 01/30/2009  Vaping Use   Vaping status: Never Used  Substance and Sexual Activity   Alcohol use: No   Drug use: No   Sexual activity: Not Currently  Other Topics Concern   Not on file  Social History Narrative   Caffeine: rare   Lives with wife, 1 dog.   Grown children   Occupation: Works in Aeronautical engineer.   Activity: stays active at work   Diet: daily fruits/vegetables, some water   Social Determinants of Health   Financial Resource Strain: Low Risk  (06/21/2022)   Overall Financial Resource Strain (CARDIA)    Difficulty of Paying Living Expenses: Not hard at all  Food Insecurity: No Food Insecurity (06/21/2022)   Hunger Vital Sign    Worried About Running Out of Food in the Last Year:  Never true    Ran Out of Food in the Last Year: Never true  Transportation Needs: No Transportation Needs (06/21/2022)   PRAPARE - Administrator, Civil Service (Medical): No    Lack of Transportation (Non-Medical): No  Physical Activity: Inactive (06/21/2022)   Exercise Vital Sign    Days of Exercise per Week: 0 days    Minutes of Exercise per Session: 0 min  Stress: No Stress Concern Present (06/21/2022)   [employer] of Occupational Health - Occupational Stress Questionnaire    Feeling of Stress : Not at all  Social Connections: Socially Integrated (06/21/2022)   Social Connection and Isolation Panel [NHANES]    Frequency of Communication with Friends and Family: More than three times a week    Frequency of Social Gatherings with Friends and Family: More than three times a week    Attends Religious Services: More than 4 times per year    Active Member of [employer] or Organizations: Yes    Attends Banker Meetings: 1 to 4 times per year    Marital Status: Married  Catering manager Violence: Not At Risk (06/21/2022)   Humiliation, Afraid, Rape, and Kick questionnaire    Fear of Current or Ex-Partner: No    Emotionally Abused: No    Physically Abused: No    Sexually Abused: No   Review of Systems Less pain in RUQ abdomen--mostly if lying over it    Objective:   Physical Exam Constitutional:      Appearance: Normal appearance.  Genitourinary:    Testes: Normal.     Comments: Scrotum quiet No hernia Neurological:     Mental Status: He is alert.            Assessment & Plan:

## 2023-04-29 ENCOUNTER — Ambulatory Visit (INDEPENDENT_AMBULATORY_CARE_PROVIDER_SITE_OTHER): Payer: Medicare HMO | Admitting: Dermatology

## 2023-04-29 ENCOUNTER — Encounter: Payer: Self-pay | Admitting: Dermatology

## 2023-04-29 DIAGNOSIS — Z1283 Encounter for screening for malignant neoplasm of skin: Secondary | ICD-10-CM | POA: Diagnosis not present

## 2023-04-29 DIAGNOSIS — W908XXA Exposure to other nonionizing radiation, initial encounter: Secondary | ICD-10-CM | POA: Diagnosis not present

## 2023-04-29 DIAGNOSIS — L578 Other skin changes due to chronic exposure to nonionizing radiation: Secondary | ICD-10-CM | POA: Diagnosis not present

## 2023-04-29 DIAGNOSIS — L814 Other melanin hyperpigmentation: Secondary | ICD-10-CM

## 2023-04-29 DIAGNOSIS — L300 Nummular dermatitis: Secondary | ICD-10-CM | POA: Diagnosis not present

## 2023-04-29 DIAGNOSIS — L821 Other seborrheic keratosis: Secondary | ICD-10-CM

## 2023-04-29 DIAGNOSIS — D1801 Hemangioma of skin and subcutaneous tissue: Secondary | ICD-10-CM

## 2023-04-29 DIAGNOSIS — L570 Actinic keratosis: Secondary | ICD-10-CM

## 2023-04-29 DIAGNOSIS — D229 Melanocytic nevi, unspecified: Secondary | ICD-10-CM

## 2023-04-29 MED ORDER — CLOBETASOL PROPIONATE 0.05 % EX OINT
TOPICAL_OINTMENT | CUTANEOUS | 1 refills | Status: AC
Start: 2023-04-29 — End: ?

## 2023-04-29 NOTE — Progress Notes (Signed)
Follow-Up Visit   Subjective  Mark Martinez is a 72 y.o. male who presents for the following: Skin Cancer Screening and Upper Body Skin Exam  The patient presents for Upper Body Skin Exam (UBSE) for skin cancer screening and mole check. The patient has spots, moles and lesions to be evaluated, some may be new or changing and the patient may have concern these could be cancer.    The following portions of the chart were reviewed this encounter and updated as appropriate: medications, allergies, medical history  Review of Systems:  No other skin or systemic complaints except as noted in HPI or Assessment and Plan.  Objective  Well appearing patient in no apparent distress; mood and affect are within normal limits.  All skin waist up examined. Relevant physical exam findings are noted in the Assessment and Plan.  L nasal dorsum x 1, frontal scalp x 2, R cheek x 1, R arm x 5, L arm x 2, L dorsal hand x 2 (13) Erythematous thin papules/macules with gritty scale.     Assessment & Plan   AK (actinic keratosis) (13) L nasal dorsum x 1, frontal scalp x 2, R cheek x 1, R arm x 5, L arm x 2, L dorsal hand x 2  Actinic keratoses are precancerous spots that appear secondary to cumulative UV radiation exposure/sun exposure over time. They are chronic with expected duration over 1 year. A portion of actinic keratoses will progress to squamous cell carcinoma of the skin. It is not possible to reliably predict which spots will progress to skin cancer and so treatment is recommended to prevent development of skin cancer.  Recommend daily broad spectrum sunscreen SPF 30+ to sun-exposed areas, reapply every 2 hours as needed.  Recommend staying in the shade or wearing long sleeves, sun glasses (UVA+UVB protection) and wide brim hats (4-inch brim around the entire circumference of the hat). Call for new or changing lesions.   Destruction of lesion - L nasal dorsum x 1, frontal scalp x 2, R cheek x  1, R arm x 5, L arm x 2, L dorsal hand x 2 (13) Complexity: simple   Destruction method: cryotherapy   Informed consent: discussed and consent obtained   Timeout:  patient name, date of birth, surgical site, and procedure verified Lesion destroyed using liquid nitrogen: Yes   Region frozen until ice ball extended beyond lesion: Yes   Outcome: patient tolerated procedure well with no complications   Post-procedure details: wound care instructions given     Actinic Damage - Chronic condition, secondary to cumulative UV/sun exposure - diffuse scaly erythematous macules with underlying dyspigmentation - Recommend daily broad spectrum sunscreen SPF 30+ to sun-exposed areas, reapply every 2 hours as needed.  - Staying in the shade or wearing long sleeves, sun glasses (UVA+UVB protection) and wide brim hats (4-inch brim around the entire circumference of the hat) are also recommended for sun protection.  - Call for new or changing lesions.  Lentigines, Seborrheic Keratoses, Hemangiomas - Benign normal skin lesions - Benign-appearing - Call for any changes  Melanocytic Nevi - Tan-brown and/or pink-flesh-colored symmetric macules and papules - Benign appearing on exam today - Observation - Call clinic for new or changing moles - Recommend daily use of broad spectrum spf 30+ sunscreen to sun-exposed areas.   SEBORRHEIC KERATOSIS - Stuck-on, waxy, tan-brown papules and/or plaques - L forearm - Benign-appearing - Discussed benign etiology and prognosis. - Observe - Call for any changes  Nummular  dermatitis  Exam: Chest and arms clear today, pt states no rash on lower body 0% BSA  Chronic condition with duration or expected duration over one year. Currently well-controlled.  Atopic dermatitis (eczema) is a chronic, relapsing, pruritic condition that can significantly affect quality of life. It is often associated with allergic rhinitis and/or asthma and can require treatment with topical  medications, phototherapy, or in severe cases biologic injectable medication (Dupixent; Adbry) or Oral JAK inhibitors.  Treatment Plan: Continue Clobetasol 0.05% oint to aa's BID PRN. Avoid applying to face, groin, and axilla. Use as directed. Long-term use can cause thinning of the skin.  Recommend gentle skin care.   Skin cancer screening performed today.  Return in about 1 year (around 04/28/2024) for UBSE.  Maylene Roes, CMA, am acting as scribe for Elie Goody, MD .   Documentation: I have reviewed the above documentation for accuracy and completeness, and I agree with the above.  Elie Goody, MD

## 2023-04-29 NOTE — Progress Notes (Deleted)
   Follow-Up Visit   Subjective  Mark Martinez is a 72 y.o. male who presents for the following: Skin Cancer Screening and Full Body Skin Exam  The patient presents for Total-Body Skin Exam (TBSE) for skin cancer screening and mole check. The patient has spots, moles and lesions to be evaluated, some may be new or changing and the patient may have concern these could be cancer.    The following portions of the chart were reviewed this encounter and updated as appropriate: medications, allergies, medical history  Review of Systems:  No other skin or systemic complaints except as noted in HPI or Assessment and Plan.  Objective  Well appearing patient in no apparent distress; mood and affect are within normal limits.  A full examination was performed including scalp, head, eyes, ears, nose, lips, neck, chest, axillae, abdomen, back, buttocks, bilateral upper extremities, bilateral lower extremities, hands, feet, fingers, toes, fingernails, and toenails. All findings within normal limits unless otherwise noted below.   Relevant physical exam findings are noted in the Assessment and Plan.    Assessment & Plan   SKIN CANCER SCREENING PERFORMED TODAY.  ACTINIC DAMAGE - Chronic condition, secondary to cumulative UV/sun exposure - diffuse scaly erythematous macules with underlying dyspigmentation - Recommend daily broad spectrum sunscreen SPF 30+ to sun-exposed areas, reapply every 2 hours as needed.  - Staying in the shade or wearing long sleeves, sun glasses (UVA+UVB protection) and wide brim hats (4-inch brim around the entire circumference of the hat) are also recommended for sun protection.  - Call for new or changing lesions.  LENTIGINES, SEBORRHEIC KERATOSES, HEMANGIOMAS - Benign normal skin lesions - Benign-appearing - Call for any changes  MELANOCYTIC NEVI - Tan-brown and/or pink-flesh-colored symmetric macules and papules - Benign appearing on exam today - Observation -  Call clinic for new or changing moles - Recommend daily use of broad spectrum spf 30+ sunscreen to sun-exposed areas.    Return in about 1 year (around 04/28/2024) for TBSE.  Maylene Roes, CMA, am acting as scribe for Elie Goody, MD .   Documentation: I have reviewed the above documentation for accuracy and completeness, and I agree with the above.  Elie Goody, MD

## 2023-04-29 NOTE — Patient Instructions (Signed)

## 2023-05-02 ENCOUNTER — Encounter: Payer: Medicare HMO | Admitting: Dermatology

## 2023-06-20 ENCOUNTER — Ambulatory Visit (INDEPENDENT_AMBULATORY_CARE_PROVIDER_SITE_OTHER): Payer: Medicare HMO | Admitting: Dermatology

## 2023-06-20 ENCOUNTER — Encounter: Payer: Self-pay | Admitting: Dermatology

## 2023-06-20 DIAGNOSIS — B079 Viral wart, unspecified: Secondary | ICD-10-CM | POA: Diagnosis not present

## 2023-06-20 IMAGING — DX DG CHEST 2V
2 series · 2 of 2 positions shown · non-contrast
Comparison: April 15, 2018.

CLINICAL DATA: Cough, dyspnea.

EXAM:
CHEST - 2 VIEW

[chest pa]
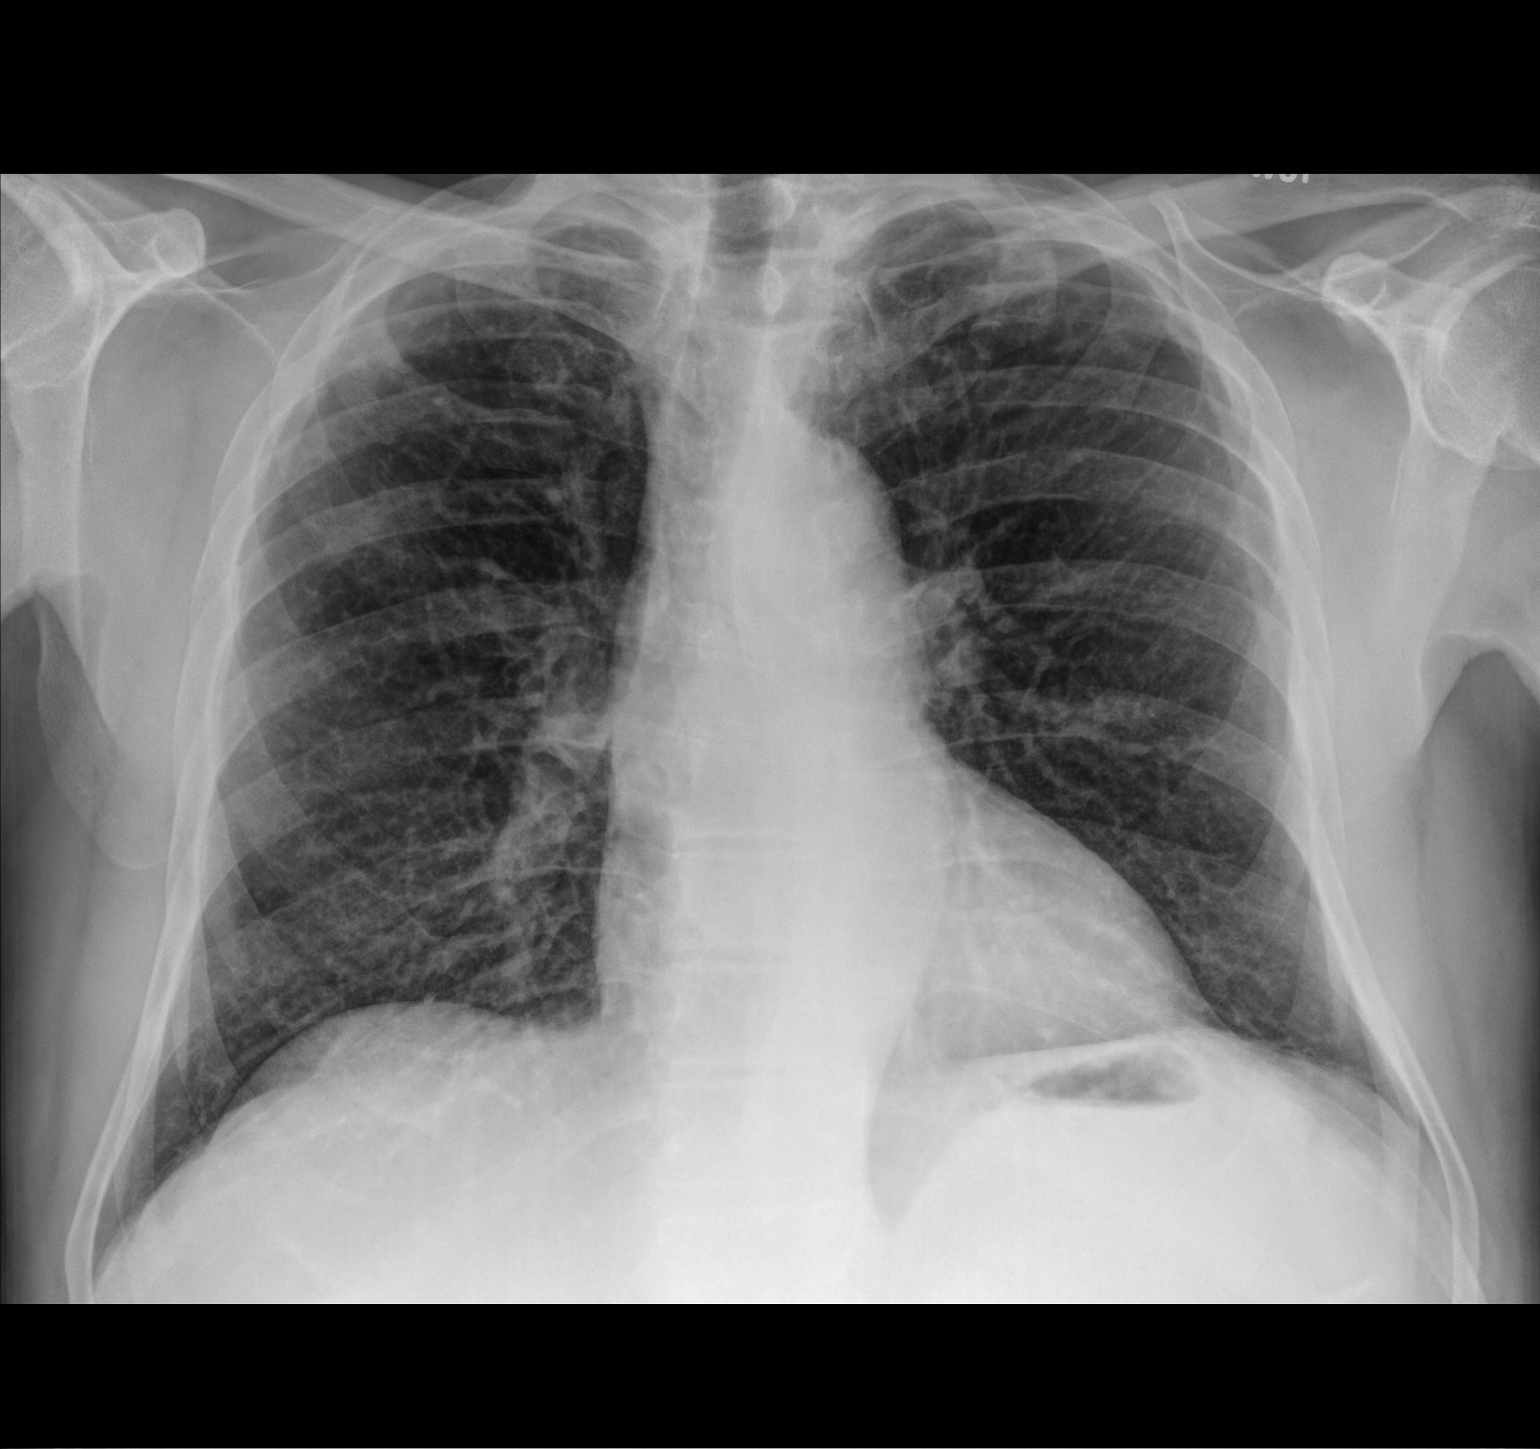

[chest lat]
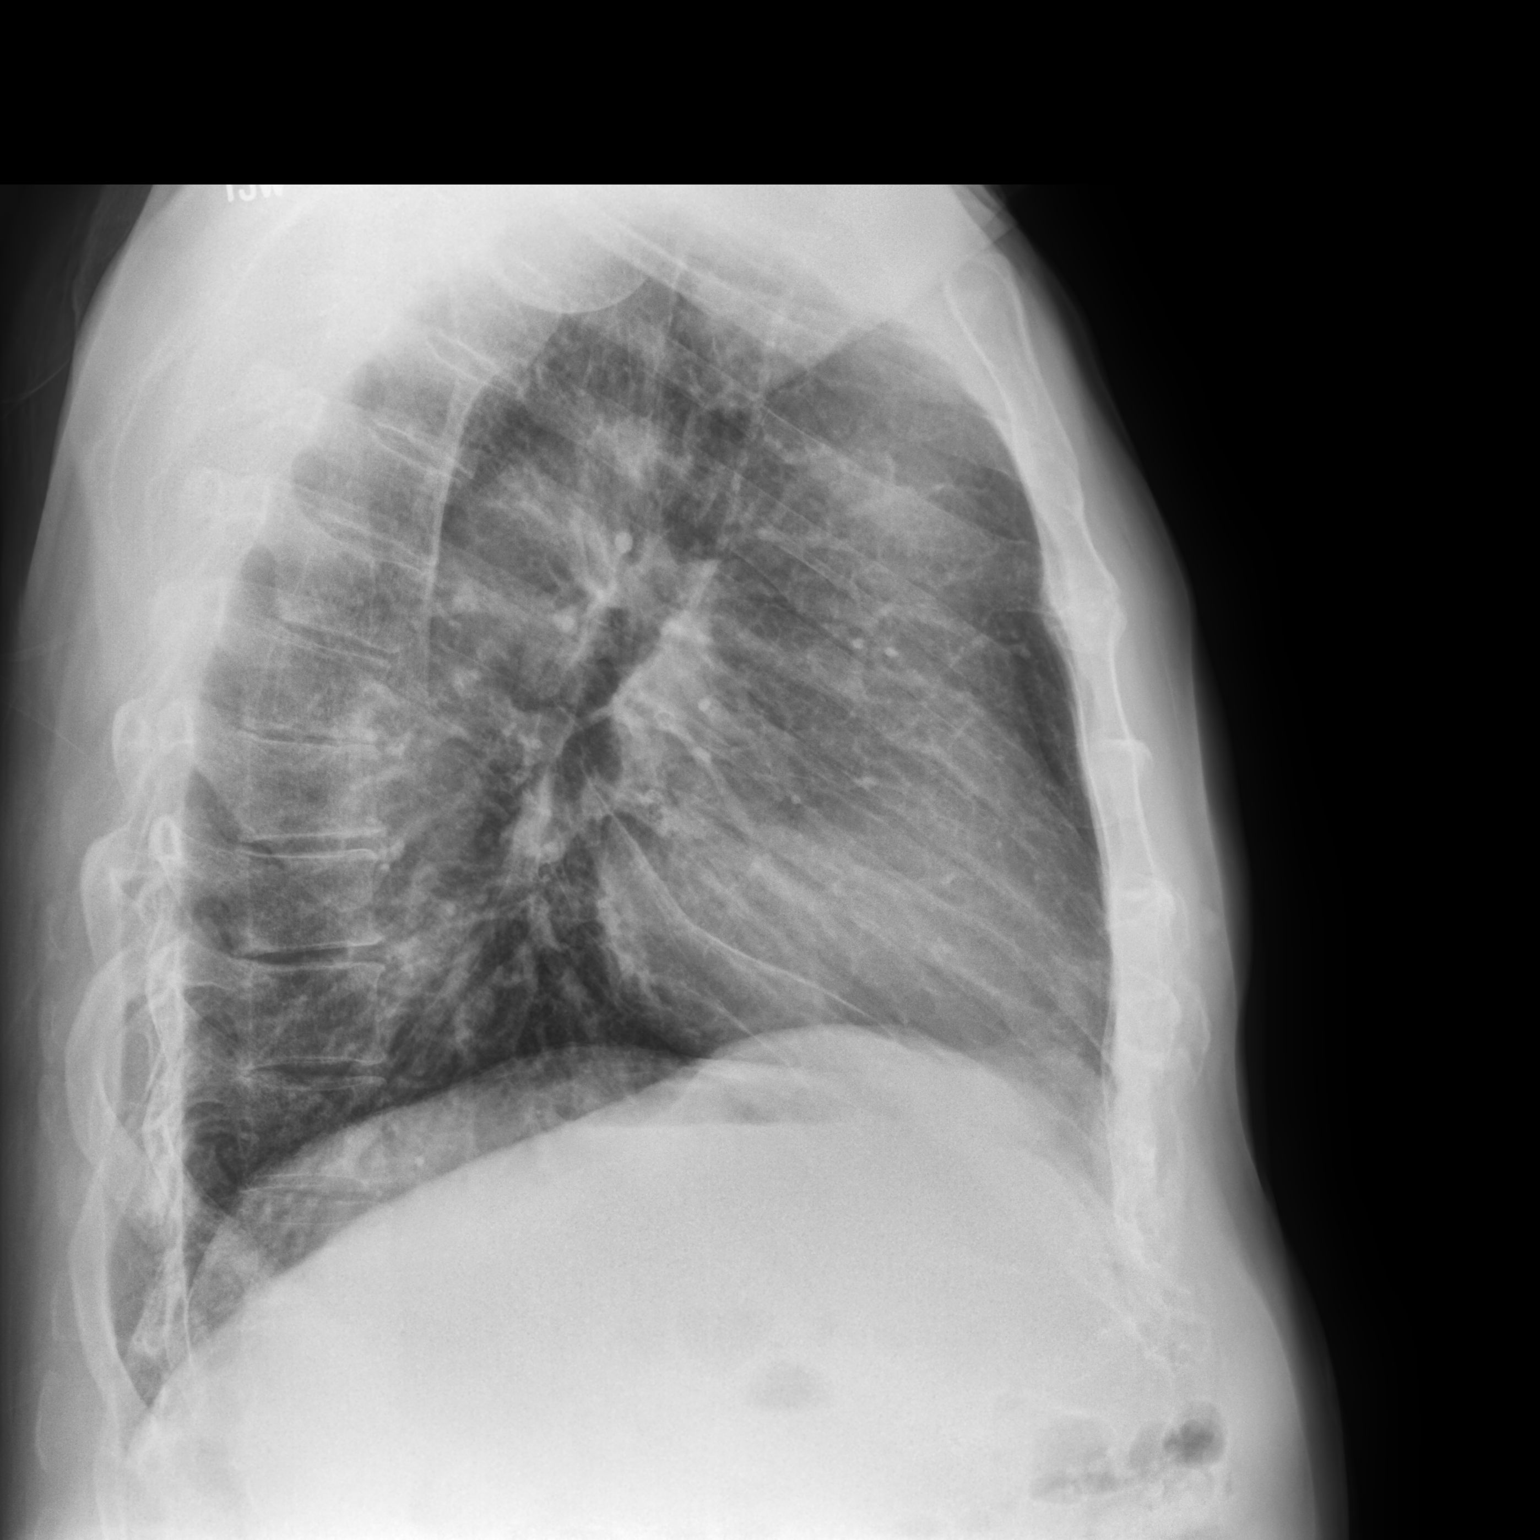

[2 of 2 positions shown; findings below may reference images not displayed]

FINDINGS: The heart size and mediastinal contours are within normal limits.
Ill-defined nodular opacity is noted in right upper lobe. Left lung
is unremarkable. The visualized skeletal structures are
unremarkable.
IMPRESSION: Ill-defined nodular opacity seen in right upper lobe. CT scan of the
chest is recommended for further evaluation. These results will be
called to the ordering clinician or representative by the
Radiologist Assistant, and communication documented in the PACS or
zVision Dashboard.

## 2023-06-20 NOTE — Progress Notes (Signed)
   Follow-Up Visit   Subjective  Mark Martinez is a 73 y.o. male who presents for the following: Irregular skin lesion on the R lower eyelid x 5 weeks. Patient would like checked and treated today.  The following portions of the chart were reviewed this encounter and updated as appropriate: medications, allergies, medical history  Review of Systems:  No other skin or systemic complaints except as noted in HPI or Assessment and Plan.  Objective  Well appearing patient in no apparent distress; mood and affect are within normal limits.   A focused examination was performed of the following areas: the face   Relevant exam findings are noted in the Assessment and Plan.  R lower eyelid Verrucous papule -- Discussed viral etiology and contagion.   Assessment & Plan     VIRAL WARTS, UNSPECIFIED TYPE R lower eyelid FiliformViral Wart (HPV) Counseling  Discussed viral / HPV (Human Papilloma Virus) etiology and risk of spread /infectivity to other areas of body as well as to other people.  Multiple treatments and methods may be required to clear warts and it is possible treatment may not be successful.  Treatment risks include discoloration; scarring and there is still potential for wart recurrence.  Patient to RTC if not resolved with this treatment. Will treat again, if needed, at no additional cost. Destruction of lesion - R lower eyelid Complexity: simple   Destruction method: cryotherapy   Informed consent: discussed and consent obtained   Timeout:  patient name, date of birth, surgical site, and procedure verified Lesion destroyed using liquid nitrogen: Yes   Region frozen until ice ball extended beyond lesion: Yes   Outcome: patient tolerated procedure well with no complications   Post-procedure details: wound care instructions given    Return for appointment as scheduled.  LILLETTE Rosina Mayans, CMA, am acting as scribe for Boneta Sharps, MD .   Documentation: I have  reviewed the above documentation for accuracy and completeness, and I agree with the above.  Boneta Sharps, MD

## 2023-06-20 NOTE — Patient Instructions (Signed)

## 2023-06-21 ENCOUNTER — Telehealth: Payer: Self-pay | Admitting: Family Medicine

## 2023-06-21 NOTE — Progress Notes (Deleted)
 Subjective:   Mark Martinez is a 73 y.o. male who presents for Medicare Annual/Subsequent preventive examination.  Visit Complete: Virtual I connected with  Mark Martinez on 06/21/23 by a audio enabled telemedicine application and verified that I am speaking with the correct person using two identifiers.  Patient Location: Home  Provider Location: Home Office  I discussed the limitations of evaluation and management by telemedicine. The patient expressed understanding and agreed to proceed.  Vital Signs: Because this visit was a virtual/telehealth visit, some criteria may be missing or patient reported. Any vitals not documented were not able to be obtained and vitals that have been documented are patient reported.  Patient Medicare AWV questionnaire was completed by the patient on ***; I have confirmed that all information answered by patient is correct and no changes since this date.      Objective:    There were no vitals filed for this visit. There is no height or weight on file to calculate BMI.     06/21/2022    9:04 AM 06/26/2021    9:31 AM 06/20/2021    9:44 AM 06/16/2021   11:59 AM 06/15/2020   12:01 PM 05/10/2020    8:00 PM 05/27/2019    9:20 PM  Advanced Directives  Does Patient Have a Medical Advance Directive? Yes Yes  Yes Yes Yes Yes  Type of Estate Agent of [place];Living will Living will Living will Healthcare Power of [place];Living will Healthcare Power of [place];Living will Living will Living will  Does patient want to make changes to medical advance directive?    Yes (MAU/Ambulatory/Procedural Areas - Information given)  No - Patient declined   Copy of Healthcare Power of Attorney in Chart? No - copy requested  No - copy requested  No - copy requested      Current Medications (verified) Outpatient Encounter Medications as of 06/21/2023  Medication Sig   acetaminophen  (TYLENOL ) 325 MG tablet Take 650 mg by mouth every 6 (six) hours  as needed for moderate pain.    cetirizine  (ZYRTEC ) 10 MG tablet Take 1 tablet (10 mg total) by mouth daily.   cholestyramine  (QUESTRAN ) 4 g packet Take 1 packet (4 g total) by mouth 2 (two) times daily.   clobetasol  ointment (TEMOVATE ) 0.05 % Apply to aa's rash QD-BID PRN. Avoid applying to face, groin, and axilla. Use as directed. Long-term use can cause thinning of the skin.   esomeprazole  (NEXIUM ) 40 MG capsule Take 1 capsule (40 mg total) by mouth 2 (two) times daily before a meal.   famotidine (PEPCID) 20 MG tablet Take 20 mg by mouth in the morning and at bedtime.   Misc Natural Products (OSTEO BI-FLEX JOINT SHIELD PO) Take by mouth daily.   POTASSIUM PO Take by mouth daily.   sildenafil  (REVATIO ) 20 MG tablet TAKE 3 TO 5 TABLETS BY MOUTH EVERY DAY AS NEEDED   tamsulosin  (FLOMAX ) 0.4 MG CAPS capsule Take 1 capsule (0.4 mg total) by mouth daily.   No facility-administered encounter medications on file as of 06/21/2023.    Allergies (verified) Codeine    History: Past Medical History:  Diagnosis Date   Cataract    COVID-19 virus infection 06/2019   ED (erectile dysfunction)    Elevated LFTs    Esophageal stricture    GERD (gastroesophageal reflux disease)    with hiatal hernia   H/O hiatal hernia    History of prostatitis    HLD (hyperlipidemia)    Prediabetes 07/28/2015  Shortness of breath    exersion   Tubular adenoma of colon    Past Surgical History:  Procedure Laterality Date   BACK SURGERY  1994   lumbar area   CHOLECYSTECTOMY     COLONOSCOPY  2005   WNL, rpt 10 yrs   COLONOSCOPY  07/2019   1 TA, hemorrhoids, rpt 7 yrs Marianne)   CYST REMOVAL NECK Left 08/31/2013   Procedure: EXCISION EPIDERMAL INCLUSION CYST LEFT NECK;  Surgeon: Dann FORBES Hummer, MD   ESOPHAGEAL DILATION     three last   ESOPHAGOGASTRODUODENOSCOPY  12-27-1996   hiatal hernia (Dr. Jakie)   SHOULDER ARTHROSCOPY WITH ROTATOR CUFF REPAIR AND SUBACROMIAL DECOMPRESSION Right 01/2020   distal  clavicle resection, RTC repair (Supple)   Family History  Problem Relation Age of Onset   Valvular heart disease Mother    Ulcers Father    CAD Father 45       multiple stents   CAD Paternal Uncle    Cancer Neg Hx    Diabetes Neg Hx    Stroke Neg Hx    Colon cancer Neg Hx    Esophageal cancer Neg Hx    Pancreatic cancer Neg Hx    Liver disease Neg Hx    Stomach cancer Neg Hx    Social History   Socioeconomic History   Marital status: Married    Spouse name: Not on file   Number of children: 3   Years of education: Not on file   Highest education level: Not on file  Occupational History   Occupation: seeding and landscaping services    Employer: OTHER  Tobacco Use   Smoking status: Never    Passive exposure: Never   Smokeless tobacco: Former    Quit date: 01/30/2009  Vaping Use   Vaping status: Never Used  Substance and Sexual Activity   Alcohol use: No   Drug use: No   Sexual activity: Not Currently  Other Topics Concern   Not on file  Social History Narrative   Caffeine: rare   Lives with wife, 1 dog.   Grown children   Occupation: Works in aeronautical engineer.   Activity: stays active at work   Diet: daily fruits/vegetables, some water   Social Drivers of Health   Financial Resource Strain: Low Risk  (06/21/2022)   Overall Financial Resource Strain (CARDIA)    Difficulty of Paying Living Expenses: Not hard at all  Food Insecurity: No Food Insecurity (06/21/2022)   Hunger Vital Sign    Worried About Running Out of Food in the Last Year: Never true    Ran Out of Food in the Last Year: Never true  Transportation Needs: No Transportation Needs (06/21/2022)   PRAPARE - Administrator, Civil Service (Medical): No    Lack of Transportation (Non-Medical): No  Physical Activity: Inactive (06/21/2022)   Exercise Vital Sign    Days of Exercise per Week: 0 days    Minutes of Exercise per Session: 0 min  Stress: No Stress Concern Present (06/21/2022)   [employer] - Occupational Stress Questionnaire    Feeling of Stress : Not at all  Social Connections: Socially Integrated (06/21/2022)   Social Connection and Isolation Panel [NHANES]    Frequency of Communication with Friends and Family: More than three times a week    Frequency of Social Gatherings with Friends and Family: More than three times a week    Attends Religious Services: More than 4  times per year    Active Member of Clubs or Organizations: Yes    Attends Banker Meetings: 1 to 4 times per year    Marital Status: Married    Tobacco Counseling Counseling given: Not Answered   Clinical Intake:                        Activities of Daily Living     No data to display          Patient Care Team: Rilla Baller, MD as PCP - General  Indicate any recent Medical Services you may have received from other than [facility] providers in the past year (date may be approximate).     Assessment:   This is a routine wellness examination for [place].  Hearing/Vision screen No results found.   Goals Addressed   None    Depression Screen    08/27/2022    1:20 PM 07/23/2022    9:35 AM 06/21/2022    9:02 AM 06/16/2021   12:02 PM 06/15/2020   12:03 PM 04/22/2019   10:40 AM 04/15/2018   10:38 AM  PHQ 2/9 Scores  PHQ - 2 Score 0 0 0 0 0 0 0  PHQ- 9 Score  2   0      Fall Risk    08/16/2022   11:50 AM 07/23/2022    9:35 AM 06/21/2022    9:04 AM 07/05/2021   11:13 AM 07/05/2021   11:10 AM  Fall Risk   Falls in the past year? 0 0 0 0 0  Number falls in past yr: 0  0    Injury with Fall? 0  0    Risk for fall due to : No Fall Risks  Impaired vision    Follow up Falls evaluation completed  Falls prevention discussed      MEDICARE RISK AT HOME:    TIMED UP AND GO:  Was the test performed?  {AMBTIMEDUPGO:[id number]}    Cognitive Function:    06/15/2020   12:04 PM  MMSE - Mini Mental State Exam  Orientation to time 5   Orientation to Place 5  Registration 3  Attention/ Calculation 5  Recall 3  Language- repeat 1        06/21/2022    9:05 AM  6CIT Screen  What Year? 0 points  What month? 0 points  What time? 0 points  Count back from 20 0 points  Months in reverse 0 points  Repeat phrase 0 points  Total Score 0 points    Immunizations Immunization History  Administered Date(s) Administered   Fluad Quad(high Dose 65+) 03/12/2019, 04/10/2020, 07/19/2021   Fluad Trivalent(High Dose 65+) 04/16/2023   Influenza Whole 05/19/2009   Influenza, High Dose Seasonal PF 06/04/2017, 03/21/2018, 04/08/2022   Influenza, Seasonal, Injecte, Preservative Fre 06/20/2012   Influenza,inj,Quad PF,6+ Mos 03/08/2015, 04/18/2016   Influenza,inj,quad, With Preservative 03/19/2019, 04/06/2020   Influenza-Unspecified 04/14/2013   Moderna Covid-19 Fall Seasonal Vaccine 40yrs & older 04/08/2022   PFIZER(Purple Top)SARS-COV-2 Vaccination 09/17/2019, 10/17/2019   PNEUMOCOCCAL CONJUGATE-20 07/19/2021   Pneumococcal Polysaccharide-23 04/22/2019   Td 07/07/2002   Tdap 06/20/2012, 05/27/2019    {TDAP status:2101805}  {Flu Vaccine status:2101806}  {Pneumococcal vaccine status:2101807}  {Covid-19 vaccine status:2101808}  Qualifies for Shingles Vaccine? {YES/NO:21197}  Zostavax completed {YES/NO:21197}  {Shingrix Completed?:2101804}  Screening Tests Health Maintenance  Topic Date Due   Zoster Vaccines- Shingrix (1 of 2) Never done   COVID-19 Vaccine (4 -  2024-25 season) 02/17/2023   Medicare Annual Wellness (AWV)  06/22/2023   Colonoscopy  07/28/2026   DTaP/Tdap/Td (4 - Td or Tdap) 05/26/2029   Pneumonia Vaccine 64+ Years old  Completed   INFLUENZA VACCINE  Completed   Hepatitis C Screening  Completed   HPV VACCINES  Aged Out    Health Maintenance  Health Maintenance Due  Topic Date Due   Zoster Vaccines- Shingrix (1 of 2) Never done   COVID-19 Vaccine (4 - 2024-25 season) 02/17/2023   Medicare Annual  Wellness (AWV)  06/22/2023    {Colorectal cancer screening:2101809}  Lung Cancer Screening: (Low Dose CT Chest recommended if Age 91-80 years, 20 pack-year currently smoking OR have quit w/in 15years.) {DOES NOT does:27190::does not} qualify.   Lung Cancer Screening Referral: ***  Additional Screening:  Hepatitis C Screening: {DOES NOT does:27190::does not} qualify; Completed ***  Vision Screening: Recommended annual ophthalmology exams for early detection of glaucoma and other disorders of the eye. Is the patient up to date with their annual eye exam?  {YES/NO:21197} Who is the provider or what is the name of the office in which the patient attends annual eye exams? *** If pt is not established with a provider, would they like to be referred to a provider to establish care? {YES/NO:21197}.   Dental Screening: Recommended annual dental exams for proper oral hygiene  Diabetic Foot Exam: {Diabetic Foot Exam:2101802}  Community Resource Referral / Chronic Care Management: CRR required this visit?  {YES/NO:21197}  CCM required this visit?  {CCM Required choices:[id number]}     Plan:     I have personally reviewed and noted the following in the patient's chart:   Medical and social history Use of alcohol, tobacco or illicit drugs  Current medications and supplements including opioid prescriptions. {Opioid Prescriptions:[phone]} Functional ability and status Nutritional status Physical activity Advanced directives List of other physicians Hospitalizations, surgeries, and ER visits in previous 12 months Vitals Screenings to include cognitive, depression, and falls Referrals and appointments  In addition, I have reviewed and discussed with patient certain preventive protocols, quality metrics, and best practice recommendations. A written personalized care plan for preventive services as well as general preventive health recommendations were provided to patient.      Erminio LITTIE Saris, LPN   01/21/7973   After Visit Summary: {CHL AMB AWV After Visit Summary:[phone]}  Nurse Notes: ***

## 2023-06-21 NOTE — Telephone Encounter (Signed)
 Copied from CRM [id number]. Topic: General - Other >> Jun 21, 2023  1:26 PM Fredrich Romans wrote: Reason for CRM: patient returned call to [facility] nurse health advisor he would like a return call to reschedule his awv telphone clal that he missed at 11am

## 2023-06-24 ENCOUNTER — Other Ambulatory Visit: Payer: Self-pay | Admitting: Family Medicine

## 2023-06-26 NOTE — Telephone Encounter (Signed)
 Rx prescribed 07/2022 for acute illness.   Request denied.

## 2023-07-01 ENCOUNTER — Telehealth: Payer: Self-pay | Admitting: Family Medicine

## 2023-07-01 NOTE — Telephone Encounter (Signed)
 Spoke with pt notifying him it would be better that he provide urine sample at tomorrow OV an initial test will be run at that time. Pt verbalizes understanding and expresses his thanks for the call.

## 2023-07-01 NOTE — Telephone Encounter (Signed)
 Copied from CRM [id number]. Topic: Clinical - Request for Lab/Test Order >> Jul 01, 2023  8:55 AM Corean SAUNDERS wrote: Reason for CRM: Patient has a 2 PM appointment with Dr. Rilla tomorrow 1/14 for urinary troubles and is requesting an order to be sent so that he can drop off his urinary sample today in order to discuss results with Dr. Rilla tomorrow.

## 2023-07-02 ENCOUNTER — Ambulatory Visit (INDEPENDENT_AMBULATORY_CARE_PROVIDER_SITE_OTHER): Payer: Medicare HMO | Admitting: Family Medicine

## 2023-07-02 ENCOUNTER — Encounter: Payer: Self-pay | Admitting: Family Medicine

## 2023-07-02 VITALS — BP 118/80 | HR 73 | Temp 97.8°F | Ht 71.5 in | Wt 211.4 lb

## 2023-07-02 DIAGNOSIS — N419 Inflammatory disease of prostate, unspecified: Secondary | ICD-10-CM | POA: Insufficient documentation

## 2023-07-02 DIAGNOSIS — R300 Dysuria: Secondary | ICD-10-CM | POA: Diagnosis not present

## 2023-07-02 DIAGNOSIS — N410 Acute prostatitis: Secondary | ICD-10-CM | POA: Diagnosis not present

## 2023-07-02 LAB — POC URINALSYSI DIPSTICK (AUTOMATED)
Bilirubin, UA: NEGATIVE
Blood, UA: NEGATIVE
Glucose, UA: NEGATIVE
Ketones, UA: NEGATIVE
Leukocytes, UA: NEGATIVE
Nitrite, UA: NEGATIVE
Protein, UA: NEGATIVE
Spec Grav, UA: >=1.03 — AB (ref 1.010–1.025)
Urobilinogen, UA: 0.2 U/dL
pH, UA: 6 (ref 5.0–8.0)

## 2023-07-02 MED ORDER — SULFAMETHOXAZOLE-TRIMETHOPRIM 800-160 MG PO TABS: 1.0000 | ORAL_TABLET | Freq: Two times a day (BID) | ORAL | 0 refills | Status: DC

## 2023-07-02 NOTE — Progress Notes (Signed)
 Ph: [phone] Fax: [phone]   Patient ID: Carlin JINNY Lime, male    DOB: 09-27-1950, 73 y.o.   MRN: [id number]  This visit was conducted in person.  BP 118/80   Pulse 73   Temp 97.8 F (36.6 C) (Oral)   Ht 5' 11.5 (1.816 m)   Wt 211 lb 6 oz (95.9 kg)   SpO2 94%   BMI 29.07 kg/m    CC: urinary symptoms  Subjective:   HPI: MOSS BERRY is a 73 y.o. male presenting on 07/02/2023 for Dysuria (C/o burning when urinating, trouble getting stream started, weak stream and foul odor. Sxs started 06/29/23.)   3d h/o incomplete bladder emptying, frequency, urgency, dysuria, increased nocturia, foul smell, and worsening weak stream.  No hematuria, fevers/chills, nausea, lower abd pain, significant flank discomfort. No recent antibiotic use. No low back or rectal pain or pressure.   Has had prostatitis, last was years ago.   He was daily flomax  0.4mg , started 03/2023 for BPH symptoms (weakening stream, hesitancy, dribbling and am frequency). Only took for a couple days due to limited benefit.  Lab Results  Component Value Date   PSA 0.14 07/17/2022   PSA 0.21 06/09/2021   PSA 0.20 06/15/2020   Takes sildenafil  20mg  5 tablets about twice weekly.      Relevant past medical, surgical, family and social history reviewed and updated as indicated. Interim medical history since our last visit reviewed. Allergies and medications reviewed and updated. Outpatient Medications Prior to Visit  Medication Sig Dispense Refill   acetaminophen  (TYLENOL ) 325 MG tablet Take 650 mg by mouth every 6 (six) hours as needed for moderate pain.      cholestyramine  (QUESTRAN ) 4 g packet Take 1 packet (4 g total) by mouth 2 (two) times daily. 180 each 4   clobetasol  ointment (TEMOVATE ) 0.05 % Apply to aa's rash QD-BID PRN. Avoid applying to face, groin, and axilla. Use as directed. Long-term use can cause thinning of the skin. 45 g 1   esomeprazole  (NEXIUM ) 40 MG capsule Take 1 capsule (40 mg total)  by mouth 2 (two) times daily before a meal. (Patient taking differently: Take 40 mg by mouth 2 (two) times daily before a meal. Takes once a day) 180 capsule 1   Misc Natural Products (OSTEO BI-FLEX JOINT SHIELD PO) Take by mouth daily.     POTASSIUM PO Take by mouth daily.     sildenafil  (REVATIO ) 20 MG tablet TAKE 3 TO 5 TABLETS BY MOUTH EVERY DAY AS NEEDED 60 tablet 11   cetirizine  (ZYRTEC ) 10 MG tablet Take 1 tablet (10 mg total) by mouth daily. 30 tablet 0   famotidine (PEPCID) 20 MG tablet Take 20 mg by mouth in the morning and at bedtime.     tamsulosin  (FLOMAX ) 0.4 MG CAPS capsule Take 1 capsule (0.4 mg total) by mouth daily. (Patient not taking: Reported on 07/02/2023) 90 capsule 3   No facility-administered medications prior to visit.     Per HPI unless specifically indicated in ROS section below Review of Systems  Objective:  BP 118/80   Pulse 73   Temp 97.8 F (36.6 C) (Oral)   Ht 5' 11.5 (1.816 m)   Wt 211 lb 6 oz (95.9 kg)   SpO2 94%   BMI 29.07 kg/m   Wt Readings from Last 3 Encounters:  07/02/23 211 lb 6 oz (95.9 kg)  04/16/23 213 lb (96.6 kg)  10/03/22 210 lb (95.3 kg)  Physical Exam Vitals and nursing note reviewed.  Constitutional:      Appearance: Normal appearance. He is not ill-appearing.  HENT:     Mouth/Throat:     Mouth: Mucous membranes are moist.     Pharynx: Oropharynx is clear. No oropharyngeal exudate or posterior oropharyngeal erythema.  Eyes:     Extraocular Movements: Extraocular movements intact.     Pupils: Pupils are equal, round, and reactive to light.  Abdominal:     General: Bowel sounds are normal. There is no distension.     Palpations: Abdomen is soft. There is no mass.     Tenderness: There is no abdominal tenderness. There is no right CVA tenderness, left CVA tenderness, guarding or rebound. Negative signs include Murphy's sign.     Hernia: No hernia is present.  Genitourinary:    Prostate: Tender (diffuse tenderness). Not  enlarged.     Rectum: Normal. No mass, tenderness, anal fissure, external hemorrhoid or internal hemorrhoid. Normal anal tone.     Comments: Boggy prostate on DRE.  Musculoskeletal:     Right lower leg: No edema.     Left lower leg: No edema.  Neurological:     Mental Status: He is alert.  Psychiatric:        Mood and Affect: Mood normal.        Behavior: Behavior normal.       Results for orders placed or performed in visit on 07/02/23  POCT Urinalysis Dipstick (Automated)   Collection Time: 07/02/23  2:19 PM  Result Value Ref Range   Color, UA yellow    Clarity, UA clear    Glucose, UA Negative Negative   Bilirubin, UA negative    Ketones, UA negative    Spec Grav, UA >=1.030 (A) 1.010 - 1.025   Blood, UA negative    pH, UA 6.0 5.0 - 8.0   Protein, UA Negative Negative   Urobilinogen, UA 0.2 0.2 or 1.0 E.U./dL   Nitrite, UA negative    Leukocytes, UA Negative Negative   Lab Results  Component Value Date   NA 139 07/17/2022   CL 101 07/17/2022   K 5.0 07/17/2022   CO2 29 07/17/2022   BUN 16 07/17/2022   CREATININE 0.99 07/17/2022   GFR 77.33 07/17/2022   CALCIUM 9.3 07/17/2022   ALBUMIN 4.2 07/17/2022   GLUCOSE 98 07/17/2022   Assessment & Plan:   Problem List Items Addressed This Visit     Prostatitis - Primary   LUTS with normal UA. Prior BPH - no significant improvement with flomax .  Symptoms are improving.  DRE today - boggy tender prostate suspicious for acute prostatitis - will start bactrim  DS 14d course. Will also send UCx. Update if not improving with treatment.       Other Visit Diagnoses       Dysuria       Relevant Orders   POCT Urinalysis Dipstick (Automated) (Completed)   Urine Culture        Meds ordered this encounter  Medications   sulfamethoxazole -trimethoprim  (BACTRIM  DS) 800-160 MG tablet    Sig: Take 1 tablet by mouth 2 (two) times daily.    Dispense:  28 tablet    Refill:  0    Orders Placed This Encounter  Procedures    Urine Culture   POCT Urinalysis Dipstick (Automated)    Patient Instructions  Urine overall ok today, a little concentrated.  Suspect prostate infection. Take bactrim  ds twice daily for 2 weeks.  Push fluids and rest Continue limiting bladder irritants like caffeine, dark sodas, spicy foods  Follow up plan: No follow-ups on file.  Anton Blas, MD

## 2023-07-02 NOTE — Patient Instructions (Signed)
 Urine overall ok today, a little concentrated.  Suspect prostate infection. Take bactrim ds twice daily for 2 weeks.  Push fluids and rest Continue limiting bladder irritants like caffeine, dark sodas, spicy foods

## 2023-07-02 NOTE — Assessment & Plan Note (Addendum)
 LUTS with normal UA. Prior BPH - no significant improvement with flomax .  Symptoms are improving.  DRE today - boggy tender prostate suspicious for acute prostatitis - will start bactrim  DS 14d course. Will also send UCx. Update if not improving with treatment.

## 2023-07-03 LAB — URINE CULTURE
MICRO NUMBER:: 15952899
Result:: NO GROWTH
SPECIMEN QUALITY:: ADEQUATE

## 2023-07-21 ENCOUNTER — Other Ambulatory Visit: Payer: Self-pay | Admitting: Family Medicine

## 2023-07-21 DIAGNOSIS — E611 Iron deficiency: Secondary | ICD-10-CM

## 2023-07-21 DIAGNOSIS — E785 Hyperlipidemia, unspecified: Secondary | ICD-10-CM

## 2023-07-21 DIAGNOSIS — N400 Enlarged prostate without lower urinary tract symptoms: Secondary | ICD-10-CM

## 2023-07-21 DIAGNOSIS — R7303 Prediabetes: Secondary | ICD-10-CM

## 2023-07-23 ENCOUNTER — Ambulatory Visit: Payer: Medicare HMO

## 2023-07-23 VITALS — Ht 71.5 in | Wt 211.0 lb

## 2023-07-23 DIAGNOSIS — Z0000 Encounter for general adult medical examination without abnormal findings: Secondary | ICD-10-CM

## 2023-07-23 NOTE — Progress Notes (Signed)
 Subjective:   Mark Martinez is a 73 y.o. male who presents for Medicare Annual/Subsequent preventive examination.  Visit Complete: Virtual I connected with  Mark Martinez on 07/23/23 by a audio enabled telemedicine application and verified that I am speaking with the correct person using two identifiers.  Patient Location: Home  Provider Location: Office/Clinic  I discussed the limitations of evaluation and management by telemedicine. The patient expressed understanding and agreed to proceed.  Vital Signs: Because this visit was a virtual/telehealth visit, some criteria may be missing or patient reported. Any vitals not documented were not able to be obtained and vitals that have been documented are patient reported.  Patient Medicare AWV questionnaire was completed by the patient on (not done); I have confirmed that all information answered by patient is correct and no changes since this date.  Cardiac Risk Factors include: advanced age (>99men, >42 women);dyslipidemia;male gender    Objective:    Today's Vitals   07/23/23 0936  Weight: 211 lb (95.7 kg)  Height: 5' 11.5 (1.816 m)   Body mass index is 29.02 kg/m.     07/23/2023    9:46 AM 06/21/2022    9:04 AM 06/26/2021    9:31 AM 06/20/2021    9:44 AM 06/16/2021   11:59 AM 06/15/2020   12:01 PM 05/10/2020    8:00 PM  Advanced Directives  Does Patient Have a Medical Advance Directive? Yes Yes Yes  Yes Yes Yes  Type of Estate Agent of [place];Living will Healthcare Power of [place];Living will Living will Living will Healthcare Power of [place];Living will Healthcare Power of [place];Living will Living will  Does patient want to make changes to medical advance directive?     Yes (MAU/Ambulatory/Procedural Areas - Information given)  No - Patient declined  Copy of Healthcare Power of Attorney in Chart? No - copy requested No - copy requested  No - copy requested  No - copy requested     Current  Medications (verified) Outpatient Encounter Medications as of 07/23/2023  Medication Sig   acetaminophen  (TYLENOL ) 325 MG tablet Take 650 mg by mouth every 6 (six) hours as needed for moderate pain.    cholestyramine  (QUESTRAN ) 4 g packet Take 1 packet (4 g total) by mouth 2 (two) times daily.   clobetasol  ointment (TEMOVATE ) 0.05 % Apply to aa's rash QD-BID PRN. Avoid applying to face, groin, and axilla. Use as directed. Long-term use can cause thinning of the skin.   esomeprazole  (NEXIUM ) 40 MG capsule Take 1 capsule (40 mg total) by mouth 2 (two) times daily before a meal. (Patient taking differently: Take 40 mg by mouth 2 (two) times daily before a meal. Takes once a day)   Misc Natural Products (OSTEO BI-FLEX JOINT SHIELD PO) Take by mouth daily.   POTASSIUM PO Take by mouth daily.   sildenafil  (REVATIO ) 20 MG tablet TAKE 3 TO 5 TABLETS BY MOUTH EVERY DAY AS NEEDED   sulfamethoxazole -trimethoprim  (BACTRIM  DS) 800-160 MG tablet Take 1 tablet by mouth 2 (two) times daily. (Patient not taking: Reported on 07/23/2023)   No facility-administered encounter medications on file as of 07/23/2023.    Allergies (verified) Codeine    History: Past Medical History:  Diagnosis Date   Cataract    COVID-19 virus infection 06/2019   ED (erectile dysfunction)    Elevated LFTs    Esophageal stricture    GERD (gastroesophageal reflux disease)    with hiatal hernia   H/O hiatal hernia  History of prostatitis    HLD (hyperlipidemia)    Prediabetes 07/28/2015   Shortness of breath    exersion   Tubular adenoma of colon    Past Surgical History:  Procedure Laterality Date   BACK SURGERY  1994   lumbar area   CHOLECYSTECTOMY     COLONOSCOPY  2005   WNL, rpt 10 yrs   COLONOSCOPY  07/2019   1 TA, hemorrhoids, rpt 7 yrs Marianne)   CYST REMOVAL NECK Left 08/31/2013   Procedure: EXCISION EPIDERMAL INCLUSION CYST LEFT NECK;  Surgeon: Dann FORBES Hummer, MD   ESOPHAGEAL DILATION     three last    ESOPHAGOGASTRODUODENOSCOPY  12-27-1996   hiatal hernia (Dr. Jakie)   SHOULDER ARTHROSCOPY WITH ROTATOR CUFF REPAIR AND SUBACROMIAL DECOMPRESSION Right 01/2020   distal clavicle resection, RTC repair (Supple)   Family History  Problem Relation Age of Onset   Valvular heart disease Mother    Ulcers Father    CAD Father 46       multiple stents   CAD Paternal Uncle    Cancer Neg Hx    Diabetes Neg Hx    Stroke Neg Hx    Colon cancer Neg Hx    Esophageal cancer Neg Hx    Pancreatic cancer Neg Hx    Liver disease Neg Hx    Stomach cancer Neg Hx    Social History   Socioeconomic History   Marital status: Married    Spouse name: Not on file   Number of children: 3   Years of education: Not on file   Highest education level: Not on file  Occupational History   Occupation: seeding and Surveyor, Mining: OTHER  Tobacco Use   Smoking status: Never    Passive exposure: Never   Smokeless tobacco: Former    Quit date: 01/30/2009  Vaping Use   Vaping status: Never Used  Substance and Sexual Activity   Alcohol use: No   Drug use: No   Sexual activity: Not Currently  Other Topics Concern   Not on file  Social History Narrative   Caffeine: rare   Lives with wife, 1 dog.   Grown children   Occupation: Works in aeronautical engineer.   Activity: stays active at work   Diet: daily fruits/vegetables, some water   Social Drivers of Health   Financial Resource Strain: Low Risk  (07/23/2023)   Overall Financial Resource Strain (CARDIA)    Difficulty of Paying Living Expenses: Not hard at all  Food Insecurity: No Food Insecurity (07/23/2023)   Hunger Vital Sign    Worried About Running Out of Food in the Last Year: Never true    Ran Out of Food in the Last Year: Never true  Transportation Needs: No Transportation Needs (07/23/2023)   PRAPARE - Administrator, Civil Service (Medical): No    Lack of Transportation (Non-Medical): No  Physical Activity: Insufficiently  Active (07/23/2023)   Exercise Vital Sign    Days of Exercise per Week: 4 days    Minutes of Exercise per Session: 30 min  Stress: No Stress Concern Present (07/23/2023)   [employer] of Occupational Health - Occupational Stress Questionnaire    Feeling of Stress : Not at all  Social Connections: Socially Integrated (07/23/2023)   Social Connection and Isolation Panel [NHANES]    Frequency of Communication with Friends and Family: More than three times a week    Frequency of Social Gatherings with Friends and  Family: More than three times a week    Attends Religious Services: More than 4 times per year    Active Member of Clubs or Organizations: Yes    Attends Banker Meetings: 1 to 4 times per year    Marital Status: Married    Tobacco Counseling Counseling given: Not Answered   Clinical Intake:  Pre-visit preparation completed: Yes  Pain : No/denies pain     BMI - recorded: 29.02 Nutritional Status: BMI 25 -29 Overweight Nutritional Risks: None Diabetes: No  How often do you need to have someone help you when you read instructions, pamphlets, or other written materials from your doctor or pharmacy?: 1 - Never  Interpreter Needed?: No  Comments: lives with wife Information entered by :: B.Anberlin Diez,LPN   Activities of Daily Living    07/23/2023    9:47 AM  In your present state of health, do you have any difficulty performing the following activities:  Hearing? 1  Vision? 0  Difficulty concentrating or making decisions? 0  Walking or climbing stairs? 0  Dressing or bathing? 0  Doing errands, shopping? 0  Preparing Food and eating ? N  Using the Toilet? N  In the past six months, have you accidently leaked urine? N  Do you have problems with loss of bowel control? N  Managing your Medications? N  Managing your Finances? N  Housekeeping or managing your Housekeeping? N    Patient Care Team: Rilla Baller, MD as PCP - General  Indicate any  recent Medical Services you may have received from other than [facility] providers in the past year (date may be approximate).     Assessment:   This is a routine wellness examination for [place].  Hearing/Vision screen Hearing Screening - Comments:: Pt says he has ringing in ears; has problems when lots of ppl in room but otherwise is ok Vision Screening - Comments:: Pt says he only wears readers;vision is good Dr Belenda   Goals Addressed             This Visit's Progress    COMPLETED: Patient Stated   On track    06/15/2020, I will maintain and continue medications as prescribed.      Patient Stated   On track    07/23/23-Would like to maintain current routine     Patient Stated   On track    07/23/23-Stay active and healthy       Depression Screen    07/23/2023    9:44 AM 07/02/2023    2:04 PM 08/27/2022    1:20 PM 07/23/2022    9:35 AM 06/21/2022    9:02 AM 06/16/2021   12:02 PM 06/15/2020   12:03 PM  PHQ 2/9 Scores  PHQ - 2 Score 0 0 0 0 0 0 0  PHQ- 9 Score  2  2   0    Fall Risk    07/23/2023    9:39 AM 07/02/2023    2:04 PM 08/16/2022   11:50 AM 07/23/2022    9:35 AM 06/21/2022    9:04 AM  Fall Risk   Falls in the past year? 0 0 0 0 0  Number falls in past yr: 0  0  0  Injury with Fall? 0  0  0  Risk for fall due to : No Fall Risks  No Fall Risks  Impaired vision  Follow up Education provided;Falls prevention discussed  Falls evaluation completed  Falls prevention discussed  MEDICARE RISK AT HOME: Medicare Risk at Home Any stairs in or around the home?: Yes If so, are there any without handrails?: Yes Home free of loose throw rugs in walkways, pet beds, electrical cords, etc?: Yes Adequate lighting in your home to reduce risk of falls?: Yes Life alert?: No Use of a cane, walker or w/c?: No Grab bars in the bathroom?: No Shower chair or bench in shower?: No Elevated toilet seat or a handicapped toilet?: Yes  TIMED UP AND GO:  Was the test performed?  No     Cognitive Function:    06/15/2020   12:04 PM  MMSE - Mini Mental State Exam  Orientation to time 5  Orientation to Place 5  Registration 3  Attention/ Calculation 5  Recall 3  Language- repeat 1        07/23/2023    9:52 AM 06/21/2022    9:05 AM  6CIT Screen  What Year? 0 points 0 points  What month? 0 points 0 points  What time? 0 points 0 points  Count back from 20 0 points 0 points  Months in reverse 0 points 0 points  Repeat phrase 0 points 0 points  Total Score 0 points 0 points    Immunizations Immunization History  Administered Date(s) Administered   Fluad Quad(high Dose 65+) 03/12/2019, 04/10/2020, 07/19/2021   Fluad Trivalent(High Dose 65+) 04/16/2023   Influenza Whole 05/19/2009   Influenza, High Dose Seasonal PF 06/04/2017, 03/21/2018, 04/08/2022   Influenza, Seasonal, Injecte, Preservative Fre 06/20/2012   Influenza,inj,Quad PF,6+ Mos 03/08/2015, 04/18/2016   Influenza,inj,quad, With Preservative 03/19/2019, 04/06/2020   Influenza-Unspecified 04/14/2013   Moderna Covid-19 Fall Seasonal Vaccine 34yrs & older 04/08/2022   PFIZER(Purple Top)SARS-COV-2 Vaccination 09/17/2019, 10/17/2019   PNEUMOCOCCAL CONJUGATE-20 07/19/2021   Pneumococcal Polysaccharide-23 04/22/2019   Td 07/07/2002   Tdap 06/20/2012, 05/27/2019    TDAP status: Up to date  Flu Vaccine status: Up to date  Pneumococcal vaccine status: Up to date  Covid-19 vaccine status: Completed vaccines  Qualifies for Shingles Vaccine? Yes   Zostavax completed No   Shingrix Completed?: No.    Education has been provided regarding the importance of this vaccine. Patient has been advised to call insurance company to determine out of pocket expense if they have not yet received this vaccine. Advised may also receive vaccine at local pharmacy or Health Dept. Verbalized acceptance and understanding.  Screening Tests Health Maintenance  Topic Date Due   Zoster Vaccines- Shingrix (1 of 2) Never done    COVID-19 Vaccine (4 - 2024-25 season) 02/17/2023   Medicare Annual Wellness (AWV)  07/22/2024   Colonoscopy  07/28/2026   DTaP/Tdap/Td (4 - Td or Tdap) 05/26/2029   Pneumonia Vaccine 39+ Years old  Completed   INFLUENZA VACCINE  Completed   Hepatitis C Screening  Completed   HPV VACCINES  Aged Out    Health Maintenance  Health Maintenance Due  Topic Date Due   Zoster Vaccines- Shingrix (1 of 2) Never done   COVID-19 Vaccine (4 - 2024-25 season) 02/17/2023    Colorectal cancer screening: Type of screening: Colonoscopy. Completed 07/29/2019. Repeat every 8 years  Lung Cancer Screening: (Low Dose CT Chest recommended if Age 27-80 years, 20 pack-year currently smoking OR have quit w/in 15years.) does not qualify.   Lung Cancer Screening Referral: no  Additional Screening:  Hepatitis C Screening: does not qualify; Completed 09/09/2020  Vision Screening: Recommended annual ophthalmology exams for early detection of glaucoma and other disorders of the eye.  Is the patient up to date with their annual eye exam?  Yes  Who is the provider or what is the name of the office in which the patient attends annual eye exams? Dr Camillo If pt is not established with a provider, would they like to be referred to a provider to establish care? No .   Dental Screening: Recommended annual dental exams for proper oral hygiene  Diabetic Foot Exam: n/a  Community Resource Referral / Chronic Care Management: CRR required this visit?  No   CCM required this visit?  No     Plan:     I have personally reviewed and noted the following in the patient's chart:   Medical and social history Use of alcohol, tobacco or illicit drugs  Current medications and supplements including opioid prescriptions. Patient is not currently taking opioid prescriptions. Functional ability and status Nutritional status Physical activity Advanced directives List of other physicians Hospitalizations, surgeries, and ER  visits in previous 12 months Vitals Screenings to include cognitive, depression, and falls Referrals and appointments  In addition, I have reviewed and discussed with patient certain preventive protocols, quality metrics, and best practice recommendations. A written personalized care plan for preventive services as well as general preventive health recommendations were provided to patient.    Erminio LITTIE Saris, LPN   12/20/7972   After Visit Summary: (MyChart) Due to this being a telephonic visit, the after visit summary with patients personalized plan was offered to patient via MyChart   Nurse Notes: The patient states he is doing well. His only concern is difficulty urinating. He relays it improved some after ABT but still not as it should be.He has no questions at this time.

## 2023-07-23 NOTE — Patient Instructions (Signed)
Mr. Obar , Thank you for taking time to come for your Medicare Wellness Visit. I appreciate your ongoing commitment to your health goals. Please review the following plan we discussed and let me know if I can assist you in the future.   Referrals/Orders/Follow-Ups/Clinician Recommendations: none  This is a list of the screening recommended for you and due dates:  Health Maintenance  Topic Date Due   Zoster (Shingles) Vaccine (1 of 2) Never done   COVID-19 Vaccine (4 - 2024-25 season) 02/17/2023   Medicare Annual Wellness Visit  07/22/2024   Colon Cancer Screening  07/28/2026   DTaP/Tdap/Td vaccine (4 - Td or Tdap) 05/26/2029   Pneumonia Vaccine  Completed   Flu Shot  Completed   Hepatitis C Screening  Completed   HPV Vaccine  Aged Out    Advanced directives: (Copy Requested) Please bring a copy of your health care power of attorney and living will to the office to be added to your chart at your convenience.  Next Medicare Annual Wellness Visit scheduled for next year: Yes  07/24/23 @ 9:30am televisit

## 2023-07-24 ENCOUNTER — Other Ambulatory Visit (INDEPENDENT_AMBULATORY_CARE_PROVIDER_SITE_OTHER): Payer: Medicare HMO

## 2023-07-24 DIAGNOSIS — N400 Enlarged prostate without lower urinary tract symptoms: Secondary | ICD-10-CM

## 2023-07-24 DIAGNOSIS — E611 Iron deficiency: Secondary | ICD-10-CM

## 2023-07-24 DIAGNOSIS — R7303 Prediabetes: Secondary | ICD-10-CM | POA: Diagnosis not present

## 2023-07-24 DIAGNOSIS — E785 Hyperlipidemia, unspecified: Secondary | ICD-10-CM | POA: Diagnosis not present

## 2023-07-24 LAB — COMPREHENSIVE METABOLIC PANEL
ALT: 19 U/L (ref 0–53)
AST: 17 U/L (ref 0–37)
Albumin: 3.9 g/dL (ref 3.5–5.2)
Alkaline Phosphatase: 96 U/L (ref 39–117)
BUN: 17 mg/dL (ref 6–23)
CO2: 29 meq/L (ref 19–32)
Calcium: 8.8 mg/dL (ref 8.4–10.5)
Chloride: 101 meq/L (ref 96–112)
Creatinine, Ser: 1.06 mg/dL (ref 0.40–1.50)
GFR: 70.73 mL/min (ref >60.00)
Glucose, Bld: 93 mg/dL (ref 70–99)
Potassium: 4.6 meq/L (ref 3.5–5.1)
Sodium: 139 meq/L (ref 135–145)
Total Bilirubin: 0.5 mg/dL (ref 0.2–1.2)
Total Protein: 6.4 g/dL (ref 6.0–8.3)

## 2023-07-24 LAB — CBC WITH DIFFERENTIAL/PLATELET
Basophils Absolute: 0 10*3/uL (ref 0.0–0.1)
Basophils Relative: 0.4 % (ref 0.0–3.0)
Eosinophils Absolute: 0.2 10*3/uL (ref 0.0–0.7)
Eosinophils Relative: 3.2 % (ref 0.0–5.0)
HCT: 49.4 % (ref 39.0–52.0)
Hemoglobin: 16.4 g/dL (ref 13.0–17.0)
Lymphocytes Relative: 38.4 % (ref 12.0–46.0)
Lymphs Abs: 2.2 10*3/uL (ref 0.7–4.0)
MCHC: 33.1 g/dL (ref 30.0–36.0)
MCV: 93.4 fL (ref 78.0–100.0)
Monocytes Absolute: 0.4 10*3/uL (ref 0.1–1.0)
Monocytes Relative: 6.9 % (ref 3.0–12.0)
Neutro Abs: 2.9 10*3/uL (ref 1.4–7.7)
Neutrophils Relative %: 51.1 % (ref 43.0–77.0)
Platelets: 235 10*3/uL (ref 150.0–400.0)
RBC: 5.29 Mil/uL (ref 4.22–5.81)
RDW: 13.7 % (ref 11.5–15.5)
WBC: 5.7 10*3/uL (ref 4.0–10.5)

## 2023-07-24 LAB — LIPID PANEL
Cholesterol: 169 mg/dL (ref 0–200)
HDL: 46.8 mg/dL (ref >39.00)
LDL Cholesterol: 87 mg/dL (ref 0–99)
NonHDL: 122.6
Total CHOL/HDL Ratio: 4
Triglycerides: 176 mg/dL — ABNORMAL HIGH (ref 0.0–149.0)
VLDL: 35.2 mg/dL (ref 0.0–40.0)

## 2023-07-24 LAB — HEMOGLOBIN A1C: Hgb A1c MFr Bld: 5.9 % (ref 4.6–6.5)

## 2023-07-24 LAB — FERRITIN: Ferritin: 41.4 ng/mL (ref 22.0–322.0)

## 2023-07-24 LAB — PSA: PSA: 0.23 ng/mL (ref 0.10–4.00)

## 2023-07-24 LAB — IBC PANEL
Iron: 87 ug/dL (ref 42–165)
Saturation Ratios: 20.6 % (ref 20.0–50.0)
TIBC: 422.8 ug/dL (ref 250.0–450.0)
Transferrin: 302 mg/dL (ref 212.0–360.0)

## 2023-07-31 ENCOUNTER — Ambulatory Visit (INDEPENDENT_AMBULATORY_CARE_PROVIDER_SITE_OTHER): Payer: Medicare HMO | Admitting: Family Medicine

## 2023-07-31 ENCOUNTER — Encounter: Payer: Self-pay | Admitting: Family Medicine

## 2023-07-31 VITALS — BP 132/84 | HR 81 | Temp 97.8°F | Ht 71.0 in | Wt 210.0 lb

## 2023-07-31 DIAGNOSIS — K2100 Gastro-esophageal reflux disease with esophagitis, without bleeding: Secondary | ICD-10-CM

## 2023-07-31 DIAGNOSIS — Z7189 Other specified counseling: Secondary | ICD-10-CM | POA: Diagnosis not present

## 2023-07-31 DIAGNOSIS — N401 Enlarged prostate with lower urinary tract symptoms: Secondary | ICD-10-CM

## 2023-07-31 DIAGNOSIS — N529 Male erectile dysfunction, unspecified: Secondary | ICD-10-CM

## 2023-07-31 DIAGNOSIS — Z0000 Encounter for general adult medical examination without abnormal findings: Secondary | ICD-10-CM

## 2023-07-31 DIAGNOSIS — R7303 Prediabetes: Secondary | ICD-10-CM

## 2023-07-31 DIAGNOSIS — E611 Iron deficiency: Secondary | ICD-10-CM

## 2023-07-31 DIAGNOSIS — R3912 Poor urinary stream: Secondary | ICD-10-CM | POA: Diagnosis not present

## 2023-07-31 DIAGNOSIS — E785 Hyperlipidemia, unspecified: Secondary | ICD-10-CM

## 2023-07-31 MED ORDER — TADALAFIL 5 MG PO TABS: 5.0000 mg | ORAL_TABLET | Freq: Every day | ORAL | 11 refills | Status: DC

## 2023-07-31 MED ORDER — CHOLESTYRAMINE 4 G PO PACK: 4.0000 g | PACK | Freq: Two times a day (BID) | ORAL | 4 refills | Status: AC

## 2023-07-31 NOTE — Assessment & Plan Note (Signed)
Iron levels have normalized.

## 2023-07-31 NOTE — Progress Notes (Signed)
Ph: [phone] Fax: [phone]   Patient ID: Mark Martinez, male    DOB: 1950-10-08, 73 y.o.   MRN: [id number]  This visit was conducted in person.  BP 132/84   Pulse 81   Temp 97.8 F (36.6 C) (Oral)   Ht 5\' 11"  (1.803 m)   Wt 210 lb (95.3 kg)   SpO2 97%   BMI 29.29 kg/m    CC: CPE Subjective:   HPI: Mark Martinez is a 73 y.o. male presenting on 07/31/2023 for Annual Exam (MCR prt 2 [AWV- 07/23/23].)   Saw health advisor last week for medicare wellness visit. Note reviewed.  Completed antibiotic but notes ongoing urinary issues - see below.   No results found.  Flowsheet Row Clinical Support from 07/23/2023 in [facility]  PHQ-2 Total Score 0          07/23/2023    9:39 AM 07/02/2023    2:04 PM 08/16/2022   11:50 AM 07/23/2022    9:35 AM 06/21/2022    9:04 AM  Fall Risk   Falls in the past year? 0 0 0 0 0  Number falls in past yr: 0  0  0  Injury with Fall? 0  0  0  Risk for fall due to : No Fall Risks  No Fall Risks  Impaired vision  Follow up Education provided;Falls prevention discussed  Falls evaluation completed  Falls prevention discussed     Seen 06/2023 with dx acute prostatitis treated with 2 wk bactrim course.  Limited benefit while on flomax but he did restart this.  No more dysuria, nausea, abd pain, flank pain, fevers.  Ongoing weak stream, hesitancy/dribbling.  No significant nocturia x1.   Preventative: COLONOSCOPY 07/2019 - 1 TA, hemorrhoids, rpt 7 yrs Christella Hartigan) Prostate - yearly PSA. see above re BPH. No fmhx prostate cancer.  AAA screen - not eligible  Lung cancer screen - not eligible  Flu shot yearly COVID vaccine Pfizer 09/2019, 10/2019, booster 03/2022 Tdap 2014, 05/2019 Pneumovax 04/2019, prevnar-20 07/2021 Shingrix - discussed  Advanced directive discussion - has this at home. Wife would be HCPOA. Asked to bring [place] a copy. Ok with CPR and compression and intubation but no prolonged life support.   Seatbelt use discussed.  Sunscreen use discussed. No changing moles on skin. Saw derm - good report  Non smoker  Alcohol - none Dentist q6 mo  Eye exam yearly  Bowel - no constipation - diarrhea controled with cholestyramine  Bladder - no incontinence   Caffeine: rare   Lives with wife, 1 dog.   Grown children   Occupation: Works in Aeronautical engineer  Activity: stays active at work also has total gym at home Diet: daily fruits/vegetables, some water, decreased sweets      Relevant past medical, surgical, family and social history reviewed and updated as indicated. Interim medical history since our last visit reviewed. Allergies and medications reviewed and updated. Outpatient Medications Prior to Visit  Medication Sig Dispense Refill   acetaminophen (TYLENOL) 325 MG tablet Take 650 mg by mouth every 6 (six) hours as needed for moderate pain.      clobetasol ointment (TEMOVATE) 0.05 % Apply to aa's rash QD-BID PRN. Avoid applying to face, groin, and axilla. Use as directed. Long-term use can cause thinning of the skin. 45 g 1   esomeprazole (NEXIUM) 40 MG capsule Take 1 capsule (40 mg total) by mouth 2 (two) times daily before a meal. (Patient  taking differently: Take 40 mg by mouth 2 (two) times daily before a meal. Takes once a day) 180 capsule 1   Misc Natural Products (OSTEO BI-FLEX JOINT SHIELD PO) Take by mouth daily.     POTASSIUM PO Take by mouth daily.     cholestyramine (QUESTRAN) 4 g packet Take 1 packet (4 g total) by mouth 2 (two) times daily. 180 each 4   sildenafil (REVATIO) 20 MG tablet TAKE 3 TO 5 TABLETS BY MOUTH EVERY DAY AS NEEDED 60 tablet 11   sulfamethoxazole-trimethoprim (BACTRIM DS) 800-160 MG tablet Take 1 tablet by mouth 2 (two) times daily. (Patient not taking: Reported on 07/23/2023) 28 tablet 0   No facility-administered medications prior to visit.     Per HPI unless specifically indicated in ROS section below Review of Systems  Constitutional:  Negative for  activity change, appetite change, chills, fatigue, fever and unexpected weight change.  HENT:  Positive for congestion (URTI several weeks ago). Negative for hearing loss.   Eyes:  Negative for visual disturbance.  Respiratory:  Negative for cough, chest tightness, shortness of breath and wheezing.   Cardiovascular:  Negative for chest pain, palpitations and leg swelling.  Gastrointestinal:  Negative for abdominal distention, abdominal pain, blood in stool, constipation, diarrhea, nausea and vomiting.  Genitourinary:  Negative for difficulty urinating and hematuria.  Musculoskeletal:  Negative for arthralgias, myalgias and neck pain.  Skin:  Negative for rash.  Neurological:  Negative for dizziness, seizures, syncope and headaches.  Hematological:  Negative for adenopathy. Bruises/bleeds easily (mild).  Psychiatric/Behavioral:  Negative for dysphoric mood. The patient is not nervous/anxious.     Objective:  BP 132/84   Pulse 81   Temp 97.8 F (36.6 C) (Oral)   Ht 5\' 11"  (1.803 m)   Wt 210 lb (95.3 kg)   SpO2 97%   BMI 29.29 kg/m   Wt Readings from Last 3 Encounters:  07/31/23 210 lb (95.3 kg)  07/23/23 211 lb (95.7 kg)  07/02/23 211 lb 6 oz (95.9 kg)      Physical Exam Vitals and nursing note reviewed.  Constitutional:      General: He is not in acute distress.    Appearance: Normal appearance. He is well-developed. He is not ill-appearing.  HENT:     Head: Normocephalic and atraumatic.     Right Ear: Hearing, tympanic membrane, ear canal and external ear normal.     Left Ear: Hearing, tympanic membrane, ear canal and external ear normal.     Mouth/Throat:     Mouth: Mucous membranes are moist.     Pharynx: Oropharynx is clear. No oropharyngeal exudate or posterior oropharyngeal erythema.  Eyes:     General: No scleral icterus.    Extraocular Movements: Extraocular movements intact.     Conjunctiva/sclera: Conjunctivae normal.     Pupils: Pupils are equal, round, and  reactive to light.  Neck:     Thyroid: No thyroid mass or thyromegaly.     Vascular: No carotid bruit.  Cardiovascular:     Rate and Rhythm: Normal rate and regular rhythm.     Pulses: Normal pulses.          Radial pulses are 2+ on the right side and 2+ on the left side.     Heart sounds: Normal heart sounds. No murmur heard. Pulmonary:     Effort: Pulmonary effort is normal. No respiratory distress.     Breath sounds: Normal breath sounds. No wheezing, rhonchi or rales.  Abdominal:  General: Bowel sounds are normal. There is no distension.     Palpations: Abdomen is soft. There is no mass.     Tenderness: There is no abdominal tenderness. There is no guarding or rebound.     Hernia: No hernia is present.  Musculoskeletal:        General: Normal range of motion.     Cervical back: Normal range of motion and neck supple.     Right lower leg: No edema.     Left lower leg: No edema.  Lymphadenopathy:     Cervical: No cervical adenopathy.  Skin:    General: Skin is warm and dry.     Findings: No rash.  Neurological:     General: No focal deficit present.     Mental Status: He is alert and oriented to person, place, and time.  Psychiatric:        Mood and Affect: Mood normal.        Behavior: Behavior normal.        Thought Content: Thought content normal.        Judgment: Judgment normal.       Results for orders placed or performed in visit on 07/24/23  PSA   Collection Time: 07/24/23  7:36 AM  Result Value Ref Range   PSA 0.23 0.10 - 4.00 ng/mL  CBC with Differential/Platelet   Collection Time: 07/24/23  7:36 AM  Result Value Ref Range   WBC 5.7 4.0 - 10.5 K/uL   RBC 5.29 4.22 - 5.81 Mil/uL   Hemoglobin 16.4 13.0 - 17.0 g/dL   HCT 02.7 25.3 - 66.4 %   MCV 93.4 78.0 - 100.0 fl   MCHC 33.1 30.0 - 36.0 g/dL   RDW 40.3 47.4 - 25.9 %   Platelets 235.0 150.0 - 400.0 K/uL   Neutrophils Relative % 51.1 43.0 - 77.0 %   Lymphocytes Relative 38.4 12.0 - 46.0 %    Monocytes Relative 6.9 3.0 - 12.0 %   Eosinophils Relative 3.2 0.0 - 5.0 %   Basophils Relative 0.4 0.0 - 3.0 %   Neutro Abs 2.9 1.4 - 7.7 K/uL   Lymphs Abs 2.2 0.7 - 4.0 K/uL   Monocytes Absolute 0.4 0.1 - 1.0 K/uL   Eosinophils Absolute 0.2 0.0 - 0.7 K/uL   Basophils Absolute 0.0 0.0 - 0.1 K/uL  IBC panel   Collection Time: 07/24/23  7:36 AM  Result Value Ref Range   Iron 87 42 - 165 ug/dL   Transferrin 563.8 756.4 - 360.0 mg/dL   Saturation Ratios 33.2 20.0 - 50.0 %   TIBC 422.8 250.0 - 450.0 mcg/dL  Ferritin   Collection Time: 07/24/23  7:36 AM  Result Value Ref Range   Ferritin 41.4 22.0 - 322.0 ng/mL  Hemoglobin A1c   Collection Time: 07/24/23  7:36 AM  Result Value Ref Range   Hgb A1c MFr Bld 5.9 4.6 - 6.5 %  Comprehensive metabolic panel   Collection Time: 07/24/23  7:36 AM  Result Value Ref Range   Sodium 139 135 - 145 mEq/L   Potassium 4.6 3.5 - 5.1 mEq/L   Chloride 101 96 - 112 mEq/L   CO2 29 19 - 32 mEq/L   Glucose, Bld 93 70 - 99 mg/dL   BUN 17 6 - 23 mg/dL   Creatinine, Ser 9.51 0.40 - 1.50 mg/dL   Total Bilirubin 0.5 0.2 - 1.2 mg/dL   Alkaline Phosphatase 96 39 - 117 U/L   AST 17  0 - 37 U/L   ALT 19 0 - 53 U/L   Total Protein 6.4 6.0 - 8.3 g/dL   Albumin 3.9 3.5 - 5.2 g/dL   GFR 28.41 >32.44 mL/min   Calcium 8.8 8.4 - 10.5 mg/dL  Lipid panel   Collection Time: 07/24/23  7:36 AM  Result Value Ref Range   Cholesterol 169 0 - 200 mg/dL   Triglycerides 010.2 (H) 0.0 - 149.0 mg/dL   HDL 72.53 >66.44 mg/dL   VLDL 03.4 0.0 - 74.2 mg/dL   LDL Cholesterol 87 0 - 99 mg/dL   Total CHOL/HDL Ratio 4    NonHDL 122.60    Assessment & Plan:   Problem List Items Addressed This Visit     Healthcare maintenance - Primary (Chronic)   Preventative protocols reviewed and updated unless pt declined. Discussed healthy diet and lifestyle.       Advanced directives, counseling/discussion (Chronic)   Advanced directive discussion - has this at home. Wife would be  HCPOA. Asked to bring [place] a copy. Ok with CPR and compression and intubation but no prolonged life support.       Erectile dysfunction   Has been treating with generic sildenafil 60mg  with benefit.  Will switch to cialis 5mg  daily to see if benefit with chronic BPH as per below.      GERD (gastroesophageal reflux disease)   Chronic stable period on once daily nexium 40mg  [facility]       HLD (hyperlipidemia)   Chronic, stable period on cholestyramine. Reviewed diet choices to improve triglycerides. The 10-year ASCVD risk score (Arnett DK, et al., 2019) is: 19.3%   Values used to calculate the score:     Age: 76 years     Sex: Male     Is Non-Hispanic African American: No     Diabetic: No     Tobacco smoker: No     Systolic Blood Pressure: 132 mmHg     Is BP treated: No     HDL Cholesterol: 46.8 mg/dL     Total Cholesterol: 169 mg/dL       Relevant Medications   tadalafil (CIALIS) 5 MG tablet   cholestyramine (QUESTRAN) 4 g packet   Prediabetes   Reviewed limiting added sugar in diet       RESOLVED: Iron deficiency   Iron levels have normalized.       Benign enlargement of prostate   I-PSS = 23-4 - severe Flomax limited effectiveness.  Has been managing ED with sildenafil. Will stop both and start cialis 5mg  daily. Update with effect in 2-3 wks, consider starting finasteride.  If cialis ineffective, would trial flomax + finasteride.  If poor response to all medical treatments will refer to urology to discuss surgical management.         Meds ordered this encounter  Medications   tadalafil (CIALIS) 5 MG tablet    Sig: Take 1 tablet (5 mg total) by mouth daily.    Dispense:  30 tablet    Refill:  11   cholestyramine (QUESTRAN) 4 g packet    Sig: Take 1 packet (4 g total) by mouth 2 (two) times daily.    Dispense:  180 each    Refill:  4    No orders of the defined types were placed in this encounter.   Patient Instructions  If interested, check with  pharmacy about new 2 shot shingles series (shingrix).  Stop flomax and sildenafil. Start cialis 5mg  daily for  ED and prostate symptoms.  Let me know how this helps - if needed we can start daily finasteride that can help shrink the prostate over time.  Or if interested we could refer you to urology for surgical evaluation.  Bring [place] copy of your living will Could try L-theanine 200mg  supplement OTC as needed for night time awakenings Good to see you today  Return in 1 year for next physical/wellness visit   Follow up plan: Return in about 1 year (around 07/30/2024) for annual exam, prior fasting for blood work, medicare wellness visit.  Eustaquio Boyden, MD

## 2023-07-31 NOTE — Assessment & Plan Note (Signed)
Advanced directive discussion - has this at home. Wife would be HCPOA. Asked to bring [place] a copy. Ok with CPR and compression and intubation but no prolonged life support.

## 2023-07-31 NOTE — Assessment & Plan Note (Signed)
Chronic, stable period on cholestyramine. Reviewed diet choices to improve triglycerides. The 10-year ASCVD risk score (Arnett DK, et al., 2019) is: 19.3%   Values used to calculate the score:     Age: 73 years     Sex: Male     Is Non-Hispanic African American: No     Diabetic: No     Tobacco smoker: No     Systolic Blood Pressure: 132 mmHg     Is BP treated: No     HDL Cholesterol: 46.8 mg/dL     Total Cholesterol: 169 mg/dL

## 2023-07-31 NOTE — Assessment & Plan Note (Signed)
Has been treating with generic sildenafil 60mg  with benefit.  Will switch to cialis 5mg  daily to see if benefit with chronic BPH as per below.

## 2023-07-31 NOTE — Assessment & Plan Note (Signed)
Reviewed limiting added sugar in diet.

## 2023-07-31 NOTE — Assessment & Plan Note (Signed)
Preventative protocols reviewed and updated unless pt declined. Discussed healthy diet and lifestyle.

## 2023-07-31 NOTE — Assessment & Plan Note (Signed)
Chronic stable period on once daily nexium 40mg  [facility]

## 2023-07-31 NOTE — Patient Instructions (Addendum)
If interested, check with pharmacy about new 2 shot shingles series (shingrix).  Stop flomax and sildenafil. Start cialis 5mg  daily for ED and prostate symptoms.  Let me know how this helps - if needed we can start daily finasteride that can help shrink the prostate over time.  Or if interested we could refer you to urology for surgical evaluation.  Bring [place] copy of your living will Could try L-theanine 200mg  supplement OTC as needed for night time awakenings Good to see you today  Return in 1 year for next physical/wellness visit

## 2023-07-31 NOTE — Assessment & Plan Note (Signed)
I-PSS = 23-4 - severe Flomax limited effectiveness.  Has been managing ED with sildenafil. Will stop both and start cialis 5mg  daily. Update with effect in 2-3 wks, consider starting finasteride.  If cialis ineffective, would trial flomax + finasteride.  If poor response to all medical treatments will refer to urology to discuss surgical management.

## 2023-08-02 ENCOUNTER — Other Ambulatory Visit (HOSPITAL_COMMUNITY): Payer: Self-pay

## 2023-08-02 ENCOUNTER — Telehealth: Payer: Self-pay

## 2023-08-02 DIAGNOSIS — N401 Enlarged prostate with lower urinary tract symptoms: Secondary | ICD-10-CM

## 2023-08-02 NOTE — Telephone Encounter (Signed)
Pharmacy Patient Advocate Encounter   Received notification from  [facility] Portal that prior authorization for Tadalafil 5MG  tablets is required/requested.   Insurance verification completed.   The patient is insured through [employer] .   Per test claim: PA required; PA submitted to above mentioned insurance via CoverMyMeds Key/confirmation #/EOC [id number] Status is pending

## 2023-08-05 NOTE — Telephone Encounter (Signed)
Pharmacy Patient Advocate Encounter  Received notification from [facility] that Prior Authorization for Tadalafil 5MG  tablets  has been DENIED.  Full denial letter will be uploaded to the media tab. See denial reason below.   PA #/Case ID/Reference #: [id number]

## 2023-08-05 NOTE — Telephone Encounter (Signed)
 Fyi to Dr. Reece Agar

## 2023-08-28 DIAGNOSIS — M19021 Primary osteoarthritis, right elbow: Secondary | ICD-10-CM | POA: Diagnosis not present

## 2023-08-28 DIAGNOSIS — M19022 Primary osteoarthritis, left elbow: Secondary | ICD-10-CM | POA: Diagnosis not present

## 2023-09-11 NOTE — Telephone Encounter (Addendum)
 Can we resubmit Rx for tadalafil 5mg  daily?  Indication is BPH, not just ED.  Tried and failed flomax.  If again denied, we will need to circle back with patient on other options.

## 2023-09-16 ENCOUNTER — Other Ambulatory Visit (HOSPITAL_COMMUNITY): Payer: Self-pay

## 2023-09-17 ENCOUNTER — Other Ambulatory Visit (HOSPITAL_COMMUNITY): Payer: Self-pay

## 2023-09-18 ENCOUNTER — Telehealth: Payer: Self-pay | Admitting: Pharmacist

## 2023-09-18 ENCOUNTER — Other Ambulatory Visit (HOSPITAL_COMMUNITY): Payer: Self-pay

## 2023-09-18 NOTE — Telephone Encounter (Signed)
 Appeal has been submitted for tadalafil. Will advise when response is received, please be advised that most companies may take 30 days to make a decision. Appeal letter and supporting documentation were faxed to [phone] on 09/18/2023 @5 :10 pm.  Thank you, Dellie Burns, PharmD Clinical Pharmacist  [facility]  Direct Dial: [phone]

## 2023-09-24 NOTE — Telephone Encounter (Addendum)
 Plz notify pt tadalafil 5mg  daily was denied - he must first try finasteride medicine for benign enlargement of prostate. To let me know if he'd like to try this. This is a slow acting medicine that shrinks size of prostate over time.   Lab Results  Component Value Date   PSA 0.23 07/24/2023   PSA 0.14 07/17/2022   PSA 0.21 06/09/2021

## 2023-09-24 NOTE — Telephone Encounter (Signed)
 Spoke with pt relaying Dr Timoteo Expose message. Pt states he had already stopped tadalafil due to it not helping with ED and declines to try finasteride. States he's gone back to taking tamsulosin 0.4 mg cap and sildenafil 20 mg tab, which he's aware his insurance doesn't cover. But he still prefers these 2 meds.

## 2023-09-24 NOTE — Telephone Encounter (Signed)
 Pharmacy Patient Advocate Encounter  Received notification from [facility] that an appeal for Tadalafil 5mg  tabs has been DENIED.  Full denial letter will be uploaded to the media tab. See denial reason below.   Reference #: [id number]

## 2023-09-25 MED ORDER — TAMSULOSIN HCL 0.4 MG PO CAPS: 0.4000 mg | ORAL_CAPSULE | Freq: Every day | ORAL | 3 refills | Status: AC

## 2023-09-25 MED ORDER — SILDENAFIL CITRATE 20 MG PO TABS
40.0000 mg | ORAL_TABLET | Freq: Every day | ORAL | 3 refills | Status: DC | PRN
Start: 2023-09-25 — End: 2023-11-06

## 2023-09-25 NOTE — Telephone Encounter (Signed)
 Left message on vm, per dpr, notifying pt Dr Reece Agar sent rxs for both sildenafil and tamsulosin (Flomax) to the pharmacy.

## 2023-09-25 NOTE — Telephone Encounter (Addendum)
 Noted. I've refilled these medications.

## 2023-09-25 NOTE — Addendum Note (Signed)
 Addended by: Eustaquio Boyden on: 09/25/2023 07:58 AM   Modules accepted: Orders

## 2023-10-11 DIAGNOSIS — H17821 Peripheral opacity of cornea, right eye: Secondary | ICD-10-CM | POA: Diagnosis not present

## 2023-10-11 DIAGNOSIS — H5201 Hypermetropia, right eye: Secondary | ICD-10-CM | POA: Diagnosis not present

## 2023-10-11 DIAGNOSIS — H524 Presbyopia: Secondary | ICD-10-CM | POA: Diagnosis not present

## 2023-10-11 DIAGNOSIS — H52223 Regular astigmatism, bilateral: Secondary | ICD-10-CM | POA: Diagnosis not present

## 2023-10-11 DIAGNOSIS — H2513 Age-related nuclear cataract, bilateral: Secondary | ICD-10-CM | POA: Diagnosis not present

## 2023-10-11 DIAGNOSIS — H02831 Dermatochalasis of right upper eyelid: Secondary | ICD-10-CM | POA: Diagnosis not present

## 2023-10-11 DIAGNOSIS — H02834 Dermatochalasis of left upper eyelid: Secondary | ICD-10-CM | POA: Diagnosis not present

## 2023-11-05 ENCOUNTER — Telehealth: Payer: Self-pay

## 2023-11-05 ENCOUNTER — Other Ambulatory Visit (HOSPITAL_COMMUNITY): Payer: Self-pay

## 2023-11-05 NOTE — Telephone Encounter (Signed)
 Pharmacy Patient Advocate Encounter  Received notification from [employer] that Prior Authorization for Sildenafil  Citrate (PAH) 20MG  tabletshas been DENIED.  Full denial letter will be uploaded to the media tab. See denial reason below.   PA #/Case ID/Reference #: [id number]

## 2023-11-05 NOTE — Telephone Encounter (Signed)
 Pharmacy Patient Advocate Encounter   Received notification from CoverMyMeds that prior authorization for Sildenafil  Citrate (PAH) 20MG  tablets is required/requested.   Insurance verification completed.   The patient is insured through [employer] .   Per test claim: PA required; PA submitted to above mentioned insurance via CoverMyMeds Key/confirmation #/EOC [facility] Status is pending

## 2023-11-06 ENCOUNTER — Other Ambulatory Visit: Payer: Self-pay

## 2023-11-06 DIAGNOSIS — N529 Male erectile dysfunction, unspecified: Secondary | ICD-10-CM

## 2023-11-06 NOTE — Addendum Note (Signed)
 Addended by: Sabrea Sankey on: 11/06/2023 12:53 PM   Modules accepted: Orders

## 2023-11-06 NOTE — Telephone Encounter (Signed)
 Copied from CRM [id number]. Topic: Clinical - Prescription Issue >> Nov 06, 2023 11:54 AM Albertha Alosa wrote: Reason for CRM: Patient called in stated prescription was sent to the wrong pharmacy ,   sildenafil  (REVATIO ) 20 MG tablet - would rather have [address]   [id number] [place], [place] - [address] AT [facility] OF [address] [place] [address] Phone: [phone] Fax: [phone]

## 2023-11-07 IMAGING — MR MR ABDOMEN WO/W CM
18 series · 48 of 48 positions shown · IV contrast (9ml Gadavist)
Comparison: Multiple priors including CT September 07, 2021, MRI
June 06, 2021

CLINICAL DATA: Follow-up post gallbladder surgery, history of
hepatic abscess.

EXAM:
MRI ABDOMEN WITHOUT AND WITH CONTRAST
TECHNIQUE: Multiplanar multisequence MR imaging of the abdomen was performed
both before and after the administration of intravenous contrast.
CONTRAST:  9mL GADAVIST GADOBUTROL 1 MMOL/ML IV SOLN

[Series 3: T2 · coronal · 6.0mm · 1.19mm/px · 2 of 30 slices shown (1 of 2)]
[im 1/30]
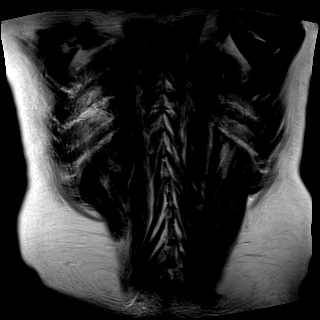
[im 30/30]
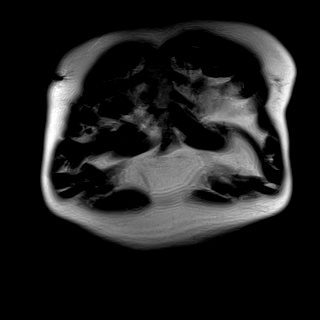

[Series 4: T2 · axial · 6.0mm · 1.19mm/px · z∈[-128,+95]mm · 2 of 32 slices shown (2 of 2)]
[im 1/32]
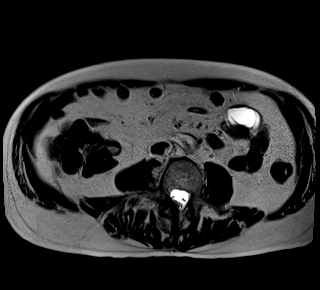
[im 32/32]
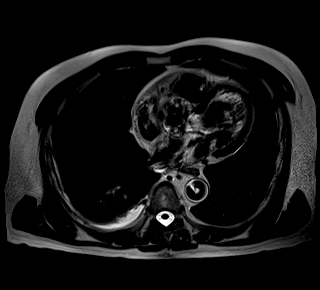

[Series 6: T2 fat-sat · axial · 6.0mm · 1.19mm/px · z∈[-129,+109]mm · 2 of 34 slices shown]
[im 1/34]
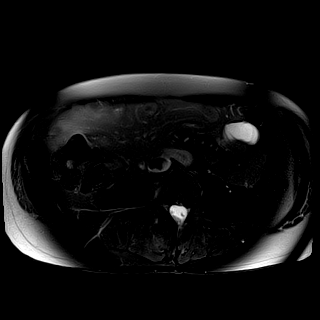
[im 34/34]
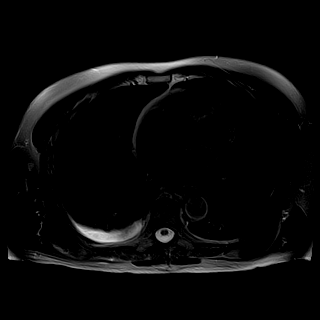

[Series 7: ax dwi_tracew · axial · 6.0mm · 1.42mm/px · z∈[-129,+109]mm · 4 of 102 slices shown]
[im 1/102]
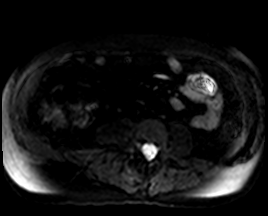
[im 34/102]
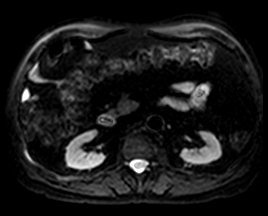
[im 68/102]
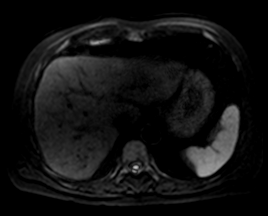
[im 102/102]
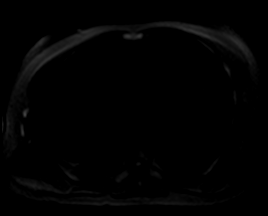

[Series 8: ax dwi_adc · axial · 6.0mm · 1.42mm/px · 1 of 34 slices shown]
[im 1/34]
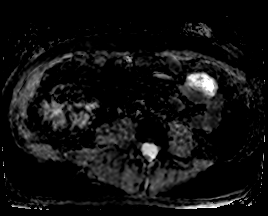

[Series 9: in & out · axial · 3.0mm · 1.19mm/px · z∈[-135,+102]mm · 3 of 80 slices shown (1 of 2)]
[im 1/80]
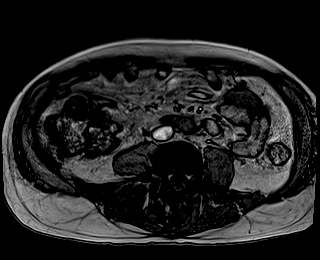
[im 40/80]
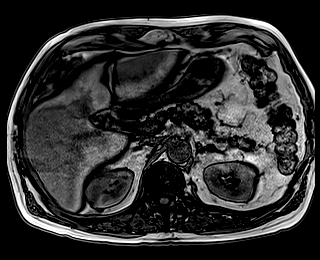
[im 80/80]
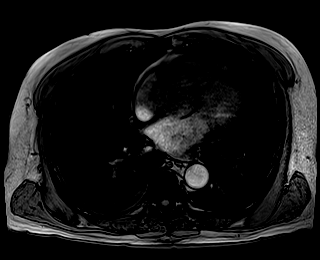

[Series 10: in & out · axial · 3.0mm · 1.19mm/px · z∈[-135,+102]mm · 3 of 80 slices shown (2 of 2)]
[im 1/80]
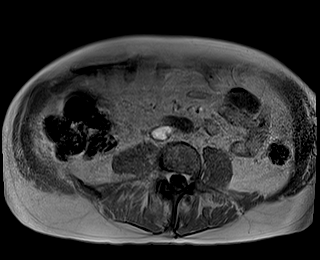
[im 40/80]
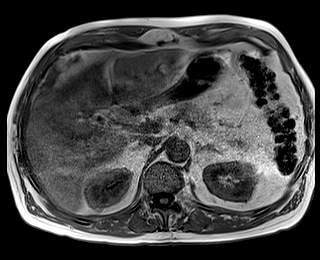
[im 80/80]
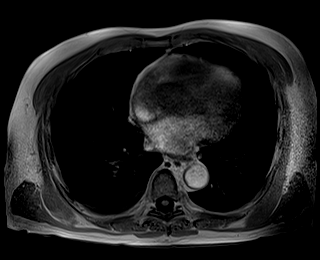

[Series 11: bSSFP · axial · 6.0mm · 0.74mm/px · 1 of 32 slices shown]
[im 1/32]
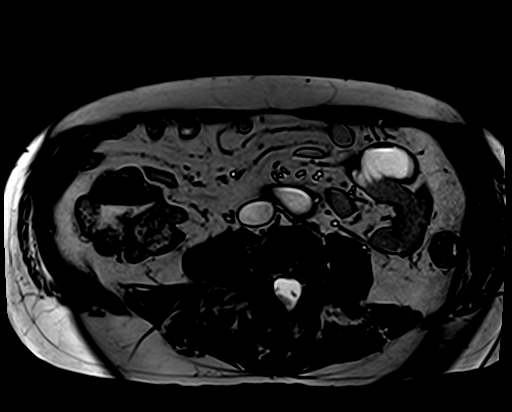

[Series 12: T1 dynamic fat-sat · axial · non-contrast · 3.0mm · 1.19mm/px · z∈[-135,+102]mm · 3 of 80 slices shown (1 of 5)]
[im 1/80]
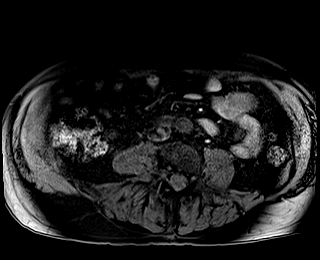
[im 40/80]
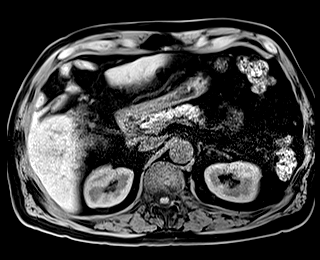
[im 80/80]
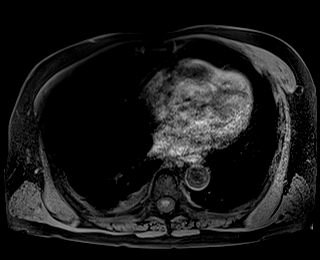

[Series 13: T1 dynamic fat-sat post-contrast · axial · 3.0mm · 1.19mm/px · z∈[-135,+102]mm · 3 of 80 slices shown (1 of 4)]
[im 1/80]
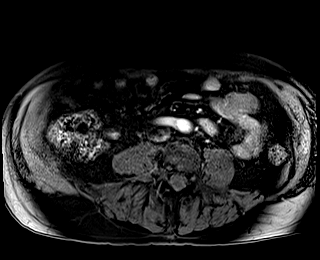
[im 40/80]
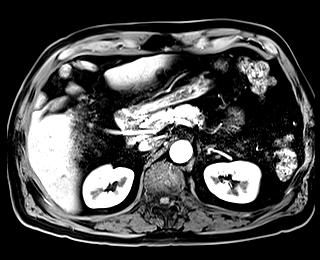
[im 80/80]
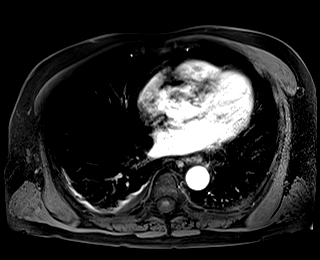

[Series 14: T1 dynamic fat-sat · axial · 3.0mm · 1.19mm/px · z∈[-135,+102]mm · 3 of 80 slices shown (2 of 5)]
[im 1/80]
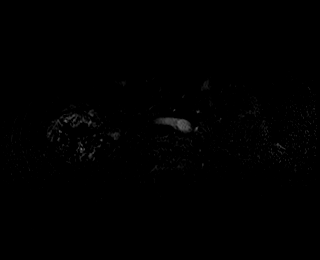
[im 40/80]
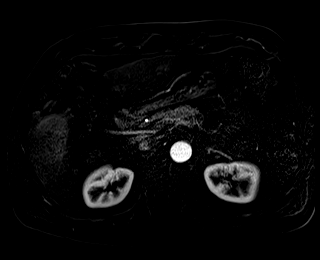
[im 80/80]
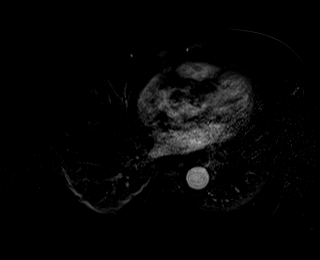

[Series 15: T1 dynamic fat-sat post-contrast · axial · 3.0mm · 1.19mm/px · z∈[-135,+102]mm · 3 of 80 slices shown (2 of 4)]
[im 1/80]
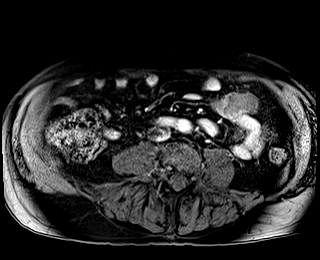
[im 40/80]
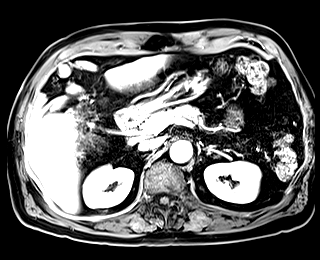
[im 80/80]
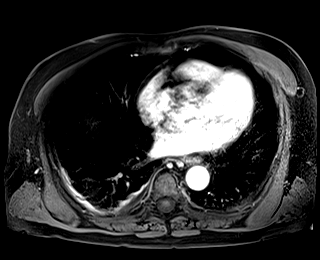

[Series 16: T1 dynamic fat-sat · axial · 3.0mm · 1.19mm/px · z∈[-135,+102]mm · 3 of 80 slices shown (3 of 5)]
[im 1/80]
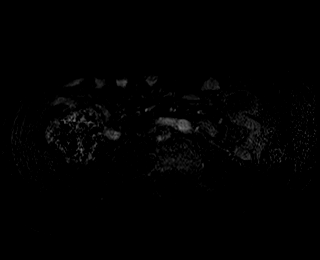
[im 40/80]
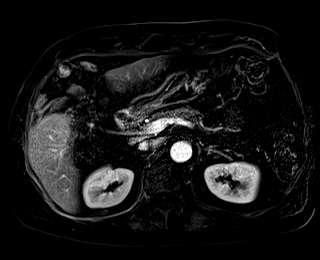
[im 80/80]
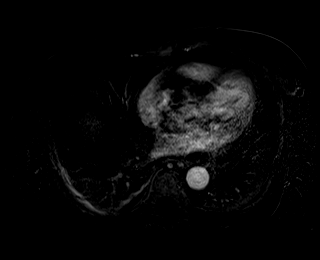

[Series 17: T1 dynamic fat-sat post-contrast · axial · 3.0mm · 1.19mm/px · z∈[-135,+102]mm · 3 of 80 slices shown (3 of 4)]
[im 1/80]
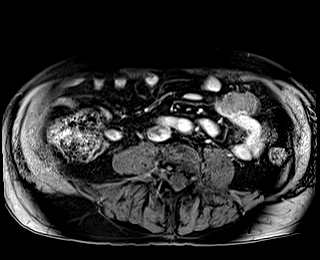
[im 40/80]
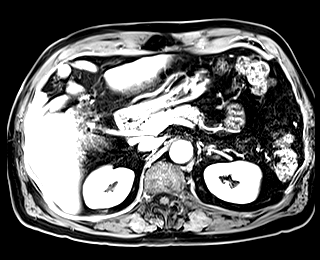
[im 80/80]
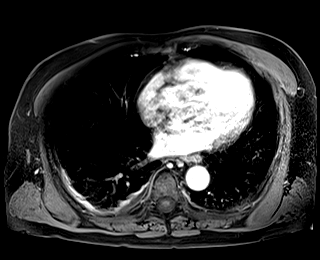

[Series 18: T1 dynamic fat-sat · axial · 3.0mm · 1.19mm/px · z∈[-135,+102]mm · 3 of 80 slices shown (4 of 5)]
[im 1/80]
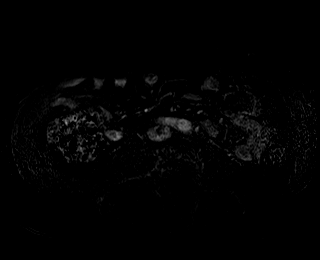
[im 40/80]
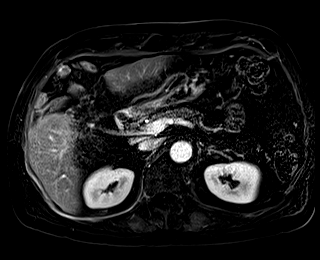
[im 80/80]
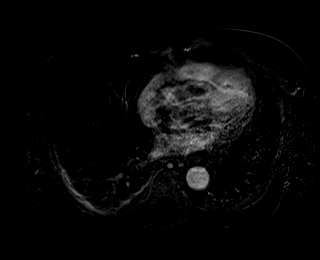

[Series 19: T1 dynamic post-contrast · coronal · 3.0mm · 1.31mm/px · 3 of 72 slices shown]
[im 1/72]
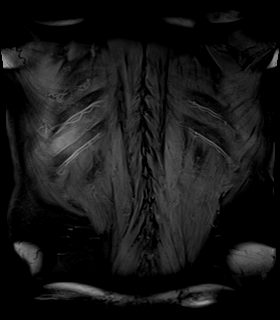
[im 36/72]
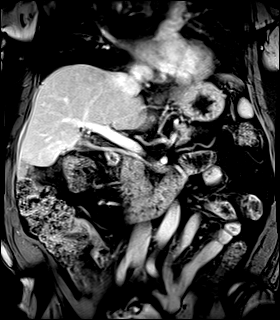
[im 72/72]
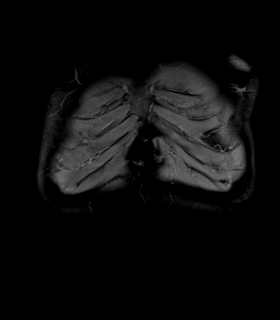

[Series 20: T1 dynamic fat-sat post-contrast · axial · 3.0mm · 1.19mm/px · z∈[-135,+102]mm · 3 of 80 slices shown (4 of 4)]
[im 1/80]
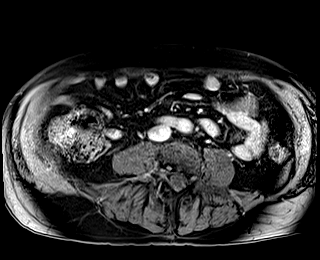
[im 40/80]
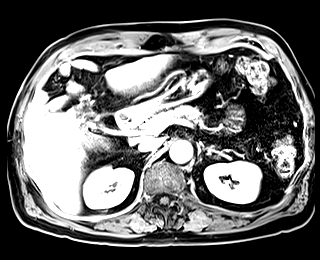
[im 80/80]
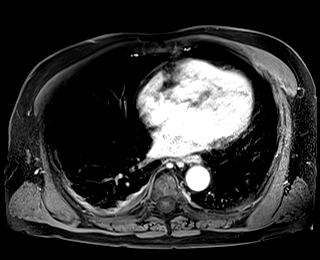

[Series 21: T1 dynamic fat-sat · axial · 3.0mm · 1.19mm/px · z∈[-135,+102]mm · 3 of 80 slices shown (5 of 5)]
[im 1/80]
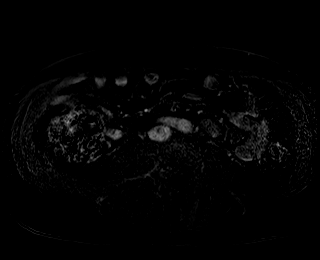
[im 40/80]
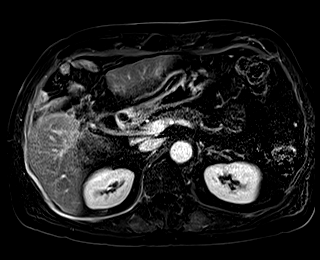
[im 80/80]
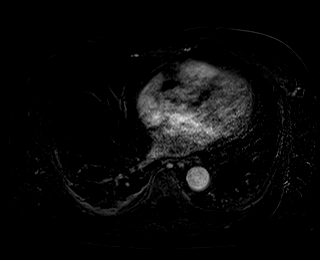

[48 of 48 positions shown; findings below may reference images not displayed]

FINDINGS: Lower chest: Small right pleural effusion with adjacent atelectasis
similar prior.

Hepatobiliary: No significant hepatic steatosis.

Gallbladder is surgically absent.

Fluid collection in the gallbladder fossa which demonstrates
enhancing minimally thickened walls now measures 21 x 16 mm on image
[DATE]. There is significant reduction with near complete resolution
of the branching irregular channels extending from this area seen on
previous study.

MRI

Interval decreased size of the peripherally enhancing subcapsular
lesion in the posterior right hepatic lobe which now measures 9 x 4
mm on image 19/20 previously measuring 12 x 8 mm, which given the
appearance on prior imaging and comparison today is most consistent
with a resolving intrahepatic abscess.

Stable small nonenhancing hepatic cysts for instance on image [DATE].

Pancreas: No pancreatic ductal dilation or evidence of acute
inflammation.

Spleen:  No splenomegaly or focal splenic lesion.

Adrenals/Urinary Tract: Bilateral adrenal glands appear normal. No
hydronephrosis. Small bilateral T2 hyperintense renal lesions
measure up to 3-4 mm, not this demonstrating postcontrast
enhancement consistent with renal cysts which in the absence of
clinically indicated signs/symptoms require no independent
follow-up.

Stomach/Bowel: Tiny hiatal hernia. There is a small fluid collection
likely subserosal, along the anterior aspect of the gastric antrum
on image [DATE] and 40/20 which demonstrates a tract to the
inflammatory collection in the gallbladder fossa for instance on
coronal haste image [DATE]. Moderate volume of formed stool throughout
the colon suggestive of constipation.

Vascular/Lymphatic: The portal, splenic and superior mesenteric
veins are patent. Normal caliber abdominal aorta. No pathologically
enlarged abdominal or pelvic lymph nodes.

Other:  Trace perihepatic free fluid.

Musculoskeletal: Levoconvex curvature of the lumbar spine with
associated degenerative change.
IMPRESSION: 1. Fluid collection in the gallbladder fossa demonstrates minimally
thickened enhancing walls with reduced diffusivity and measuring 21
x 16 mm, suspicious for residual/recurrent abscess.
2. Significant reduction/near complete resolution of the branching
irregular channels extending from this area seen on previous study.
3. Interval decreased size of the peripherally enhancing subcapsular
lesion in the posterior right hepatic lobe which now measures 9 x 4
mm likely reflecting a resolving intrahepatic abscess.
4. Small fluid collection, along the anterior aspect of the gastric
antrum which demonstrates a tract to the inflammatory collection in
the gallbladder fossa is suspicious for a small intramural abscess
in the gastric antrum. Consider further evaluation with upper
endoscopy if clinically indicated.
5. Small right pleural effusion with adjacent atelectasis similar
prior.
6. Tiny hiatal hernia.

## 2023-11-08 MED ORDER — SILDENAFIL CITRATE 20 MG PO TABS: 40.0000 mg | ORAL_TABLET | Freq: Every day | ORAL | 1 refills | Status: DC | PRN

## 2023-11-08 NOTE — Telephone Encounter (Signed)
 Plz notify this was sent to [facility] pharmacy.

## 2023-11-08 NOTE — Telephone Encounter (Signed)
 Spoke with pt notifying him rx was sent to [facility]. Pt expresses his thanks.

## 2024-01-15 ENCOUNTER — Other Ambulatory Visit: Payer: Self-pay | Admitting: Family Medicine

## 2024-01-15 DIAGNOSIS — N529 Male erectile dysfunction, unspecified: Secondary | ICD-10-CM

## 2024-01-15 NOTE — Telephone Encounter (Signed)
 Name of Medication: sildenafil  (REVATIO ) 20 MG tablet  Name of Pharmacy: [facility]  Last [place] or Written Date and Quantity: 11/08/23 #60 1 rf  Last Office Visit and Type: CPE 07/31/23 Next Office Visit and Type: Cpe 07/30/24

## 2024-01-16 ENCOUNTER — Other Ambulatory Visit: Payer: Self-pay | Admitting: Physician Assistant

## 2024-04-05 ENCOUNTER — Other Ambulatory Visit: Payer: Self-pay | Admitting: Family Medicine

## 2024-04-05 DIAGNOSIS — N529 Male erectile dysfunction, unspecified: Secondary | ICD-10-CM

## 2024-04-07 NOTE — Telephone Encounter (Signed)
 Name of Medication: sildenafil  (REVATIO ) 20 MG tablet  Name of Pharmacy: [facility]  Last [place] or Written Date and Quantity: 11/08/23 #60 1 rf  Last Office Visit and Type: CPE 07/31/23 Next Office Visit and Type: Cpe 07/30/24

## 2024-04-12 ENCOUNTER — Other Ambulatory Visit: Payer: Self-pay | Admitting: Physician Assistant

## 2024-04-28 ENCOUNTER — Ambulatory Visit: Payer: Medicare HMO | Admitting: Dermatology

## 2024-04-28 ENCOUNTER — Encounter: Payer: Self-pay | Admitting: Dermatology

## 2024-04-28 DIAGNOSIS — L578 Other skin changes due to chronic exposure to nonionizing radiation: Secondary | ICD-10-CM | POA: Diagnosis not present

## 2024-04-28 DIAGNOSIS — D1801 Hemangioma of skin and subcutaneous tissue: Secondary | ICD-10-CM | POA: Diagnosis not present

## 2024-04-28 DIAGNOSIS — D692 Other nonthrombocytopenic purpura: Secondary | ICD-10-CM | POA: Diagnosis not present

## 2024-04-28 DIAGNOSIS — D229 Melanocytic nevi, unspecified: Secondary | ICD-10-CM

## 2024-04-28 DIAGNOSIS — Z1283 Encounter for screening for malignant neoplasm of skin: Secondary | ICD-10-CM | POA: Diagnosis not present

## 2024-04-28 DIAGNOSIS — W908XXA Exposure to other nonionizing radiation, initial encounter: Secondary | ICD-10-CM

## 2024-04-28 DIAGNOSIS — L821 Other seborrheic keratosis: Secondary | ICD-10-CM

## 2024-04-28 DIAGNOSIS — Z872 Personal history of diseases of the skin and subcutaneous tissue: Secondary | ICD-10-CM | POA: Diagnosis not present

## 2024-04-28 DIAGNOSIS — L814 Other melanin hyperpigmentation: Secondary | ICD-10-CM

## 2024-04-28 DIAGNOSIS — B079 Viral wart, unspecified: Secondary | ICD-10-CM

## 2024-04-28 NOTE — Patient Instructions (Signed)

## 2024-04-28 NOTE — Progress Notes (Signed)
 Subjective   Mark Martinez is a 73 y.o. male who presents for the following: Total body skin exam for skin cancer screening and mole check. The patient has spots, moles and lesions to be evaluated, some may be new or changing and the patient may have concern these could be cancer. Patient is established patient .  Today patient reports: Patient with hx AKs, denies any new spots of concern. Patient states wart treated in January did resolve after cryotherapy.   Review of Systems:    No other skin or systemic complaints except as noted in HPI or Assessment and Plan.  The following portions of the chart were reviewed this encounter and updated as appropriate: medications, allergies, medical history  Relevant Medical History:  Personal history of actinic keratosis   Objective  (SKPE) Well appearing patient in no apparent distress; mood and affect are within normal limits. Examination was performed of the: Waist Up Skin Exam: scalp, head, eyes, ears, nose, lips, neck, chest, axillae, upper extremities, abdomen, back, hands, fingers, fingernails   Examination notable for: Angioma(s): Scattered red vascular papule(s)  , Lentigo/lentigines: Scattered pigmented macules that are tan to brown in color and are somewhat non-uniform in shape and concentrated in the sun-exposed areas, Nevus/nevi: Scattered well-demarcated, regular, pigmented macule(s) and/or papule(s)  , Seborrheic Keratosis(es): Stuck-on appearing keratotic papule(s) on the trunk, none  irritated with redness, crusting, edema, and/or partial avulsion, Actinic Damage/Elastosis: chronic sun damage: dyspigmentation, telangiectasia, and wrinkling, Actinic Purpura: Diffuse atrophy with scattered purpuric patches 1-3 cm in size on the bilateral forearms and dorsal hands. There is evidence of post-inflammatory hyperpigmentation with an orange-brown color change  Examination limited by: Undergarments, Shoes or socks , and Patient deferred removal        Assessment & Plan  (SKAP)   SKIN CANCER SCREENING PERFORMED TODAY.  BENIGN SKIN FINDINGS  - Lentigines  - Seborrheic keratoses  - Hemangiomas   - Nevus/Multiple Benign Nevi  - Reassurance provided regarding the benign appearance of lesions noted on exam today; no treatment is indicated in the absence of symptoms/changes. - Reinforced importance of photoprotective strategies including liberal and frequent sunscreen use of a broad-spectrum SPF 30 or greater, use of protective clothing, and sun avoidance for prevention of cutaneous malignancy and photoaging.  Counseled patient on the importance of regular self-skin monitoring as well as routine clinical skin examinations as scheduled.   ACTINIC DAMAGE - Chronic condition, secondary to cumulative UV/sun exposure - Recommend daily broad spectrum sunscreen SPF 30+ to sun-exposed areas, reapply every 2 hours as needed.  - Staying in the shade or wearing long sleeves, sun glasses (UVA+UVB protection) and wide brim hats (4-inch brim around the entire circumference of the hat) are also recommended for sun protection.  - Call for new or changing lesions.  Personal history of actinic keratosis  - Reviewed medical history for full details  - Reviewed sun protective measures as above - Encouraged full body skin exams     Verruca vulgaris at right lower eyelid  - Resolved.   Level of service outlined above   Patient instructions (SKPI)   Procedures, orders, diagnosis for this visit:  LENTIGINES   MULTIPLE BENIGN NEVI   SEBORRHEIC KERATOSES   ACTINIC ELASTOSIS   CHERRY ANGIOMA   SOLAR PURPURA    Lentigines  Multiple benign nevi  Seborrheic keratoses  Actinic elastosis  Cherry angioma  Solar purpura    Return to clinic: Return in about 1 year (around 04/28/2025) for UBSE, w/  Dr. Claudene.  I, Jacquelynn V. Wilfred, CMA, am acting as scribe for Boneta Claudene, MD.  Documentation: I have reviewed the above  documentation for accuracy and completeness, and I agree with the above.  Boneta Claudene, MD

## 2024-05-22 ENCOUNTER — Ambulatory Visit: Admitting: Family

## 2024-05-22 ENCOUNTER — Telehealth: Payer: Self-pay

## 2024-05-22 VITALS — BP 120/76 | HR 101 | Temp 98.0°F | Ht 71.0 in | Wt 213.2 lb

## 2024-05-22 DIAGNOSIS — R0602 Shortness of breath: Secondary | ICD-10-CM

## 2024-05-22 DIAGNOSIS — J029 Acute pharyngitis, unspecified: Secondary | ICD-10-CM

## 2024-05-22 DIAGNOSIS — J22 Unspecified acute lower respiratory infection: Secondary | ICD-10-CM

## 2024-05-22 DIAGNOSIS — Z20822 Contact with and (suspected) exposure to covid-19: Secondary | ICD-10-CM

## 2024-05-22 LAB — POC COVID19 BINAXNOW: SARS Coronavirus 2 Ag: NEGATIVE

## 2024-05-22 LAB — POCT RAPID STREP A (OFFICE): Rapid Strep A Screen: NEGATIVE

## 2024-05-22 MED ORDER — PREDNISONE 20 MG PO TABS: ORAL_TABLET | ORAL | 0 refills | Status: DC

## 2024-05-22 MED ORDER — ALBUTEROL SULFATE (2.5 MG/3ML) 0.083% IN NEBU: 2.5000 mg | INHALATION_SOLUTION | Freq: Four times a day (QID) | RESPIRATORY_TRACT | 1 refills | Status: AC | PRN

## 2024-05-22 MED ORDER — ALBUTEROL SULFATE HFA 108 (90 BASE) MCG/ACT IN AERS: 2.0000 | INHALATION_SPRAY | Freq: Four times a day (QID) | RESPIRATORY_TRACT | 0 refills | Status: AC | PRN

## 2024-05-22 NOTE — Addendum Note (Signed)
 Addended by: RILLA BALLER on: 05/22/2024 05:35 PM   Modules accepted: Orders

## 2024-05-22 NOTE — Telephone Encounter (Addendum)
 I sent in albuterol  inhaler. Wife notified - left voicemail.

## 2024-05-22 NOTE — Telephone Encounter (Signed)
 Copied from CRM [id number]. Topic: Clinical - Prescription Issue >> May 22, 2024  1:01 PM Viola F wrote: Reason for CRM: Patient spouse Orlean called regarding the nebulizer solution that was sent to the pharmacy today. Patient doesn't have a machine for the solution and would prefer if an inhaler could be sent to the pharmacy. Please call Orlean at [phone] [facility])

## 2024-05-22 NOTE — Progress Notes (Unsigned)
 Established Patient Office Visit  Subjective:      CC:  Chief Complaint  Patient presents with   Acute Visit    Reports sore throat, congestion, cough x3 days. Denies fever, chills, body aches.    HPI: Mark Martinez is a 73 y.o. male presenting on 05/22/2024 for Acute Visit (Reports sore throat, congestion, cough x3 days. Denies fever, chills, body aches.) .  Discussed the use of AI scribe software for clinical note transcription with the patient, who gave verbal consent to proceed.  History of Present Illness ARCH METHOT Mark Martinez is a 73 year old male who presents with cough, shortness of breath, and wheezing.  Symptoms began on Tuesday with a persistent cough that started producing non-clear sputum today. He also experiences wheezing and increased shortness of breath, particularly during ambulation, which resolves at rest. He has no history of asthma or COPD.  He has had a fever of 99.8F and a sore throat since yesterday. He feels congested and has had a headache for three days, describing it as feeling like his head is 'filled'.  He has been taking over-the-counter medications including Mucinex  DM, Nyquil, and DayQuil, but they have not provided significant relief. He has a history of colds progressing to bronchitis, though previous episodes were minor and did not develop into bronchitis.  He denies any history of smoking and has no known allergies to medications such as prednisone  or penicillin. He has received pneumonia vaccinations in the past. He reports a somewhat decreased appetite but denies any heart conditions.  During the review of symptoms, he confirms experiencing periods of chest tightness, though it was less tight this morning. No chest pain or tenderness when coughing. He reports that his throat is sometimes painful when swallowing. He has experienced eye pain at times.         Social history:  Relevant past medical, surgical, family and social  history reviewed and updated as indicated. Interim medical history since our last visit reviewed.  Allergies and medications reviewed and updated.  DATA REVIEWED: CHART IN EPIC     ROS: Negative unless specifically indicated above in HPI.    Current Outpatient Medications:    acetaminophen  (TYLENOL ) 325 MG tablet, Take 650 mg by mouth every 6 (six) hours as needed for moderate pain. , Disp: , Rfl:    albuterol  (PROVENTIL ) (2.5 MG/3ML) 0.083% nebulizer solution, Take 3 mLs (2.5 mg total) by nebulization every 6 (six) hours as needed for wheezing or shortness of breath., Disp: 150 mL, Rfl: 1   cholestyramine  (QUESTRAN ) 4 g packet, Take 1 packet (4 g total) by mouth 2 (two) times daily., Disp: 180 each, Rfl: 4   clobetasol  ointment (TEMOVATE ) 0.05 %, Apply to aa's rash QD-BID PRN. Avoid applying to face, groin, and axilla. Use as directed. Long-term use can cause thinning of the skin., Disp: 45 g, Rfl: 1   esomeprazole  (NEXIUM ) 40 MG capsule, TAKE 1 CAPSULE (40 MG TOTAL) BY MOUTH 2 (TWO) TIMES DAILY BEFORE A MEAL., Disp: 180 capsule, Rfl: 0   Misc Natural Products (OSTEO BI-FLEX JOINT SHIELD PO), Take by mouth daily., Disp: , Rfl:    POTASSIUM PO, Take by mouth daily., Disp: , Rfl:    predniSONE  (DELTASONE ) 20 MG tablet, Take two tablets once daily for five days, Disp: 10 tablet, Rfl: 0   sildenafil  (REVATIO ) 20 MG tablet, TAKE 3 TO 5 TABLETS BY MOUTH EVERY DAY AS NEEDED, Disp: 60 tablet, Rfl: 1   tamsulosin  (FLOMAX ) 0.4  MG CAPS capsule, Take 1 capsule (0.4 mg total) by mouth daily., Disp: 90 capsule, Rfl: 3   albuterol  (VENTOLIN  HFA) 108 (90 Base) MCG/ACT inhaler, Inhale 2 puffs into the lungs every 6 (six) hours as needed for wheezing or shortness of breath., Disp: 17 each, Rfl: 0   benzonatate  (TESSALON ) 100 MG capsule, Take 1-2 capsules (100-200 mg total) by mouth 3 (three) times daily as needed for cough., Disp: 30 capsule, Rfl: 0   doxycycline  (VIBRA -TABS) 100 MG tablet, Take 1 tablet  (100 mg total) by mouth 2 (two) times daily for 7 days., Disp: 14 tablet, Rfl: 0        Objective:        BP 120/76 (BP Location: Left Arm, Patient Position: Sitting, Cuff Size: Large)   Pulse (!) 101   Temp 98 F (36.7 C) (Temporal)   Ht 5' 11 (1.803 m)   Wt 213 lb 3.2 oz (96.7 kg)   SpO2 96%   BMI 29.74 kg/m   Physical Exam HEENT: Throat erythematous. CHEST: Wheezing heard on auscultation of the chest. No pneumonia or abnormal sounds in the lungs.  Wt Readings from Last 3 Encounters:  05/22/24 213 lb 3.2 oz (96.7 kg)  07/31/23 210 lb (95.3 kg)  07/23/23 211 lb (95.7 kg)    Physical Exam Vitals reviewed.  Constitutional:      General: He is not in acute distress.    Appearance: Normal appearance. He is obese. He is not ill-appearing, toxic-appearing or diaphoretic.  HENT:     Head: Normocephalic.     Right Ear: Tympanic membrane normal.     Left Ear: Tympanic membrane normal.     Nose: Nose normal.     Mouth/Throat:     Mouth: Mucous membranes are moist.  Eyes:     Pupils: Pupils are equal, round, and reactive to light.  Cardiovascular:     Rate and Rhythm: Normal rate and regular rhythm.  Pulmonary:     Effort: Pulmonary effort is normal.     Breath sounds: Examination of the right-upper field reveals wheezing. Wheezing present.     Comments: Right upper lobe expiratory wheezing however fleeting  Musculoskeletal:        General: Normal range of motion.     Cervical back: Normal range of motion.  Neurological:     General: No focal deficit present.     Mental Status: He is alert and oriented to person, place, and time. Mental status is at baseline.  Psychiatric:        Mood and Affect: Mood normal.        Behavior: Behavior normal.        Thought Content: Thought content normal.        Judgment: Judgment normal.          Results LABS COVID-19 test: Negative (05/22/2024) Streptococcal pharyngitis test: Negative (05/22/2024)  Assessment &  Plan:   Assessment and Plan Assessment & Plan Acute viral lower respiratory infection with wheezing Acute viral lower respiratory infection with wheezing, likely viral in etiology given negative COVID and strep tests. Symptoms include cough, wheezing, shortness of breath, and chest tightness. Wheezing likely due to airway obstruction, possibly exacerbated by reactive airway disease. No signs of pneumonia on examination. Symptoms expected to peak around day 3-5 and improve by day 6-7. - Prescribed prednisone , 2 tablets once daily for 5 days. - Prescribed albuterol  inhaler for use as needed for shortness of breath or severe cough. - Advised continuation of  over-the-counter medications like Nyquil and DayQuil. - Instructed to monitor symptoms and contact if not improving by day 6-7.  Acute pharyngitis Red throat and sore throat starting yesterday. No pain on swallowing, but occasional discomfort. Likely viral given negative strep test. - Continue supportive care with over-the-counter medications.        Return for f/u PCP if no improvement in symptoms.     Ginger Patrick, MSN, APRN, FNP-C [facility]

## 2024-05-22 NOTE — Telephone Encounter (Signed)
 Patient was seen by Tabitha today. She is not in the office please advise

## 2024-05-26 ENCOUNTER — Encounter: Payer: Self-pay | Admitting: Family

## 2024-05-26 ENCOUNTER — Ambulatory Visit: Payer: Self-pay

## 2024-05-26 ENCOUNTER — Telehealth: Admitting: Family Medicine

## 2024-05-26 DIAGNOSIS — B9689 Other specified bacterial agents as the cause of diseases classified elsewhere: Secondary | ICD-10-CM

## 2024-05-26 DIAGNOSIS — J208 Acute bronchitis due to other specified organisms: Secondary | ICD-10-CM | POA: Diagnosis not present

## 2024-05-26 MED ORDER — DOXYCYCLINE HYCLATE 100 MG PO TABS: 100.0000 mg | ORAL_TABLET | Freq: Two times a day (BID) | ORAL | 0 refills | Status: AC

## 2024-05-26 MED ORDER — BENZONATATE 100 MG PO CAPS: 100.0000 mg | ORAL_CAPSULE | Freq: Three times a day (TID) | ORAL | 0 refills | Status: AC | PRN

## 2024-05-26 NOTE — Telephone Encounter (Addendum)
 Noted. Seen virtually today treated with doxycycline , tessalon  perls Plz call tomorrow or Thursday for update on symptoms after starting doxycycline  antibiotic.  If not improving, low threshold for in-person evaluation.

## 2024-05-26 NOTE — Telephone Encounter (Signed)
 FYI Only or Action Required?: FYI only for provider: appointment scheduled on 12.9.25 virtual UC.  Patient was last seen in primary care on 05/22/2024 by Corwin Antu, FNP.  Called Nurse Triage reporting Cough.  Symptoms began a week ago.  Interventions attempted: OTC medications: cough meds and Prescription medications: prednisone , albuterol  inhaler.  Symptoms are: gradually worsening.  Triage Disposition: See HCP Within 4 Hours (Or PCP Triage)  Patient/caregiver understands and will follow disposition?: Yes    Copied from CRM #[id number]. Topic: Clinical - Red Word Triage >> May 26, 2024  9:20 AM Ahlexyia S wrote: Red Word that prompted transfer to Nurse Triage: Pt called in stating he was seen last week and was diagnosed with bronchitis. Pt was prescribed medication predniSONE  (DELTASONE ) 20 MG tablet. Pt was told that if he isnt feeling better he should call in. Pt stated he is currently experiencing shortness of breath, wheezing, chest congestion and worsening cough. Pt denied having any other symptoms. Warm transferred to nurse triage. Reason for Disposition  [1] MILD difficulty breathing (e.g., minimal/no SOB at rest, SOB with walking, pulse < 100) AND [2] still present when not coughing  Answer Assessment - Initial Assessment Questions Was seen last week, prescribed prednisone  and inhaler. He states first two days felt like it was getting better but since then it's gotten worse and he feels like it's settling in his chest more. He has not been able to cough anything up the last 2 days. He states inhaler doesn't help him at all. States he is still congestions, wheezing and coughing. States he feels with any movement that he started breathing harder which then makes him cough, which then makes the shortness of breath worse and he starts breathing harder and it's a cycle. He denies any chest pain, states its feels heavy, worse with a deep breath. Low grade fever over the weekend none now.  He states he always needs steroid AND antibiotic to get rid of these, every time they've tried one without the other, it hasn't worked.     1. ONSET: When did the cough begin?      Over a week 2. SEVERITY: How bad is the cough today?      Mod to severe 3. SPUTUM: Describe the color of your sputum (e.g., none, dry cough; clear, white, yellow, green)     no 4. HEMOPTYSIS: Are you coughing up any blood? If Yes, ask: How much? (e.g., flecks, streaks, tablespoons, etc.)     Pt states coughing nothing up 5. DIFFICULTY BREATHING: Are you having difficulty breathing? If Yes, ask: How bad is it? (e.g., mild, moderate, severe)      Shortness of breath with exertion and coughing 6. FEVER: Do you have a fever? If Yes, ask: What is your temperature, how was it measured, and when did it start?     States low grad over the weekend 7. CARDIAC HISTORY: Do you have any history of heart disease? (e.g., heart attack, congestive heart failure)      no 8. LUNG HISTORY: Do you have any history of lung disease?  (e.g., pulmonary embolus, asthma, emphysema)     no 9. OTHER SYMPTOMS: Do you have any other symptoms? (e.g., runny nose, wheezing, chest pain)       Wheezing, chest heaviness worse with deep breath.  Protocols used: Cough - Acute Non-Productive-A-AH

## 2024-05-26 NOTE — Progress Notes (Signed)
 Virtual Visit Consent   Mark Martinez, you are scheduled for a virtual visit with a [facility] provider today. Just as with appointments in the office, your consent must be obtained to participate. Your consent will be active for this visit and any virtual visit you may have with one of our providers in the next 365 days. If you have a MyChart account, a copy of this consent can be sent to you electronically.  As this is a virtual visit, video technology does not allow for your provider to perform a traditional examination. This may limit your provider's ability to fully assess your condition. If your provider identifies any concerns that need to be evaluated in person or the need to arrange testing (such as labs, EKG, etc.), we will make arrangements to do so. Although advances in technology are sophisticated, we cannot ensure that it will always work on either your end or our end. If the connection with a video visit is poor, the visit may have to be switched to a telephone visit. With either a video or telephone visit, we are not always able to ensure that we have a secure connection.  By engaging in this virtual visit, you consent to the provision of healthcare and authorize for your insurance to be billed (if applicable) for the services provided during this visit. Depending on your insurance coverage, you may receive a charge related to this service.  I need to obtain your verbal consent now. Are you willing to proceed with your visit today? Mark Martinez has provided verbal consent on 05/26/2024 for a virtual visit (video or telephone). [name], NP  Date: 05/26/2024 12:49 PM   Virtual Visit via Video Note   I, [name], connected with  Mark Martinez  ([id number], 1951/01/16) on 05/26/24 at 12:45 PM EST by a video-enabled telemedicine application and verified that I am speaking with the correct person using two identifiers.  Location: Patient: Virtual Visit Location Patient:  Home Provider: Virtual Visit Location Provider: Home Office   I discussed the limitations of evaluation and management by telemedicine and the availability of in person appointments. The patient expressed understanding and agreed to proceed.    History of Present Illness: Mark Martinez is a 73 y.o. who identifies as a male who was assigned male at birth, and is being seen today for cough  Onset was a week back- was given prednisone  by PCP to help with bronchitis. Report prednisone  was helping him some the first two days, but he has not improved over the last week- started to have shortness of breath, and cough- is dry now- but reports he was getting some out at start of prednisone  but it has stopped. Chest is tight as well. Has history bronchitis yearly.  Associated symptoms are temp 99.4 Modifying factors are prednisone  and using mucinex  pill and ny and dayquil, inhaler as well  Denies chest pain, sore throat, eat pain, headache    Problems:  Patient Active Problem List   Diagnosis Date Noted   Prostatitis 07/02/2023   Benign enlargement of prostate 04/16/2023   Liver abscess 06/08/2021   Abnormal CT scan, esophagus 09/27/2020   Esophageal thickening 09/09/2020   Lumbar spondylosis 09/09/2020   S/P laparoscopic cholecystectomy 05/10/2020   Impingement syndrome of right shoulder region 10/30/2019   Subacromial bursitis of right shoulder joint 10/30/2019   Advanced directives, counseling/discussion 04/15/2018   Medicare annual wellness visit, initial 04/14/2018   TMJ dysfunction 07/28/2015  Primary osteoarthritis of elbow 07/28/2015   Prediabetes 07/28/2015   Epidermal inclusion cyst 08/12/2013   HLD (hyperlipidemia)    Healthcare maintenance 01/31/2011   GERD (gastroesophageal reflux disease) 11/20/2007   Erectile dysfunction 11/07/2007   PROSTATITIS, HX OF 11/07/2007    Allergies:  Allergies  Allergen Reactions   Codeine  Other (See Comments)    Keeps him awake    Medications:  Current Outpatient Medications:    acetaminophen  (TYLENOL ) 325 MG tablet, Take 650 mg by mouth every 6 (six) hours as needed for moderate pain. , Disp: , Rfl:    albuterol  (PROVENTIL ) (2.5 MG/3ML) 0.083% nebulizer solution, Take 3 mLs (2.5 mg total) by nebulization every 6 (six) hours as needed for wheezing or shortness of breath., Disp: 150 mL, Rfl: 1   albuterol  (VENTOLIN  HFA) 108 (90 Base) MCG/ACT inhaler, Inhale 2 puffs into the lungs every 6 (six) hours as needed for wheezing or shortness of breath., Disp: 17 each, Rfl: 0   cholestyramine  (QUESTRAN ) 4 g packet, Take 1 packet (4 g total) by mouth 2 (two) times daily., Disp: 180 each, Rfl: 4   clobetasol  ointment (TEMOVATE ) 0.05 %, Apply to aa's rash QD-BID PRN. Avoid applying to face, groin, and axilla. Use as directed. Long-term use can cause thinning of the skin., Disp: 45 g, Rfl: 1   esomeprazole  (NEXIUM ) 40 MG capsule, TAKE 1 CAPSULE (40 MG TOTAL) BY MOUTH 2 (TWO) TIMES DAILY BEFORE A MEAL., Disp: 180 capsule, Rfl: 0   Misc Natural Products (OSTEO BI-FLEX JOINT SHIELD PO), Take by mouth daily., Disp: , Rfl:    POTASSIUM PO, Take by mouth daily., Disp: , Rfl:    predniSONE  (DELTASONE ) 20 MG tablet, Take two tablets once daily for five days, Disp: 10 tablet, Rfl: 0   sildenafil  (REVATIO ) 20 MG tablet, TAKE 3 TO 5 TABLETS BY MOUTH EVERY DAY AS NEEDED, Disp: 60 tablet, Rfl: 1   tamsulosin  (FLOMAX ) 0.4 MG CAPS capsule, Take 1 capsule (0.4 mg total) by mouth daily., Disp: 90 capsule, Rfl: 3  Observations/Objective: Patient is well-developed, well-nourished in no acute distress.  Resting comfortably  at home.  Head is normocephalic, atraumatic.  No labored breathing.  Speech is clear and coherent with logical content.  Patient is alert and oriented at baseline.  Cough is present   Assessment and Plan:  1. Acute bacterial bronchitis (Primary)  - doxycycline  (VIBRA -TABS) 100 MG tablet; Take 1 tablet (100 mg total) by mouth  2 (two) times daily for 7 days.  Dispense: 14 tablet; Refill: 0 - benzonatate  (TESSALON ) 100 MG capsule; Take 1-2 capsules (100-200 mg total) by mouth 3 (three) times daily as needed for cough.  Dispense: 30 capsule; Refill: 0  -concern for PNA- has had vaccine he reports, but given worsening symptoms, history- will cover - Take meds as prescribed - Rest voice - Use a cool mist humidifier especially during the winter months when heat dries out the air. - Use saline nose sprays frequently to help soothe nasal passages if they are drying out. - Stay hydrated by drinking plenty of fluids - Keep thermostat turn down low to prevent drying out which can cause a dry cough. - For any cough or congestion- robitussin DM or Delsym as needed - For fever or aches or pains- take tylenol  or ibuprofen  as directed on bottle             * for fevers greater than 101 orally you may alternate ibuprofen  and tylenol  every 3 hours.  If  you do not improve you will need a follow up visit in person.                  Reviewed side effects, risks and benefits of medication.    Patient acknowledged agreement and understanding of the plan.   Past Medical, Surgical, Social History, Allergies, and Medications have been Reviewed.    Follow Up Instructions: I discussed the assessment and treatment plan with the patient. The patient was provided an opportunity to ask questions and all were answered. The patient agreed with the plan and demonstrated an understanding of the instructions.  A copy of instructions were sent to the patient via MyChart unless otherwise noted below.    The patient was advised to call back or seek an in-person evaluation if the symptoms worsen or if the condition fails to improve as anticipated.    [name], NP

## 2024-05-26 NOTE — Patient Instructions (Addendum)
 Mark Martinez, thank you for joining [name], NP for today's virtual visit.  While this provider is not your primary care provider (PCP), if your PCP is located in our provider database this encounter information will be shared with them immediately following your visit.   A [facility] MyChart account gives you access to today's visit and all your visits, tests, and labs performed at [facility]  click here if you don't have a [facility] MyChart account or go to [email or web address]  Consent: (Patient) Mark Martinez provided verbal consent for this virtual visit at the beginning of the encounter.  Current Medications:  Current Outpatient Medications:    benzonatate  (TESSALON ) 100 MG capsule, Take 1-2 capsules (100-200 mg total) by mouth 3 (three) times daily as needed for cough., Disp: 30 capsule, Rfl: 0   doxycycline  (VIBRA -TABS) 100 MG tablet, Take 1 tablet (100 mg total) by mouth 2 (two) times daily for 7 days., Disp: 14 tablet, Rfl: 0   acetaminophen  (TYLENOL ) 325 MG tablet, Take 650 mg by mouth every 6 (six) hours as needed for moderate pain. , Disp: , Rfl:    albuterol  (PROVENTIL ) (2.5 MG/3ML) 0.083% nebulizer solution, Take 3 mLs (2.5 mg total) by nebulization every 6 (six) hours as needed for wheezing or shortness of breath., Disp: 150 mL, Rfl: 1   albuterol  (VENTOLIN  HFA) 108 (90 Base) MCG/ACT inhaler, Inhale 2 puffs into the lungs every 6 (six) hours as needed for wheezing or shortness of breath., Disp: 17 each, Rfl: 0   cholestyramine  (QUESTRAN ) 4 g packet, Take 1 packet (4 g total) by mouth 2 (two) times daily., Disp: 180 each, Rfl: 4   clobetasol  ointment (TEMOVATE ) 0.05 %, Apply to aa's rash QD-BID PRN. Avoid applying to face, groin, and axilla. Use as directed. Long-term use can cause thinning of the skin., Disp: 45 g, Rfl: 1   esomeprazole  (NEXIUM ) 40 MG capsule, TAKE 1 CAPSULE (40 MG TOTAL) BY MOUTH 2 (TWO) TIMES DAILY BEFORE A MEAL., Disp: 180  capsule, Rfl: 0   Misc Natural Products (OSTEO BI-FLEX JOINT SHIELD PO), Take by mouth daily., Disp: , Rfl:    POTASSIUM PO, Take by mouth daily., Disp: , Rfl:    predniSONE  (DELTASONE ) 20 MG tablet, Take two tablets once daily for five days, Disp: 10 tablet, Rfl: 0   sildenafil  (REVATIO ) 20 MG tablet, TAKE 3 TO 5 TABLETS BY MOUTH EVERY DAY AS NEEDED, Disp: 60 tablet, Rfl: 1   tamsulosin  (FLOMAX ) 0.4 MG CAPS capsule, Take 1 capsule (0.4 mg total) by mouth daily., Disp: 90 capsule, Rfl: 3   Medications ordered in this encounter:  Meds ordered this encounter  Medications   doxycycline  (VIBRA -TABS) 100 MG tablet    Sig: Take 1 tablet (100 mg total) by mouth 2 (two) times daily for 7 days.    Dispense:  14 tablet    Refill:  0    Supervising Provider:   LAMPTEY, PHILIP O [[id number]]   benzonatate  (TESSALON ) 100 MG capsule    Sig: Take 1-2 capsules (100-200 mg total) by mouth 3 (three) times daily as needed for cough.    Dispense:  30 capsule    Refill:  0    Supervising Provider:   BLAISE ALEENE KIDD [[id number]]     *If you need refills on other medications prior to your next appointment, please contact your pharmacy*  Follow-Up: Call back or seek an in-person evaluation if the symptoms worsen or if the condition fails to improve  as anticipated.  [facility] [phone]  Other Instructions - Take meds as prescribed - Rest voice - Use a cool mist humidifier especially during the winter months when heat dries out the air. - Use saline nose sprays frequently to help soothe nasal passages if they are drying out. - Stay hydrated by drinking plenty of fluids - Keep thermostat turn down low to prevent drying out which can cause a dry cough. - For any cough or congestion- robitussin DM or Delsym as needed - For fever or aches or pains- take tylenol  or ibuprofen  as directed on bottle             * for fevers greater than 101 orally you may alternate ibuprofen  and tylenol  every 3  hours.  If you do not improve you will need a follow up visit in person.   If you have been instructed to have an in-person evaluation today at a local Urgent Care facility, please use the link below. It will take you to a list of all of our available [place] [facility], including address, phone number and hours of operation. Please do not delay care.  [facility]  If you or a family member do not have a primary care provider, use the link below to schedule a visit and establish care. When you choose a [facility] primary care physician or advanced practice provider, you gain a long-term partner in health. Find a Primary Care Provider  Learn more about [facility] in-office and virtual care options: [facility] - Get Care Now

## 2024-05-27 NOTE — Telephone Encounter (Signed)
 Unable to reach pt at this time, left voicemail instructing pt to call back to check in.

## 2024-05-28 ENCOUNTER — Other Ambulatory Visit: Payer: Self-pay | Admitting: Family Medicine

## 2024-05-28 MED ORDER — PREDNISONE 20 MG PO TABS: ORAL_TABLET | ORAL | 0 refills | Status: AC

## 2024-05-28 NOTE — Telephone Encounter (Signed)
 Called patient reviewed all information and repeated back to me. Will call if any questions.  He will pick up meds today. Will start. If no improvement will go to [facility]. If symptoms increase or change will go to UC or ED today.

## 2024-05-28 NOTE — Telephone Encounter (Signed)
 Would restart prednisone  and he needs in person visit. Please schedule or have him go to UC.  Thanks.   To PCP as FYI.

## 2024-05-28 NOTE — Telephone Encounter (Signed)
 Called and spoke to pt. He has been able to take meds as prescribed however he stated he feels yucky and sees little to no improvement. Pt expresses frustration over medication not working and said that in the past he had to take prednisone  and antibiotics at same time to see improvement which he is not currently able to do as he is out of prednisone .   Please advise

## 2024-07-09 ENCOUNTER — Other Ambulatory Visit: Payer: Self-pay | Admitting: Physician Assistant

## 2024-07-23 ENCOUNTER — Ambulatory Visit: Payer: Medicare HMO

## 2024-07-24 ENCOUNTER — Ambulatory Visit

## 2024-07-24 VITALS — BP 120/80 | HR 78 | Ht 70.5 in | Wt 216.2 lb

## 2024-07-24 DIAGNOSIS — Z0000 Encounter for general adult medical examination without abnormal findings: Secondary | ICD-10-CM

## 2024-07-24 NOTE — Progress Notes (Signed)
 "  Chief Complaint  Patient presents with   Medicare Wellness     Subjective:   Mark Martinez is a 74 y.o. male who presents for a Medicare Annual Wellness Visit.  Visit info / Clinical Intake: Medicare Wellness Visit Type:: Subsequent Annual Wellness Visit Persons participating in visit and providing information:: patient Medicare Wellness Visit Mode:: In-person (required for WTM) Interpreter Needed?: No Pre-visit prep was completed: yes AWV questionnaire completed by patient prior to visit?: no Living arrangements:: lives with spouse/significant other Patient's Overall Health Status Rating: good Typical amount of pain: none Does pain affect daily life?: no Are you currently prescribed opioids?: no  Dietary Habits and Nutritional Risks How many meals a day?: 3 Eats fruit and vegetables daily?: yes Most meals are obtained by: preparing own meals; eating out In the last 2 weeks, have you had any of the following?: none Diabetic:: no  Functional Status Activities of Daily Living (to include ambulation/medication): Independent Ambulation: Independent with device- listed below Home Assistive Devices/Equipment: Nebulizers; Eyeglasses Medication Administration: Independent Home Management (perform basic housework or laundry): Independent Manage your own finances?: yes Primary transportation is: driving Concerns about vision?: no *vision screening is required for WTM* Concerns about hearing?: no  Fall Screening Falls in the past year?: 0 Number of falls in past year: 0 Was there an injury with Fall?: 0 Fall Risk Category Calculator: 0 Patient Fall Risk Level: Low Fall Risk  Fall Risk Patient at Risk for Falls Due to: No Fall Risks Fall risk Follow up: Falls evaluation completed; Falls prevention discussed  Home and Transportation Safety: All rugs have non-skid backing?: N/A, no rugs All stairs or steps have railings?: (!) no (outside) Grab bars in the bathtub or  shower?: (!) no Have non-skid surface in bathtub or shower?: yes Good home lighting?: yes Regular seat belt use?: yes Hospital stays in the last year:: no  Cognitive Assessment Difficulty concentrating, remembering, or making decisions? : no Will 6CIT or Mini Cog be Completed: yes What year is it?: 0 points What month is it?: 0 points Give patient an address phrase to remember (5 components): [address] [place], [place] About what time is it?: 0 points Count backwards from 20 to 1: 0 points Say the months of the year in reverse: 0 points Repeat the address phrase from earlier: 0 points (Drive) 6 CIT Score: 0 points  Advance Directives (For Healthcare) Does Patient Have a Medical Advance Directive?: Yes Does patient want to make changes to medical advance directive?: Yes (Inpatient - patient requests chaplain consult to change a medical advance directive) Type of Advance Directive: Healthcare Power of [place]; Living will Copy of Healthcare Power of Attorney in Chart?: No - copy requested Copy of Living Will in Chart?: No - copy requested  Reviewed/Updated  Reviewed/Updated: Reviewed All (Medical, Surgical, Family, Medications, Allergies, Care Teams, Patient Goals)    Allergies (verified) Codeine    Current Medications (verified) Outpatient Encounter Medications as of 07/24/2024  Medication Sig   acetaminophen  (TYLENOL ) 325 MG tablet Take 650 mg by mouth every 6 (six) hours as needed for moderate pain.    albuterol  (PROVENTIL ) (2.5 MG/3ML) 0.083% nebulizer solution Take 3 mLs (2.5 mg total) by nebulization every 6 (six) hours as needed for wheezing or shortness of breath.   albuterol  (VENTOLIN  HFA) 108 (90 Base) MCG/ACT inhaler Inhale 2 puffs into the lungs every 6 (six) hours as needed for wheezing or shortness of breath.   benzonatate  (TESSALON ) 100 MG capsule Take 1-2 capsules (  100-200 mg total) by mouth 3 (three) times daily as needed for cough.   cholestyramine  (QUESTRAN ) 4  g packet Take 1 packet (4 g total) by mouth 2 (two) times daily.   clobetasol  ointment (TEMOVATE ) 0.05 % Apply to aa's rash QD-BID PRN. Avoid applying to face, groin, and axilla. Use as directed. Long-term use can cause thinning of the skin.   esomeprazole  (NEXIUM ) 40 MG capsule TAKE 1 CAPSULE (40 MG TOTAL) BY MOUTH 2 (TWO) TIMES DAILY BEFORE A MEAL.   Misc Natural Products (OSTEO BI-FLEX JOINT SHIELD PO) Take by mouth daily.   POTASSIUM PO Take by mouth daily.   sildenafil  (REVATIO ) 20 MG tablet TAKE 3 TO 5 TABLETS BY MOUTH EVERY DAY AS NEEDED   tamsulosin  (FLOMAX ) 0.4 MG CAPS capsule Take 1 capsule (0.4 mg total) by mouth daily.   predniSONE  (DELTASONE ) 20 MG tablet Take two tablets once daily for five days (Patient not taking: Reported on 07/24/2024)   No facility-administered encounter medications on file as of 07/24/2024.    History: Past Medical History:  Diagnosis Date   Cataract    COVID-19 virus infection 06/2019   also 2024   ED (erectile dysfunction)    Elevated LFTs    Esophageal stricture    GERD (gastroesophageal reflux disease)    with hiatal hernia   H/O hiatal hernia    History of prostatitis    HLD (hyperlipidemia)    Prediabetes 07/28/2015   Shortness of breath    exersion   Tubular adenoma of colon    Past Surgical History:  Procedure Laterality Date   BACK SURGERY  1994   lumbar area   CHOLECYSTECTOMY     COLONOSCOPY  2005   WNL, rpt 10 yrs   COLONOSCOPY  07/2019   1 TA, hemorrhoids, rpt 7 yrs Marianne)   CYST REMOVAL NECK Left 08/31/2013   Procedure: EXCISION EPIDERMAL INCLUSION CYST LEFT NECK;  Surgeon: Dann FORBES Hummer, MD   ESOPHAGEAL DILATION     three last   ESOPHAGOGASTRODUODENOSCOPY  12-27-1996   hiatal hernia (Dr. Jakie)   SHOULDER ARTHROSCOPY WITH ROTATOR CUFF REPAIR AND SUBACROMIAL DECOMPRESSION Right 01/2020   distal clavicle resection, RTC repair (Supple)   Family History  Problem Relation Age of Onset   Valvular heart disease Mother     Ulcers Father    CAD Father 33       multiple stents   CAD Paternal Uncle    Cancer Neg Hx    Diabetes Neg Hx    Stroke Neg Hx    Colon cancer Neg Hx    Esophageal cancer Neg Hx    Pancreatic cancer Neg Hx    Liver disease Neg Hx    Stomach cancer Neg Hx    Social History   Occupational History   Occupation: seeding and Surveyor, Mining: OTHER  Tobacco Use   Smoking status: Never    Passive exposure: Never   Smokeless tobacco: Former    Quit date: 01/30/2009  Vaping Use   Vaping status: Never Used  Substance and Sexual Activity   Alcohol use: No   Drug use: No   Sexual activity: Yes   Tobacco Counseling Counseling given: No  SDOH Screenings   Food Insecurity: No Food Insecurity (07/24/2024)  Housing: Low Risk (07/24/2024)  Transportation Needs: No Transportation Needs (07/24/2024)  Utilities: Not At Risk (07/24/2024)  Alcohol Screen: Low Risk (07/23/2023)  Depression (PHQ2-9): Low Risk (07/24/2024)  Financial Resource Strain: Low Risk (  07/23/2023)  Physical Activity: Inactive (07/24/2024)  Social Connections: Socially Integrated (07/24/2024)  Stress: No Stress Concern Present (07/24/2024)  Tobacco Use: Medium Risk (07/24/2024)  Health Literacy: Adequate Health Literacy (07/24/2024)   See flowsheets for full screening details  Depression Screen PHQ 2 & 9 Depression Scale- Over the past 2 weeks, how often have you been bothered by any of the following problems? Little interest or pleasure in doing things: 0 Feeling down, depressed, or hopeless (PHQ Adolescent also includes...irritable): 0 PHQ-2 Total Score: 0 Trouble falling or staying asleep, or sleeping too much: 0 (gets about 6hrs) Feeling tired or having little energy: 0 Poor appetite or overeating (PHQ Adolescent also includes...weight loss): 0 Feeling bad about yourself - or that you are a failure or have let yourself or your family down: 0 Trouble concentrating on things, such as reading the newspaper or  watching television (PHQ Adolescent also includes...like school work): 0 Moving or speaking so slowly that other people could have noticed. Or the opposite - being so fidgety or restless that you have been moving around a lot more than usual: 0 Thoughts that you would be better off dead, or of hurting yourself in some way: 0 PHQ-9 Total Score: 0 If you checked off any problems, how difficult have these problems made it for you to do your work, take care of things at home, or get along with other people?: Not difficult at all  Depression Treatment Depression Interventions/Treatment : EYV7-0 Score <4 Follow-up Not Indicated     Goals Addressed               This Visit's Progress     Patient Stated (pt-stated)        Patient stated he plans to stay active and helping the community             Objective:    Today's Vitals   07/24/24 0924  BP: 120/80  Pulse: 78  SpO2: 98%  Weight: 216 lb 3.2 oz (98.1 kg)  Height: 5' 10.5 (1.791 m)   Body mass index is 30.58 kg/m.  Hearing/Vision screen Hearing Screening - Comments:: Denies hearing difficulties   Vision Screening - Comments:: Wears rx glasses - up to date with routine eye exams with [facility] Immunizations and Health Maintenance Health Maintenance  Topic Date Due   Zoster Vaccines- Shingrix (1 of 2) Never done   COVID-19 Vaccine (4 - 2025-26 season) 02/17/2024   Medicare Annual Wellness (AWV)  07/24/2025   Colonoscopy  07/28/2026   DTaP/Tdap/Td (4 - Td or Tdap) 05/26/2029   Pneumococcal Vaccine: 50+ Years  Completed   Influenza Vaccine  Completed   Hepatitis C Screening  Completed   Meningococcal B Vaccine  Aged Out        Assessment/Plan:  This is a routine wellness examination for [place].  Patient Care Team: Rilla Baller, MD as PCP - General Camillo Golas, MD as Attending Physician (Ophthalmology) Claudene Lehmann, MD as Referring Physician (Dermatology)  I have personally reviewed and  noted the following in the patients chart:   Medical and social history Use of alcohol, tobacco or illicit drugs  Current medications and supplements including opioid prescriptions. Functional ability and status Nutritional status Physical activity Advanced directives List of other physicians Hospitalizations, surgeries, and ER visits in previous 12 months Vitals Screenings to include cognitive, depression, and falls Referrals and appointments  No orders of the defined types were placed in this encounter.  In addition, I have reviewed and discussed  with patient certain preventive protocols, quality metrics, and best practice recommendations. A written personalized care plan for preventive services as well as general preventive health recommendations were provided to patient.   Verdie CHRISTELLA Saba, CMA   07/24/2024   Return 08/04/2025 at 8:10a, for AWV - Annual Wellness Visit and Physical.  After Visit Summary: (In Person-Declined) Patient declined AVS at this time.  Nurse Notes: scheduled 2027 AWV appt "

## 2024-07-24 NOTE — Patient Instructions (Addendum)
 Mark Martinez,  Thank you for taking the time for your Medicare Wellness Visit. I appreciate your continued commitment to your health goals. Please review the care plan we discussed, and feel free to reach out if I can assist you further.  Please note that Annual Wellness Visits do not include a physical exam. Some assessments may be limited, especially if the visit was conducted virtually. If needed, we may recommend an in-person follow-up with your provider.  Ongoing Care Seeing your primary care provider every 3 to 6 months helps us  monitor your health and provide consistent, personalized care.   Referrals If a referral was made during today's visit and you haven't received any updates within two weeks, please contact the referred provider directly to check on the status.  Recommended Screenings:  Health Maintenance  Topic Date Due   Zoster (Shingles) Vaccine (1 of 2) Never done   COVID-19 Vaccine (4 - 2025-26 season) 02/17/2024   Medicare Annual Wellness Visit  07/24/2025   Colon Cancer Screening  07/28/2026   DTaP/Tdap/Td vaccine (4 - Td or Tdap) 05/26/2029   Pneumococcal Vaccine for age over 53  Completed   Flu Shot  Completed   Hepatitis C Screening  Completed   Meningitis B Vaccine  Aged Out       07/24/2024    9:24 AM  Advanced Directives  Does Patient Have a Medical Advance Directive? Yes  Type of Estate Agent of [place];Living will  Does patient want to make changes to medical advance directive? Yes (Inpatient - patient requests chaplain consult to change a medical advance directive)  Copy of Healthcare Power of Attorney in Chart? No - copy requested    Vision: Annual vision screenings are recommended for early detection of glaucoma, cataracts, and diabetic retinopathy. These exams can also reveal signs of chronic conditions such as diabetes and high blood pressure.  Dental: Annual dental screenings help detect early signs of oral cancer, gum  disease, and other conditions linked to overall health, including heart disease and diabetes.

## 2024-07-27 ENCOUNTER — Other Ambulatory Visit: Payer: Medicare HMO

## 2024-08-03 ENCOUNTER — Encounter: Payer: Medicare HMO | Admitting: Family Medicine

## 2025-04-29 ENCOUNTER — Ambulatory Visit: Admitting: Dermatology

## 2025-08-04 ENCOUNTER — Encounter: Admitting: Family Medicine

## 2025-08-04 ENCOUNTER — Ambulatory Visit
